# Patient Record
Sex: Female | Born: 1985 | Race: Black or African American | Hispanic: No | Marital: Single | Smoking: Light tobacco smoker
Health system: Southern US, Community
[De-identification: ages and names within clinical notes are randomized; demographics above are authoritative.]

## PROBLEM LIST (undated history)

## (undated) ENCOUNTER — Inpatient Hospital Stay (HOSPITAL_COMMUNITY): Payer: Self-pay

## (undated) DIAGNOSIS — D219 Benign neoplasm of connective and other soft tissue, unspecified: Secondary | ICD-10-CM

## (undated) DIAGNOSIS — J45909 Unspecified asthma, uncomplicated: Secondary | ICD-10-CM

## (undated) DIAGNOSIS — N809 Endometriosis, unspecified: Secondary | ICD-10-CM

## (undated) DIAGNOSIS — D279 Benign neoplasm of unspecified ovary: Secondary | ICD-10-CM

## (undated) DIAGNOSIS — L309 Dermatitis, unspecified: Secondary | ICD-10-CM

## (undated) HISTORY — DX: Endometriosis, unspecified: N80.9

## (undated) HISTORY — DX: Benign neoplasm of connective and other soft tissue, unspecified: D21.9

## (undated) HISTORY — DX: Unspecified asthma, uncomplicated: J45.909

## (undated) HISTORY — PX: OVARIAN CYST REMOVAL: SHX89

## (undated) HISTORY — DX: Benign neoplasm of unspecified ovary: D27.9

## (undated) HISTORY — PX: WISDOM TOOTH EXTRACTION: SHX21

---
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
## 2006-05-23 DIAGNOSIS — D279 Benign neoplasm of unspecified ovary: Secondary | ICD-10-CM

## 2006-05-23 HISTORY — DX: Benign neoplasm of unspecified ovary: D27.9

## 2006-05-23 HISTORY — PX: OVARIAN CYST SURGERY: SHX726

## 2011-07-11 ENCOUNTER — Emergency Department (HOSPITAL_COMMUNITY)
Admission: EM | Admit: 2011-07-11 | Discharge: 2011-07-11 | Disposition: A | Discharge status: Home / Self Care | Payer: Self-pay | Attending: Emergency Medicine | Admitting: Emergency Medicine

## 2011-07-11 ENCOUNTER — Encounter (HOSPITAL_COMMUNITY): Payer: Self-pay | Admitting: Emergency Medicine

## 2011-07-11 DIAGNOSIS — J45909 Unspecified asthma, uncomplicated: Secondary | ICD-10-CM | POA: Insufficient documentation

## 2011-07-11 DIAGNOSIS — F17200 Nicotine dependence, unspecified, uncomplicated: Secondary | ICD-10-CM | POA: Insufficient documentation

## 2011-07-11 DIAGNOSIS — R112 Nausea with vomiting, unspecified: Secondary | ICD-10-CM | POA: Insufficient documentation

## 2011-07-11 LAB — URINALYSIS, ROUTINE W REFLEX MICROSCOPIC
Ketones, ur: NEGATIVE mg/dL
Protein, ur: NEGATIVE mg/dL
Urobilinogen, UA: 0.2 mg/dL (ref 0.0–1.0)

## 2011-07-11 LAB — URINE MICROSCOPIC-ADD ON

## 2011-07-11 LAB — PREGNANCY, URINE: Preg Test, Ur: NEGATIVE

## 2011-07-11 MED ORDER — ONDANSETRON 8 MG PO TBDP: 8.0000 mg | ORAL_TABLET | Freq: Three times a day (TID) | ORAL | Status: AC | PRN

## 2011-07-11 MED ORDER — PROMETHAZINE HCL 25 MG PO TABS: 25.0000 mg | ORAL_TABLET | Freq: Four times a day (QID) | ORAL | Status: AC | PRN

## 2011-07-11 MED ORDER — SODIUM CHLORIDE 0.9 % IV BOLUS (SEPSIS)
1000.0000 mL | Freq: Once | INTRAVENOUS | Status: AC
  Administered 2011-07-11: 1000 mL via INTRAVENOUS

## 2011-07-11 MED ORDER — ONDANSETRON HCL 4 MG/2ML IJ SOLN
4.0000 mg | Freq: Once | INTRAMUSCULAR | Status: AC
  Administered 2011-07-11: 4 mg via INTRAVENOUS
  Filled 2011-07-11: qty 2

## 2011-07-11 NOTE — ED Provider Notes (Signed)
History     CSN: [id number]  Arrival date & time 07/11/11  8295   First MD Initiated Contact with Patient 07/11/11 [phone]      Chief Complaint  Patient presents with  . Emesis    (Consider location/radiation/quality/duration/timing/severity/associated sxs/prior treatment) Patient is a 26 y.o. female presenting with vomiting. The history is provided by the patient.  Emesis  This is a new problem. Associated symptoms include abdominal pain. Pertinent negatives include no diarrhea and no headaches.   patient had nausea and vomiting it began last night. No diarrhea. Upper abdominal pain. She states she drank one drink last night. No fevers. No recent sick contacts. No fevers. When asked if she could be pregnant she stated "I hope not". She states she's had one episode like this in the past, but resolved on its own. No difficulty breathing. No headache. No confusion.  Past Medical History  Diagnosis Date  . Asthma     No past surgical history on file.  No family history on file.  History  Substance Use Topics  . Smoking status: Current Everyday Smoker -- 0.5 packs/day    Types: Cigarettes  . Smokeless tobacco: Not on file  . Alcohol Use: Yes    OB History    Grav Para Term Preterm Abortions TAB SAB Ect Mult Living                  Review of Systems  Constitutional: Negative for activity change and appetite change.  HENT: Negative for neck stiffness.   Eyes: Negative for pain.  Respiratory: Negative for chest tightness and shortness of breath.   Cardiovascular: Negative for chest pain and leg swelling.  Gastrointestinal: Positive for nausea, vomiting and abdominal pain. Negative for diarrhea.  Genitourinary: Negative for flank pain.  Musculoskeletal: Negative for back pain.  Skin: Negative for rash.  Neurological: Negative for weakness, numbness and headaches.  Psychiatric/Behavioral: Negative for behavioral problems.    Allergies  Peanut-containing drug  products  Home Medications   Current Outpatient Rx  Name Route Sig Dispense Refill  . ONDANSETRON 8 MG PO TBDP Oral Take 1 tablet (8 mg total) by mouth every 8 (eight) hours as needed for nausea. 10 tablet 0  . PROMETHAZINE HCL 25 MG PO TABS Oral Take 1 tablet (25 mg total) by mouth every 6 (six) hours as needed for nausea. 20 tablet 0    BP 110/67  Pulse 53  Temp(Src) 97 F (36.1 C) (Oral)  Resp 16  SpO2 100%  LMP 07/11/2011  Physical Exam  Nursing note and vitals reviewed. Constitutional: She is oriented to person, place, and time. She appears well-developed and well-nourished.  HENT:  Head: Normocephalic.  Neck: Normal range of motion. Neck supple.  Cardiovascular: Normal rate, regular rhythm and normal heart sounds.   No murmur heard. Pulmonary/Chest: Effort normal and breath sounds normal. No respiratory distress. She has no wheezes. She has no rales.  Abdominal: Soft. Bowel sounds are normal. She exhibits no distension. There is no tenderness. There is no rebound and no guarding.       Patient is actively vomiting  Musculoskeletal: Normal range of motion.  Neurological: She is alert and oriented to person, place, and time. No cranial nerve deficit.  Skin: Skin is warm and dry.  Psychiatric: She has a normal mood and affect. Her speech is normal.    ED Course  Procedures (including critical care time)  Labs Reviewed  URINALYSIS, ROUTINE W REFLEX MICROSCOPIC - Abnormal; Notable for  the following:    APPearance TURBID (*)    Hgb urine dipstick LARGE (*)    Leukocytes, UA SMALL (*)    All other components within normal limits  URINE MICROSCOPIC-ADD ON - Abnormal; Notable for the following:    Squamous Epithelial / LPF MANY (*)    Bacteria, UA FEW (*)    All other components within normal limits  PREGNANCY, URINE   No results found.   1. Nausea and vomiting       MDM  Nausea vomiting. Patient feels better after IV fluids and Zofran. She's tolerated orals  here. She's not pregnant. 61 onset.electrolyte abnormalities at this time. She be discharged home        Juliet Rude. Rubin Payor, MD 07/11/11 1141

## 2011-07-11 NOTE — ED Notes (Signed)
Patient states feeling much better.  Patient denies pain/nausea at this time.

## 2011-07-11 NOTE — ED Notes (Signed)
Patient states started throwing up last night.

## 2011-07-11 NOTE — ED Notes (Signed)
Patient ambulated to restroom.  Patient tolerated well.  Patient states cannot provide a urine specimen at this time.

## 2011-07-11 NOTE — Discharge Instructions (Signed)
Nausea and Vomiting Nausea is a sick feeling that often comes before throwing up (vomiting). Vomiting is a reflex where stomach contents come out of your mouth. Vomiting can cause severe loss of body fluids (dehydration). Children and elderly adults can become dehydrated quickly, especially if they also have diarrhea. Nausea and vomiting are symptoms of a condition or disease. It is important to find the cause of your symptoms. CAUSES   Direct irritation of the stomach lining. This irritation can result from increased acid production (gastroesophageal reflux disease), infection, food poisoning, taking certain medicines (such as nonsteroidal anti-inflammatory drugs), alcohol use, or tobacco use.   Signals from the brain.These signals could be caused by a headache, heat exposure, an inner ear disturbance, increased pressure in the brain from injury, infection, a tumor, or a concussion, pain, emotional stimulus, or metabolic problems.   An obstruction in the gastrointestinal tract (bowel obstruction).   Illnesses such as diabetes, hepatitis, gallbladder problems, appendicitis, kidney problems, cancer, sepsis, atypical symptoms of a heart attack, or eating disorders.   Medical treatments such as chemotherapy and radiation.   Receiving medicine that makes you sleep (general anesthetic) during surgery.  DIAGNOSIS Your caregiver may ask for tests to be done if the problems do not improve after a few days. Tests may also be done if symptoms are severe or if the reason for the nausea and vomiting is not clear. Tests may include:  Urine tests.   Blood tests.   Stool tests.   Cultures (to look for evidence of infection).   X-rays or other imaging studies.  Test results can help your caregiver make decisions about treatment or the need for additional tests. TREATMENT You need to stay well hydrated. Drink frequently but in small amounts.You may wish to drink water, sports drinks, clear broth, or  eat frozen ice pops or gelatin dessert to help stay hydrated.When you eat, eating slowly may help prevent nausea.There are also some antinausea medicines that may help prevent nausea. HOME CARE INSTRUCTIONS   Take all medicine as directed by your caregiver.   If you do not have an appetite, do not force yourself to eat. However, you must continue to drink fluids.   If you have an appetite, eat a normal diet unless your caregiver tells you differently.   Eat a variety of complex carbohydrates (rice, wheat, potatoes, bread), lean meats, yogurt, fruits, and vegetables.   Avoid high-fat foods because they are more difficult to digest.   Drink enough water and fluids to keep your urine clear or pale yellow.   If you are dehydrated, ask your caregiver for specific rehydration instructions. Signs of dehydration may include:   Severe thirst.   Dry lips and mouth.   Dizziness.   Dark urine.   Decreasing urine frequency and amount.   Confusion.   Rapid breathing or pulse.  SEEK IMMEDIATE MEDICAL CARE IF:   You have blood or brown flecks (like coffee grounds) in your vomit.   You have black or bloody stools.   You have a severe headache or stiff neck.   You are confused.   You have severe abdominal pain.   You have chest pain or trouble breathing.   You do not urinate at least once every 8 hours.   You develop cold or clammy skin.   You continue to vomit for longer than 24 to 48 hours.   You have a fever.  MAKE SURE YOU:   Understand these instructions.   Will watch your   condition.   Will get help right away if you are not doing well or get worse.  Document Released: 05/09/2005 Document Revised: 01/19/2011 Document Reviewed: 10/06/2010 [employer] Patient Information 2012 [employer]. 

## 2011-09-20 ENCOUNTER — Emergency Department (HOSPITAL_COMMUNITY)
Admission: EM | Admit: 2011-09-20 | Discharge: 2011-09-20 | Disposition: A | Discharge status: Home / Self Care | Payer: Self-pay | Attending: Emergency Medicine | Admitting: Emergency Medicine

## 2011-09-20 ENCOUNTER — Encounter (HOSPITAL_COMMUNITY): Payer: Self-pay | Admitting: *Deleted

## 2011-09-20 DIAGNOSIS — F17200 Nicotine dependence, unspecified, uncomplicated: Secondary | ICD-10-CM | POA: Insufficient documentation

## 2011-09-20 DIAGNOSIS — J309 Allergic rhinitis, unspecified: Secondary | ICD-10-CM | POA: Insufficient documentation

## 2011-09-20 DIAGNOSIS — J45909 Unspecified asthma, uncomplicated: Secondary | ICD-10-CM | POA: Insufficient documentation

## 2011-09-20 DIAGNOSIS — J302 Other seasonal allergic rhinitis: Secondary | ICD-10-CM

## 2011-09-20 MED ORDER — ALBUTEROL SULFATE HFA 108 (90 BASE) MCG/ACT IN AERS: 1.0000 | INHALATION_SPRAY | Freq: Four times a day (QID) | RESPIRATORY_TRACT | Status: DC | PRN

## 2011-09-20 MED ORDER — CETIRIZINE HCL 10 MG PO TABS: 10.0000 mg | ORAL_TABLET | Freq: Every day | ORAL | Status: DC

## 2011-09-20 NOTE — ED Notes (Signed)
Patient developed a raspy voice 2 days ago and chest congestion clear sputum intermittent cough continued today. Airway intact bilateral equal chest rise and fall.  No distress noted currently sitting resting comfortably on stretcher. States has albuterol inhaler however ran out.

## 2011-09-20 NOTE — ED Provider Notes (Signed)
Medical screening examination/treatment/procedure(s) were performed by non-physician practitioner and as supervising physician I was immediately available for consultation/collaboration.   Glynn Octave, MD 09/20/11 [phone]

## 2011-09-20 NOTE — Discharge Instructions (Signed)
Start taking zyrtec daily and use albuterol inhaler as needed. Rest and stay well hydrated. Establishment with a Primary Care provider is Very important for general health care concerns, minor illness and minor injury and for further evaluation and management of your asthma but return to the ER for changing or worsening of symptoms.   Asthma Attack Prevention HOW CAN ASTHMA BE PREVENTED? Currently, there is no way to prevent asthma from starting. However, you can take steps to control the disease and prevent its symptoms after you have been diagnosed. Learn about your asthma and how to control it. Take an active role to control your asthma by working with your caregiver to create and follow an asthma action plan. An asthma action plan guides you in taking your medicines properly, avoiding factors that make your asthma worse, tracking your level of asthma control, responding to worsening asthma, and seeking emergency care when needed. To track your asthma, keep records of your symptoms, check your peak flow number using a peak flow meter (handheld device that shows how well air moves out of your lungs), and get regular asthma checkups.  Other ways to prevent asthma attacks include:  Use medicines as your caregiver directs.   Identify and avoid things that make your asthma worse (as much as you can).   Keep track of your asthma symptoms and level of control.   Get regular checkups for your asthma.   With your caregiver, write a detailed plan for taking medicines and managing an asthma attack. Then be sure to follow your action plan. Asthma is an ongoing condition that needs regular monitoring and treatment.   Identify and avoid asthma triggers. A number of outdoor allergens and irritants (pollen, mold, cold air, air pollution) can trigger asthma attacks. Find out what causes or makes your asthma worse, and take steps to avoid those triggers (see below).   Monitor your breathing. Learn to recognize  warning signs of an attack, such as slight coughing, wheezing or shortness of breath. However, your lung function may already decrease before you notice any signs or symptoms, so regularly measure and record your peak airflow with a home peak flow meter.   Identify and treat attacks early. If you act quickly, you're less likely to have a severe attack. You will also need less medicine to control your symptoms. When your peak flow measurements decrease and alert you to an upcoming attack, take your medicine as instructed, and immediately stop any activity that may have triggered the attack. If your symptoms do not improve, get medical help.   Pay attention to increasing quick-relief inhaler use. If you find yourself relying on your quick-relief inhaler (such as albuterol), your asthma is not under control. See your caregiver about adjusting your treatment.  IDENTIFY AND CONTROL FACTORS THAT MAKE YOUR ASTHMA WORSE A number of common things can set off or make your asthma symptoms worse (asthma triggers). Keep track of your asthma symptoms for several weeks, detailing all the environmental and emotional factors that are linked with your asthma. When you have an asthma attack, go back to your asthma diary to see which factor, or combination of factors, might have contributed to it. Once you know what these factors are, you can take steps to control many of them.  Allergies: If you have allergies and asthma, it is important to take asthma prevention steps at home. Asthma attacks (worsening of asthma symptoms) can be triggered by allergies, which can cause temporary increased inflammation of your airways.  Minimizing contact with the substance to which you are allergic will help prevent an asthma attack. Animal Dander:   Some people are allergic to the flakes of skin or dried saliva from animals with fur or feathers. Keep these pets out of your home.   If you can't keep a pet outdoors, keep the pet out of your  bedroom and other sleeping areas at all times, and keep the door closed.   Remove carpets and furniture covered with cloth from your home. If that is not possible, keep the pet away from fabric-covered furniture and carpets.  Dust Mites:  Many people with asthma are allergic to dust mites. Dust mites are tiny bugs that are found in every home, in mattresses, pillows, carpets, fabric-covered furniture, bedcovers, clothes, stuffed toys, fabric, and other fabric-covered items.   Cover your mattress in a special dust-proof cover.   Cover your pillow in a special dust-proof cover, or wash the pillow each week in hot water. Water must be hotter than 130 F to kill dust mites. Cold or warm water used with detergent and bleach can also be effective.   Wash the sheets and blankets on your bed each week in hot water.   Try not to sleep or lie on cloth-covered cushions.   Call ahead when traveling and ask for a smoke-free hotel room. Bring your own bedding and pillows, in case the hotel only supplies feather pillows and down comforters, which may contain dust mites and cause asthma symptoms.   Remove carpets from your bedroom and those laid on concrete, if you can.   Keep stuffed toys out of the bed, or wash the toys weekly in hot water or cooler water with detergent and bleach.  Cockroaches:  Many people with asthma are allergic to the droppings and remains of cockroaches.   Keep food and garbage in closed containers. Never leave food out.   Use poison baits, traps, powders, gels, or paste (for example, boric acid).   If a spray is used to kill cockroaches, stay out of the room until the odor goes away.  Indoor Mold:  Fix leaky faucets, pipes, or other sources of water that have mold around them.   Clean moldy surfaces with a cleaner that has bleach in it.  Pollen and Outdoor Mold:  When pollen or mold spore counts are high, try to keep your windows closed.   Stay indoors with windows  closed from late morning to afternoon, if you can. Pollen and some mold spore counts are highest at that time.   Ask your caregiver whether you need to take or increase anti-inflammatory medicine before your allergy season starts.  Irritants:   Tobacco smoke is an irritant. If you smoke, ask your caregiver how you can quit. Ask family members to quit smoking, too. Do not allow smoking in your home or car.   If possible, do not use a wood-burning stove, kerosene heater, or fireplace. Minimize exposure to all sources of smoke, including incense, candles, fires, and fireworks.   Try to stay away from strong odors and sprays, such as perfume, talcum powder, hair spray, and paints.   Decrease humidity in your home and use an indoor air cleaning device. Reduce indoor humidity to below 60 percent. Dehumidifiers or central air conditioners can do this.   Try to have someone else vacuum for you once or twice a week, if you can. Stay out of rooms while they are being vacuumed and for a short while afterward.  If you vacuum, use a dust mask from a hardware store, a double-layered or microfilter vacuum cleaner bag, or a vacuum cleaner with a HEPA filter.   Sulfites in foods and beverages can be irritants. Do not drink beer or wine, or eat dried fruit, processed potatoes, or shrimp if they cause asthma symptoms.   Cold air can trigger an asthma attack. Cover your nose and mouth with a scarf on cold or windy days.   Several health conditions can make asthma more difficult to manage, including runny nose, sinus infections, reflux disease, psychological stress, and sleep apnea. Your caregiver will treat these conditions, as well.   Avoid close contact with people who have a cold or the flu, since your asthma symptoms may get worse if you catch the infection from them. Wash your hands thoroughly after touching items that may have been handled by people with a respiratory infection.   Get a flu shot every  year to protect against the flu virus, which often makes asthma worse for days or weeks. Also get a pneumonia shot once every five to 10 years.  Drugs:  Aspirin and other painkillers can cause asthma attacks. 10% to 20% of people with asthma have sensitivity to aspirin or a group of painkillers called non-steroidal anti-inflammatory drugs (NSAIDS), such as ibuprofen and naproxen. These drugs are used to treat pain and reduce fevers. Asthma attacks caused by any of these medicines can be severe and even fatal. These drugs must be avoided in people who have known aspirin sensitive asthma. Products with acetaminophen are considered safe for people who have asthma. It is important that people with aspirin sensitivity read labels of all over-the-counter drugs used to treat pain, colds, coughs, and fever.   Beta blockers and ACE inhibitors are other drugs which you should discuss with your caregiver, in relation to your asthma.  ALLERGY SKIN TESTING  Ask your asthma caregiver about allergy skin testing or blood testing (RAST test) to identify the allergens to which you are sensitive. If you are found to have allergies, allergy shots (immunotherapy) for asthma may help prevent future allergies and asthma. With allergy shots, small doses of allergens (substances to which you are allergic) are injected under your skin on a regular schedule. Over a period of time, your body may become used to the allergen and less responsive with asthma symptoms. You can also take measures to minimize your exposure to those allergens. EXERCISE  If you have exercise-induced asthma, or are planning vigorous exercise, or exercise in cold, humid, or dry environments, prevent exercise-induced asthma by following your caregiver's advice regarding asthma treatment before exercising. Document Released: 04/27/2009 Document Revised: 04/28/2011 Document Reviewed: 04/27/2009 [facility] Patient Information 2012 [place], [place].  Allergies,  Generic Allergies may happen from anything your body is sensitive to. This may be food, medicines, pollens, chemicals, and nearly anything around you in everyday life that produces allergens. An allergen is anything that causes an allergy producing substance. Heredity is often a factor in causing these problems. This means you may have some of the same allergies as your parents. Food allergies happen in all age groups. Food allergies are some of the most severe and life threatening. Some common food allergies are cow's milk, seafood, eggs, nuts, wheat, and soybeans. SYMPTOMS   Swelling around the mouth.   An itchy red rash or hives.   Vomiting or diarrhea.   Difficulty breathing.  SEVERE ALLERGIC REACTIONS ARE LIFE-THREATENING. This reaction is called anaphylaxis. It can cause the  mouth and throat to swell and cause difficulty with breathing and swallowing. In severe reactions only a trace amount of food (for example, peanut oil in a salad) may cause death within seconds. Seasonal allergies occur in all age groups. These are seasonal because they usually occur during the same season every year. They may be a reaction to molds, grass pollens, or tree pollens. Other causes of problems are house dust mite allergens, pet dander, and mold spores. The symptoms often consist of nasal congestion, a runny itchy nose associated with sneezing, and tearing itchy eyes. There is often an associated itching of the mouth and ears. The problems happen when you come in contact with pollens and other allergens. Allergens are the particles in the air that the body reacts to with an allergic reaction. This causes you to release allergic antibodies. Through a chain of events, these eventually cause you to release histamine into the blood stream. Although it is meant to be protective to the body, it is this release that causes your discomfort. This is why you were given anti-histamines to feel better. If you are unable to  pinpoint the offending allergen, it may be determined by skin or blood testing. Allergies cannot be cured but can be controlled with medicine. Hay fever is a collection of all or some of the seasonal allergy problems. It may often be treated with simple over-the-counter medicine such as diphenhydramine. Take medicine as directed. Do not drink alcohol or drive while taking this medicine. Check with your caregiver or package insert for child dosages. If these medicines are not effective, there are many new medicines your caregiver can prescribe. Stronger medicine such as nasal spray, eye drops, and corticosteroids may be used if the first things you try do not work well. Other treatments such as immunotherapy or desensitizing injections can be used if all else fails. Follow up with your caregiver if problems continue. These seasonal allergies are usually not life threatening. They are generally more of a nuisance that can often be handled using medicine. HOME CARE INSTRUCTIONS   If unsure what causes a reaction, keep a diary of foods eaten and symptoms that follow. Avoid foods that cause reactions.   If hives or rash are present:   Take medicine as directed.   You may use an over-the-counter antihistamine (diphenhydramine) for hives and itching as needed.   Apply cold compresses (cloths) to the skin or take baths in cool water. Avoid hot baths or showers. Heat will make a rash and itching worse.   If you are severely allergic:   Following a treatment for a severe reaction, hospitalization is often required for closer follow-up.   Wear a medic-alert bracelet or necklace stating the allergy.   You and your family must learn how to give adrenaline or use an anaphylaxis kit.   If you have had a severe reaction, always carry your anaphylaxis kit or EpiPen with you. Use this medicine as directed by your caregiver if a severe reaction is occurring. Failure to do so could have a fatal outcome.  SEEK  MEDICAL CARE IF:  You suspect a food allergy. Symptoms generally happen within 30 minutes of eating a food.   Your symptoms have not gone away within 2 days or are getting worse.   You develop new symptoms.   You want to retest yourself or your child with a food or drink you think causes an allergic reaction. Never do this if an anaphylactic reaction to that food or  drink has happened before. Only do this under the care of a caregiver.  SEEK IMMEDIATE MEDICAL CARE IF:   You have difficulty breathing, are wheezing, or have a tight feeling in your chest or throat.   You have a swollen mouth, or you have hives, swelling, or itching all over your body.   You have had a severe reaction that has responded to your anaphylaxis kit or an EpiPen. These reactions may return when the medicine has worn off. These reactions should be considered life threatening.  MAKE SURE YOU:   Understand these instructions.   Will watch your condition.   Will get help right away if you are not doing well or get worse.  Document Released: 08/02/2002 Document Revised: 04/28/2011 Document Reviewed: 01/07/2008 [facility] Patient Information 2012 [place], [place].

## 2011-09-20 NOTE — ED Provider Notes (Signed)
History     CSN: [id number]  Arrival date & time 09/20/11  0906   First MD Initiated Contact with Patient 09/20/11 [phone]      Chief Complaint  Patient presents with  . Chest Pain  . Wheezing    (Consider location/radiation/quality/duration/timing/severity/associated sxs/prior treatment) HPI  Patient presents to emergency department complaining of asthma. Patient states that she woke yesterday morning and felt a fleeting tightness in her chest that was followed by onset of wheezing and cough. Patient states she took a puff of an old inhaler with relief of symptoms as well as inhaled hot moist air from a hot wet rag which improved her symptoms. However patient states that she woke again this morning and had similar recurrent symptoms of acute fleeting tightness in her chest followed by wheezing and cough. Patient states that the old inhaler is out of medication. Patient states she has a history of seasonal allergies that tend to flare her asthma and has taken Zyrtec in the past but is not currently taking it. Patient states that she has been overly stressed at work, working long hours, and she feels "my body is just worn out." She denies any fevers, chills, hemoptysis, lower extremity pain or swelling, exogenous estrogen use, history of PE or DVT, bleeding or clotting disorders. Patient states pain is consistent with past history of asthma attacks. Symptoms are acute onset, intermittent. Patient states that currently her breathing is "pretty good." She states that the symptoms tend flare more so in the mornings.  Past Medical History  Diagnosis Date  . Asthma     History reviewed. No pertinent past surgical history.  No family history on file.  History  Substance Use Topics  . Smoking status: Current Everyday Smoker -- 0.5 packs/day    Types: Cigarettes  . Smokeless tobacco: Not on file  . Alcohol Use: No    OB History    Grav Para Term Preterm Abortions TAB SAB Ect Mult Living                 Review of Systems  All other systems reviewed and are negative.    Allergies  Peanut-containing drug products  Home Medications   Current Outpatient Rx  Name Route Sig Dispense Refill  . CETIRIZINE HCL 10 MG PO TABS Oral Take 10 mg by mouth daily.    . ALBUTEROL SULFATE HFA 108 (90 BASE) MCG/ACT IN AERS Inhalation Inhale 1-2 puffs into the lungs every 6 (six) hours as needed for wheezing. 1 Inhaler 1  . CETIRIZINE HCL 10 MG PO TABS Oral Take 1 tablet (10 mg total) by mouth daily. 30 tablet 0    BP 112/78  Pulse 77  Temp(Src) 98.2 F (36.8 C) (Oral)  Resp 20  SpO2 100%  Physical Exam  Vitals reviewed. Constitutional: She is oriented to person, place, and time. She appears well-developed and well-nourished. No distress.  HENT:  Head: Normocephalic and atraumatic.  Right Ear: External ear normal.  Left Ear: External ear normal.  Nose: Nose normal.  Mouth/Throat: No oropharyngeal exudate.       Mild erythema of posterior pharynx and tonsils no tonsillar exudate or enlargement. Patent airway. Swallowing secretions well  Eyes: Conjunctivae are normal.  Neck: Normal range of motion. Neck supple.  Cardiovascular: Normal rate, regular rhythm and normal heart sounds.  Exam reveals no gallop and no friction rub.   No murmur heard. Pulmonary/Chest: Effort normal and breath sounds normal. No respiratory distress. She has no wheezes. She  has no rales. She exhibits no tenderness.  Abdominal: Soft. She exhibits no distension and no mass. There is no tenderness. There is no rebound and no guarding.  Musculoskeletal: She exhibits no edema and no tenderness.  Lymphadenopathy:    She has no cervical adenopathy.  Neurological: She is alert and oriented to person, place, and time. She has normal reflexes.  Skin: Skin is warm and dry. No rash noted. She is not diaphoretic.  Psychiatric: She has a normal mood and affect.    ED Course  Procedures (including critical care  time)  Labs Reviewed - No data to display No results found.   1. Asthma   2. Seasonal allergies       MDM  No worrisome signs or symptoms of pulmonary embolism or acute coronary syndrome with patient at  low-risk with symptoms consistent with asthma. I spoke at length with patient about the need for primary care provider establishment for future management of asthma. She voices her understanding is agreeable with plan. Spoke at length about changing or worsening symptoms that would warrent return to emergency department. Patient voices her understanding and is agreeable to plan. Patient was told about the importance of tobacco cessation.        Jenness Corner, [place] 09/20/11 1005

## 2011-09-20 NOTE — ED Notes (Signed)
Pt is here after having a sharp pain in chest with deep breath yesterday and it lasted 1.5 minutes.  Pt reports asthma flare up.  Pt has complained of cough.  No distress.

## 2011-11-17 ENCOUNTER — Emergency Department (HOSPITAL_COMMUNITY): Payer: Self-pay

## 2011-11-17 ENCOUNTER — Encounter (HOSPITAL_COMMUNITY): Payer: Self-pay | Admitting: Emergency Medicine

## 2011-11-17 ENCOUNTER — Emergency Department (HOSPITAL_COMMUNITY)
Admission: EM | Admit: 2011-11-17 | Discharge: 2011-11-17 | Disposition: A | Discharge status: Home / Self Care | Payer: Self-pay | Attending: Emergency Medicine | Admitting: Emergency Medicine

## 2011-11-17 DIAGNOSIS — F17200 Nicotine dependence, unspecified, uncomplicated: Secondary | ICD-10-CM | POA: Insufficient documentation

## 2011-11-17 DIAGNOSIS — J45901 Unspecified asthma with (acute) exacerbation: Secondary | ICD-10-CM | POA: Insufficient documentation

## 2011-11-17 DIAGNOSIS — Z91010 Allergy to peanuts: Secondary | ICD-10-CM | POA: Insufficient documentation

## 2011-11-17 MED ORDER — PREDNISONE 20 MG PO TABS
60.0000 mg | ORAL_TABLET | Freq: Once | ORAL | Status: AC
  Administered 2011-11-17: 60 mg via ORAL
  Filled 2011-11-17: qty 3

## 2011-11-17 MED ORDER — ALBUTEROL SULFATE (5 MG/ML) 0.5% IN NEBU
5.0000 mg | INHALATION_SOLUTION | Freq: Once | RESPIRATORY_TRACT | Status: AC
  Administered 2011-11-17: 5 mg via RESPIRATORY_TRACT
  Filled 2011-11-17: qty 1

## 2011-11-17 MED ORDER — ALBUTEROL SULFATE HFA 108 (90 BASE) MCG/ACT IN AERS: 1.0000 | INHALATION_SPRAY | Freq: Four times a day (QID) | RESPIRATORY_TRACT | Status: DC | PRN

## 2011-11-17 MED ORDER — ALBUTEROL SULFATE HFA 108 (90 BASE) MCG/ACT IN AERS
2.0000 | INHALATION_SPRAY | RESPIRATORY_TRACT | Status: DC | PRN
  Administered 2011-11-17: 2 via RESPIRATORY_TRACT
  Filled 2011-11-17: qty 6.7

## 2011-11-17 MED ORDER — PREDNISONE 20 MG PO TABS: 60.0000 mg | ORAL_TABLET | Freq: Every day | ORAL | Status: AC

## 2011-11-17 NOTE — Discharge Instructions (Signed)
Asthma, Acute Bronchospasm Your exam shows you have asthma, or acute bronchospasm that acts like asthma. Bronchospasm means your air passages become narrowed. These conditions are due to inflammation and airway spasm that cause narrowing of the bronchial tubes in the lungs. This causes you to have wheezing and shortness of breath. CAUSES  Respiratory infections and allergies most often bring on these attacks. Smoking, air pollution, cold air, emotional upsets, and vigorous exercise can also bring them on.  TREATMENT   Treatment is aimed at making the narrowed airways larger. Mild asthma/bronchospasm is usually controlled with inhaled medicines. Albuterol is a common medicine that you breathe in to open spastic or narrowed airways. Some trade names for albuterol are Ventolin or Proventil. Steroid medicine is also used to reduce the inflammation when an attack is moderate or severe. Antibiotics (medications used to kill germs) are only used if a bacterial infection is present.   If you are pregnant and need to use Albuterol (Ventolin or Proventil), you can expect the baby to move more than usual shortly after the medicine is used.  HOME CARE INSTRUCTIONS   Rest.   Drink plenty of liquids. This helps the mucus to remain thin and easily coughed up. Do not use caffeine or alcohol.   Do not smoke. Avoid being exposed to second-hand smoke.   You play a critical role in keeping yourself in good health. Avoid exposure to things that cause you to wheeze. Avoid exposure to things that cause you to have breathing problems. Keep your medications up-to-date and available. Carefully follow your doctor's treatment plan.   When pollen or pollution is bad, keep windows closed and use an air conditioner go to places with air conditioning. If you are allergic to furry pets or birds, find new homes for them or keep them outside.   Take your medicine exactly as prescribed.   Asthma requires careful medical  attention. See your caregiver for follow-up as advised. If you are more than [redacted] weeks pregnant and you were prescribed any new medications, let your Obstetrician know about the visit and how you are doing. Arrange a recheck.  SEEK IMMEDIATE MEDICAL CARE IF:   You are getting worse.   You have trouble breathing. If severe, call 911.   You develop chest pain or discomfort.   You are throwing up or not drinking fluids.   You are not getting better within 24 hours.   You are coughing up yellow, green, brown, or bloody sputum.   You develop a fever over 102 F (38.9 C).   You have trouble swallowing.  MAKE SURE YOU:   Understand these instructions.   Will watch your condition.   Will get help right away if you are not doing well or get worse.    RESOURCE GUIDE  Chronic Pain Problems: Contact [facility]  [phone] Patients need to be referred by their primary care doctor.  Insufficient Money for Medicine: Contact United Way:  call "211" or [facility] 5481036621.  No Primary Care Doctor: - Call [facility]  [phone] - can help you locate a primary care doctor that  accepts your insurance, provides certain services, etc. - Physician Referral Service- [phone]  Agencies that provide inexpensive medical care: - [facility]  [phone] - [facility]  [phone] - [facility]  [phone] [facility]  [phone] - [facility]  [phone] [facility]  [phone]  Medicaid-accepting [facility]  Providers: - [facility]- [address]  [phone], Mon-Fri 9am-7pm, Sat 9am-1pm - [facility]- [address] [place], [place]  [phone] - [facility]- [address], [address] [place]  [phone] [facility]- [address]  [phone] - Renaye Rakers- [address] [place], [address],  [phone]  Only accepts [place] patients after they have their name  applied to their card  Self Pay (no insurance) in [place]: - Sickle Cell Patients: Dr Willey Blade, [facility]  [address] [place], [phone] - [facility]- [address] [place]  [phone]       [facility] [place]- [address]       -     [facility]- see information above (Speak to [employer] if you do not have insurance)       -  [facility]- [address] [place], [phone]       -  [facility]- [address],  [phone]       -  [facility]- [address] [phone]       -  Dr Julio Sicks-  [address], [place], [phone]       -  [facility]- [address] [phone]       -  [facility]- [address], [phone], also [address], [phone]       -    [facility]- [address] [place], [phone], 1st & 3rd Saturday   every month, 10am-1pm  1) Find a Doctor and Pay Out of Pocket Although you won't have to find out who is covered by your insurance plan, it is a good idea to ask around and get recommendations. You will then need to call the office and see if the doctor you have chosen will accept you as a new patient and what types of options they offer for patients who are self-pay. Some doctors offer discounts or will set up payment plans for their patients who do not have insurance, but you will need to ask so you aren't surprised when you get to your appointment.  2) Contact Your Local Health Department Not all health departments have doctors that can see patients for sick visits, but many do, so it is worth a call to see if yours does. If you don't know where your local health department is, you can check in your phone book. The CDC also has a tool to help you locate your state's health department, and many state websites also have  listings of all of their local health departments.  3) Find a Walk-in Clinic If your illness is not likely to be very severe or complicated, you may want to try a walk in clinic. These are popping up all over the country in pharmacies, drugstores, and shopping centers. They're usually staffed by nurse practitioners or physician assistants that have been trained to treat common illnesses and complaints. They're usually fairly quick and inexpensive. However, if you have serious medical issues or chronic medical problems, these are probably not your best option  STD Testing - [facility] Department of [facility] [place], STD Clinic, [address] [place], phone [phone] or [phone].  Monday - Friday, call for an appointment. -  [facility] Department of [employer], STD Clinic, [address], [place], phone [phone] or [phone].  Monday - Friday, call for an appointment.  Abuse/Neglect: [facility] Child Abuse Hotline 608-344-3338 [facility] Child Abuse Hotline [phone] (After Hours)  Emergency Shelter:  [facility] ([phone])[phone]  Maternity Homes: - [facility] [place] [facility] [phone] - [facility] [phone]  MRSA Hotline #:   660-102-8519  [facility] Resources  [facility]  [employer] [facility] Dept. [address]                 [address]         [address] [place] [address]  Blondell Reveal Phone:  [phone]                                  Phone:  [phone]                   Phone:  [phone]  [facility], [phone] - [facility] - [phone]       -     [facility] in [place], [place], [address],                                  [phone],  [facility] Child Abuse Hotline 713-167-9794 or 320-566-4274 (After Hours)   Behavioral Health Services  Substance Abuse Resources: - [facility] Drug Services  575-546-3938 - [facility] [phone] - [facility] [place] [phone] Floydene Flock 712-180-1008 - Residential & Outpatient Substance Abuse Program  [phone]  Psychological Services: [facility]  [phone] Services  ([phone] - [employer], [phone] [place]. [address], [place], ACCESS LINE: 647-216-0865 or 4841507009, [employer]  Dental Assistance  If unable to pay or uninsured, contact:  [facility] or [facility]. to become qualified for the adult dental clinic.  Patients with Medicaid: [facility] [phone] [address], [phone] [address], [phone]  If unable to pay, or uninsured, contact [facility] (347)[phone]) or [facility] Department [phone]) [phone] in [place], [phone] in [facility]) to become qualified for the adult dental clinic  Other Low-Cost Community Dental Services: - [facility]- [address] [place], [place], [place], [address], [phone], [address], 2nd and 4th Thursday of the month at 6:30am.  10 clients each day by appointment, can sometimes see walk-in patients if someone does not show for an appointment. [facility]- [address] [place], [place], [address], [phone] - [facility]- [address], [place], [place], [address], [phone] - [facility]- [phone] [facility]- [phone] [facility] Department- [phone]

## 2011-11-17 NOTE — ED Provider Notes (Signed)
History     CSN: [id number]  Arrival date & time 11/17/11  0310   First MD Initiated Contact with Patient 11/17/11 [phone]      Chief Complaint  Patient presents with  . Shortness of Breath  . Cough    (Consider location/radiation/quality/duration/timing/severity/associated sxs/prior treatment) HPI History provided by patient. [place] area has history of asthma.  increased cough and wheezing today. Has ran out of her inhaler and is requesting prescription for the same. No sick contacts at home. Is a smoker. No recent hospital admissions for asthma.  No fevers. Symptoms moderate severity. Sore throat trouble swallowing. Has had some post tussive emesis. No bloody or bilious emesis. Past Medical History  Diagnosis Date  . Asthma     History reviewed. No pertinent past surgical history.  History reviewed. No pertinent family history.  History  Substance Use Topics  . Smoking status: Current Everyday Smoker -- 0.5 packs/day    Types: Cigarettes  . Smokeless tobacco: Not on file  . Alcohol Use: No    OB History    Grav Para Term Preterm Abortions TAB SAB Ect Mult Living                  Review of Systems  Constitutional: Negative for fever and chills.  HENT: Negative for neck pain and neck stiffness.   Eyes: Negative for pain.  Respiratory: Positive for cough and wheezing.   Gastrointestinal: Negative for abdominal pain.  Genitourinary: Negative for dysuria.  Musculoskeletal: Negative for back pain.  Skin: Negative for rash.  Neurological: Negative for headaches.  All other systems reviewed and are negative.    Allergies  Peanut-containing drug products  Home Medications   Current Outpatient Rx  Name Route Sig Dispense Refill  . ALBUTEROL SULFATE HFA 108 (90 BASE) MCG/ACT IN AERS Inhalation Inhale 1-2 puffs into the lungs every 6 (six) hours as needed for wheezing. 1 Inhaler 1    BP 119/98  Pulse 101  Temp 98.1 F (36.7 C) (Oral)  Resp  24  SpO2 97%  LMP 10/23/2011  Physical Exam  Constitutional: She is oriented to person, place, and time. She appears well-developed and well-nourished.  HENT:  Head: Normocephalic and atraumatic.  Eyes: Conjunctivae and EOM are normal. Pupils are equal, round, and reactive to light.  Neck: Trachea normal. Neck supple. No thyromegaly present.  Cardiovascular: Normal rate, regular rhythm, S1 normal, S2 normal and normal pulses.     No systolic murmur is present   No diastolic murmur is present  Pulses:      Radial pulses are 2+ on the right side, and 2+ on the left side.  Pulmonary/Chest: She has no rhonchi. She exhibits no tenderness.       Mild tachypnea with bilateral expiratory wheezes. No respiratory distress  Abdominal: Soft. Normal appearance and bowel sounds are normal. There is no tenderness. There is no CVA tenderness and negative Murphy's sign.  Musculoskeletal:       BLE:s Calves nontender, no cords or erythema, negative Homans sign  Neurological: She is alert and oriented to person, place, and time. She has normal strength. No cranial nerve deficit or sensory deficit. GCS eye subscore is 4. GCS verbal subscore is 5. GCS motor subscore is 6.  Skin: Skin is warm and dry. No rash noted. She is not diaphoretic.  Psychiatric: Her speech is normal.       Cooperative and appropriate    ED Course  Procedures (including critical  care time)   Labs Reviewed  POCT PREGNANCY, URINE   Dg Chest 2 View  11/17/2011  *RADIOLOGY REPORT*  Clinical Data: Cough, shortness of breath, and fever for 1 week.  CHEST - 2 VIEW  Comparison: None.  Findings: Mild hyperinflation with scattered fibrosis suggesting asthma or emphysematous change.  No focal airspace consolidation in the lungs.  No blunting of costophrenic angles.  No pneumothorax. Normal heart size and pulmonary vascularity.  Mild thoracic curvature convex towards the right.  IMPRESSION: Mild hyperinflation may suggest asthma or  emphysema.  No evidence of active pulmonary disease.  Original Report Authenticated By: Marlon Pel, M.D.    Prednisone. Albuterol nebulizer.  On recheck wheezes improved with significant subjective improvement. Patient feeling better and requesting to be discharged home. Chest x-ray obtained and reviewed - no infiltrates or pneumothorax.   Albuterol inhaler provided and stable for discharge home.  MDM   Clinical asthma exacerbation with history of same. Improved with medications as above. Prescription for prednisone and albuterol provided. Primary care referral provided and patient agrees to close followup in the clinic.        Sunnie Nielsen, MD 11/17/11 [phone]

## 2011-11-17 NOTE — ED Notes (Signed)
Patient with asthma and increased shortness of breath.  Patient also states she has been vomiting due to coughing so much.

## 2011-11-17 NOTE — ED Notes (Signed)
UNABLE TO GIVE URINE SPECIMEN AT THIS TIME .  

## 2012-01-11 ENCOUNTER — Encounter (HOSPITAL_COMMUNITY): Payer: Self-pay | Admitting: *Deleted

## 2012-01-11 ENCOUNTER — Emergency Department (HOSPITAL_COMMUNITY)
Admission: EM | Admit: 2012-01-11 | Discharge: 2012-01-11 | Disposition: A | Discharge status: Home / Self Care | Payer: Self-pay | Attending: Emergency Medicine | Admitting: Emergency Medicine

## 2012-01-11 DIAGNOSIS — F17200 Nicotine dependence, unspecified, uncomplicated: Secondary | ICD-10-CM | POA: Insufficient documentation

## 2012-01-11 DIAGNOSIS — R110 Nausea: Secondary | ICD-10-CM | POA: Insufficient documentation

## 2012-01-11 DIAGNOSIS — R109 Unspecified abdominal pain: Secondary | ICD-10-CM

## 2012-01-11 DIAGNOSIS — J45909 Unspecified asthma, uncomplicated: Secondary | ICD-10-CM | POA: Insufficient documentation

## 2012-01-11 DIAGNOSIS — Z91010 Allergy to peanuts: Secondary | ICD-10-CM | POA: Insufficient documentation

## 2012-01-11 LAB — BASIC METABOLIC PANEL
CO2: 27 mEq/L (ref 19–32)
Chloride: 107 mEq/L (ref 96–112)
GFR calc Af Amer: >90 mL/min (ref >90)
Potassium: 3.8 mEq/L (ref 3.5–5.1)
Sodium: 140 mEq/L (ref 135–145)

## 2012-01-11 LAB — URINALYSIS, ROUTINE W REFLEX MICROSCOPIC
Glucose, UA: NEGATIVE mg/dL
Protein, ur: NEGATIVE mg/dL
Specific Gravity, Urine: 1.026 (ref 1.005–1.030)
pH: 6 (ref 5.0–8.0)

## 2012-01-11 LAB — URINE MICROSCOPIC-ADD ON

## 2012-01-11 LAB — PREGNANCY, URINE: Preg Test, Ur: NEGATIVE

## 2012-01-11 LAB — CBC WITH DIFFERENTIAL/PLATELET
Eosinophils Absolute: 0.3 10*3/uL (ref 0.0–0.7)
Hemoglobin: 11.9 g/dL — ABNORMAL LOW (ref 12.0–15.0)
Lymphocytes Relative: 31 % (ref 12–46)
Lymphs Abs: 1.4 10*3/uL (ref 0.7–4.0)
MCH: 32.2 pg (ref 26.0–34.0)
Monocytes Relative: 9 % (ref 3–12)
Neutrophils Relative %: 54 % (ref 43–77)
RBC: 3.7 MIL/uL — ABNORMAL LOW (ref 3.87–5.11)
WBC: 4.6 10*3/uL (ref 4.0–10.5)

## 2012-01-11 NOTE — ED Notes (Signed)
Pt is here with intermittent left lower quad pain for over the last month.  Pt states tender to palpation.  .  NO urinary s/s.  Pt reports white vaginal discharge.  LMP- 7/27

## 2012-01-11 NOTE — ED Provider Notes (Signed)
History     CSN: [id number]  Arrival date & time 01/11/12  1121   First MD Initiated Contact with Patient 01/11/12 1907      Chief Complaint  Patient presents with  . Abdominal Pain    (Consider location/radiation/quality/duration/timing/severity/associated sxs/prior treatment) Patient is a 26 y.o. female presenting with abdominal pain. The history is provided by the patient and medical records.  Abdominal Pain The primary symptoms of the illness include abdominal pain and nausea. The primary symptoms of the illness do not include fever, shortness of breath, vomiting, diarrhea, dysuria or vaginal bleeding.  Symptoms associated with the illness do not include chills, diaphoresis or back pain.   26 year old, female, with history of ovarian cyst, presents to emergency department complaining of abdominal pain.  For greater than a month.  The pain is intermittent, and only lasts a few minutes at a time.  She also has nausea when the pain is present.  She has not had diarrhea, or vomiting.  She denies any urinary tract symptoms.  Her menstrual cycles have been irregular.  She has not had a history of fibroids in the past.  However, she does have a family history of fibroids.  Past Medical History  Diagnosis Date  . Asthma   . Ovarian cyst     Past Surgical History  Procedure Date  . Ovarian cyst surgery     No family history on file.  History  Substance Use Topics  . Smoking status: Current Everyday Smoker -- 0.5 packs/day    Types: Cigarettes  . Smokeless tobacco: Not on file  . Alcohol Use: No    OB History    Grav Para Term Preterm Abortions TAB SAB Ect Mult Living                  Review of Systems  Constitutional: Negative for fever, chills and diaphoresis.  Respiratory: Negative for cough and shortness of breath.   Cardiovascular: Negative for chest pain.  Gastrointestinal: Positive for nausea and abdominal pain. Negative for vomiting and diarrhea.  Genitourinary:  Negative for dysuria, vaginal bleeding and vaginal pain.  Musculoskeletal: Negative for back pain.  Skin: Negative for rash.  Neurological: Negative for headaches.  Psychiatric/Behavioral: Negative for confusion.  All other systems reviewed and are negative.    Allergies  Peanut-containing drug products  Home Medications   Current Outpatient Rx  Name Route Sig Dispense Refill  . ALBUTEROL SULFATE HFA 108 (90 BASE) MCG/ACT IN AERS Inhalation Inhale 1-2 puffs into the lungs every 6 (six) hours as needed for wheezing. 1 Inhaler 0    BP 104/78  Pulse 73  Temp 98.5 F (36.9 C) (Oral)  Resp 18  SpO2 95%  LMP 12/17/2011  Physical Exam  Nursing note and vitals reviewed. Constitutional: She is oriented to person, place, and time. She appears well-developed and well-nourished. No distress.  HENT:  Head: Normocephalic and atraumatic.  Eyes: Conjunctivae are normal.  Neck: Normal range of motion. Neck supple.  Cardiovascular: Normal rate, regular rhythm and intact distal pulses.   No murmur heard. Pulmonary/Chest: Effort normal and breath sounds normal.  Abdominal: Soft. Bowel sounds are normal. She exhibits no distension and no mass. There is no tenderness. There is no rebound and no guarding.  Musculoskeletal: Normal range of motion. She exhibits no edema.  Neurological: She is alert and oriented to person, place, and time.  Skin: Skin is warm and dry.  Psychiatric: She has a normal mood and affect. Thought content normal.  ED Course  Procedures (including critical care time) 26 year old, female, with no significant past medical history other than ovarian cyst, presents emergency department with intermittent abdominal pain.  For greater than a month.  The pain only lasts a few minutes at a time.  It is not associated with urinary tract symptoms, vomiting, or diarrhea.  She has had irregular menses, as well.  I suspect, that she has fibroids.  We'll refer her to gynecology for  evaluation.  Labs Reviewed  CBC WITH DIFFERENTIAL - Abnormal; Notable for the following:    RBC 3.70 (*)     Hemoglobin 11.9 (*)     HCT 33.8 (*)     Eosinophils Relative 6 (*)     All other components within normal limits  URINALYSIS, ROUTINE W REFLEX MICROSCOPIC - Abnormal; Notable for the following:    APPearance HAZY (*)     Hgb urine dipstick LARGE (*)     Leukocytes, UA MODERATE (*)     All other components within normal limits  BASIC METABOLIC PANEL - Abnormal; Notable for the following:    GFR calc non Af Amer 88 (*)     All other components within normal limits  URINE MICROSCOPIC-ADD ON - Abnormal; Notable for the following:    Squamous Epithelial / LPF FEW (*)     Bacteria, UA FEW (*)     All other components within normal limits  PREGNANCY, URINE   No results found.   No diagnosis found.    MDM  Abdominal pain Suspect fibroids. No acute abdomen. No uti. Not pregnant.   No acute illness.        Cheri Guppy, MD 01/11/12 [phone]

## 2012-05-23 ENCOUNTER — Emergency Department: Payer: Self-pay | Admitting: Emergency Medicine

## 2012-06-17 ENCOUNTER — Encounter (HOSPITAL_COMMUNITY): Payer: Self-pay | Admitting: Emergency Medicine

## 2012-06-17 ENCOUNTER — Emergency Department (HOSPITAL_COMMUNITY): Payer: Self-pay

## 2012-06-17 ENCOUNTER — Emergency Department (HOSPITAL_COMMUNITY)
Admission: EM | Admit: 2012-06-17 | Discharge: 2012-06-17 | Disposition: A | Discharge status: Home / Self Care | Payer: Self-pay | Attending: Emergency Medicine | Admitting: Emergency Medicine

## 2012-06-17 DIAGNOSIS — R059 Cough, unspecified: Secondary | ICD-10-CM | POA: Insufficient documentation

## 2012-06-17 DIAGNOSIS — R05 Cough: Secondary | ICD-10-CM | POA: Insufficient documentation

## 2012-06-17 DIAGNOSIS — F17200 Nicotine dependence, unspecified, uncomplicated: Secondary | ICD-10-CM | POA: Insufficient documentation

## 2012-06-17 DIAGNOSIS — J3489 Other specified disorders of nose and nasal sinuses: Secondary | ICD-10-CM | POA: Insufficient documentation

## 2012-06-17 DIAGNOSIS — J159 Unspecified bacterial pneumonia: Secondary | ICD-10-CM | POA: Insufficient documentation

## 2012-06-17 DIAGNOSIS — R110 Nausea: Secondary | ICD-10-CM | POA: Insufficient documentation

## 2012-06-17 DIAGNOSIS — J069 Acute upper respiratory infection, unspecified: Secondary | ICD-10-CM | POA: Insufficient documentation

## 2012-06-17 DIAGNOSIS — R5381 Other malaise: Secondary | ICD-10-CM | POA: Insufficient documentation

## 2012-06-17 DIAGNOSIS — Z79899 Other long term (current) drug therapy: Secondary | ICD-10-CM | POA: Insufficient documentation

## 2012-06-17 DIAGNOSIS — Z8742 Personal history of other diseases of the female genital tract: Secondary | ICD-10-CM | POA: Insufficient documentation

## 2012-06-17 DIAGNOSIS — M129 Arthropathy, unspecified: Secondary | ICD-10-CM | POA: Insufficient documentation

## 2012-06-17 DIAGNOSIS — R51 Headache: Secondary | ICD-10-CM | POA: Insufficient documentation

## 2012-06-17 DIAGNOSIS — J189 Pneumonia, unspecified organism: Secondary | ICD-10-CM

## 2012-06-17 DIAGNOSIS — J45909 Unspecified asthma, uncomplicated: Secondary | ICD-10-CM | POA: Insufficient documentation

## 2012-06-17 MED ORDER — ALBUTEROL SULFATE HFA 108 (90 BASE) MCG/ACT IN AERS
2.0000 | INHALATION_SPRAY | RESPIRATORY_TRACT | Status: DC | PRN
  Administered 2012-06-17: 2 via RESPIRATORY_TRACT
  Filled 2012-06-17: qty 6.7

## 2012-06-17 MED ORDER — ONDANSETRON 4 MG PO TBDP
8.0000 mg | ORAL_TABLET | Freq: Once | ORAL | Status: AC
  Administered 2012-06-17: 8 mg via ORAL
  Filled 2012-06-17: qty 2

## 2012-06-17 MED ORDER — AMOXICILLIN 500 MG PO CAPS: 1000.0000 mg | ORAL_CAPSULE | Freq: Two times a day (BID) | ORAL | Status: DC

## 2012-06-17 MED ORDER — AZITHROMYCIN 250 MG PO TABS: ORAL_TABLET | ORAL | Status: DC

## 2012-06-17 MED ORDER — IBUPROFEN 800 MG PO TABS
800.0000 mg | ORAL_TABLET | Freq: Once | ORAL | Status: AC
  Administered 2012-06-17: 800 mg via ORAL
  Filled 2012-06-17: qty 1

## 2012-06-17 MED ORDER — ONDANSETRON HCL 4 MG PO TABS: 4.0000 mg | ORAL_TABLET | Freq: Four times a day (QID) | ORAL | Status: DC

## 2012-06-17 NOTE — ED Provider Notes (Signed)
History     CSN: [id number]  Arrival date & time 06/17/12  7829   First MD Initiated Contact with Patient 06/17/12 [phone]      Chief Complaint  Patient presents with  . URI    (Consider location/radiation/quality/duration/timing/severity/associated sxs/prior treatment) Patient is a 27 y.o. female presenting with URI. The history is provided by the patient and a relative. No language interpreter was used.  URI The primary symptoms include fever, fatigue, headaches, cough, nausea and arthralgias. Primary symptoms do not include ear pain, sore throat, swollen glands, wheezing, abdominal pain or vomiting. This is a new problem. The problem has been gradually worsening.  Symptoms associated with the illness include chills, congestion and rhinorrhea. The illness is not associated with sinus pressure.   27 year old female otherwise healthy coming in with upper respiratory symptoms. States that she coughs that started 2 days ago with fever nausea but no vomiting. She's also having rhinorrhea and headaches body aches and pains. She denies shortness of breath or chest pain. Patient has a past medical history of asthma and she is a smoker.   Past Medical History  Diagnosis Date  . Asthma   . Ovarian cyst     Past Surgical History  Procedure Date  . Ovarian cyst surgery     No family history on file.  History  Substance Use Topics  . Smoking status: Current Every Day Smoker -- 0.5 packs/day    Types: Cigarettes  . Smokeless tobacco: Not on file  . Alcohol Use: No    OB History    Grav Para Term Preterm Abortions TAB SAB Ect Mult Living                  Review of Systems  Constitutional: Positive for fever, chills and fatigue.  HENT: Positive for congestion and rhinorrhea. Negative for ear pain, sore throat and sinus pressure.   Eyes: Negative.   Respiratory: Positive for cough. Negative for shortness of breath and wheezing.   Cardiovascular: Negative.  Negative for chest  pain.  Gastrointestinal: Positive for nausea. Negative for vomiting and abdominal pain.  Musculoskeletal: Positive for arthralgias. Negative for back pain and gait problem.  Neurological: Positive for headaches.  Psychiatric/Behavioral: Negative.   All other systems reviewed and are negative.    Allergies  Peanut-containing drug products  Home Medications   Current Outpatient Rx  Name  Route  Sig  Dispense  Refill  . ALBUTEROL SULFATE HFA 108 (90 BASE) MCG/ACT IN AERS   Inhalation   Inhale 2 puffs into the lungs as needed. For shortness of breath/wheezing         . NYQUIL PO   Oral   Take by mouth at bedtime as needed. For cough/congestion         . ALEVE COLD & SINUS PO   Oral   Take 2 tablets by mouth every 4 (four) hours as needed. For congestion/headache         . AMOXICILLIN 500 MG PO CAPS   Oral   Take 2 capsules (1,000 mg total) by mouth 2 (two) times daily.   40 capsule   0   . AZITHROMYCIN 250 MG PO TABS      Take 2 pills today then one pill (starting tomorrow) every day until gone   6 tablet   0   . ONDANSETRON HCL 4 MG PO TABS   Oral   Take 1 tablet (4 mg total) by mouth every 6 (six) hours.  12 tablet   0     BP 120/78  Pulse 99  Temp 102.7 F (39.3 C) (Oral)  Resp 16  SpO2 96%  LMP 05/30/2012  Physical Exam  Nursing note and vitals reviewed. Constitutional: She is oriented to person, place, and time. She appears well-developed and well-nourished.  HENT:  Head: Normocephalic and atraumatic.  Eyes: Conjunctivae normal and EOM are normal. Pupils are equal, round, and reactive to light.  Neck: Normal range of motion. Neck supple.  Cardiovascular: Normal rate.   Pulmonary/Chest: Effort normal and breath sounds normal. No respiratory distress. She has no wheezes. She has no rales.       Coughing  Abdominal: Soft. Bowel sounds are normal. She exhibits no distension. There is no tenderness.  Musculoskeletal: Normal range of motion. She  exhibits no edema and no tenderness.  Lymphadenopathy:    She has no cervical adenopathy.  Neurological: She is alert and oriented to person, place, and time. She has normal reflexes.  Skin: Skin is warm and dry.  Psychiatric: She has a normal mood and affect.    ED Course  Procedures (including critical care time)  Labs Reviewed - No data to display Dg Chest 2 View  06/17/2012  *RADIOLOGY REPORT*  Clinical Data: Headache, cough  CHEST - 2 VIEW  Comparison: 11/17/2011  Findings: Cardiomediastinal silhouette is stable.  Mild dextroscoliosis again noted.  There is patchy infiltrate/pneumonia in the left base.  Follow-up to resolution after appropriate treatment is recommended.  No pulmonary edema.  IMPRESSION:  Patchy infiltrate/pneumonia left base.  Follow-up to resolution after appropriate treatment is recommended.   Original Report Authenticated By: Natasha Mead, M.D.      1. Community acquired pneumonia       MDM   Left lower lobe pneumonia. Patient was put on amoxicillin and azithromycin. She will followup with the PCP of her choice from the list provided. She understands to return to the ER for worsening symptoms. Recommend she quit smoking. She was given an inhaler as well. Nontoxic appearance      Remi Haggard, NP 06/17/12 1137

## 2012-06-17 NOTE — ED Notes (Signed)
Patient transported to X-ray 

## 2012-06-17 NOTE — ED Notes (Signed)
Pt. Stated, i started having a bad headache and a fever 2 days ago and I think I have a sinus infection, cause my face feels really sore.

## 2012-06-17 NOTE — ED Provider Notes (Signed)
Medical screening examination/treatment/procedure(s) were performed by non-physician practitioner and as supervising physician I was immediately available for consultation/collaboration.  Jullian Clayson T Ramesses Crampton, MD 06/17/12 1601 

## 2012-11-08 ENCOUNTER — Emergency Department (HOSPITAL_COMMUNITY)
Admission: EM | Admit: 2012-11-08 | Discharge: 2012-11-08 | Disposition: A | Discharge status: Home / Self Care | Payer: Self-pay | Attending: Emergency Medicine | Admitting: Emergency Medicine

## 2012-11-08 ENCOUNTER — Encounter (HOSPITAL_COMMUNITY): Payer: Self-pay | Admitting: Emergency Medicine

## 2012-11-08 DIAGNOSIS — K611 Rectal abscess: Secondary | ICD-10-CM

## 2012-11-08 DIAGNOSIS — K612 Anorectal abscess: Secondary | ICD-10-CM | POA: Insufficient documentation

## 2012-11-08 DIAGNOSIS — F17200 Nicotine dependence, unspecified, uncomplicated: Secondary | ICD-10-CM | POA: Insufficient documentation

## 2012-11-08 DIAGNOSIS — Z79899 Other long term (current) drug therapy: Secondary | ICD-10-CM | POA: Insufficient documentation

## 2012-11-08 DIAGNOSIS — J45909 Unspecified asthma, uncomplicated: Secondary | ICD-10-CM | POA: Insufficient documentation

## 2012-11-08 DIAGNOSIS — Z8742 Personal history of other diseases of the female genital tract: Secondary | ICD-10-CM | POA: Insufficient documentation

## 2012-11-08 MED ORDER — TRAMADOL HCL 50 MG PO TABS: 50.0000 mg | ORAL_TABLET | Freq: Four times a day (QID) | ORAL | Status: DC | PRN

## 2012-11-08 NOTE — ED Provider Notes (Signed)
History     CSN: [id number]  Arrival date & time 11/08/12  1100   First MD Initiated Contact with Patient 11/08/12 1134      Chief Complaint  Patient presents with  . Abscess    (Consider location/radiation/quality/duration/timing/severity/associated sxs/prior treatment) HPI Comments: Patient with history of one previous boil presents with complaint of a bump on her buttocks that she first noticed yesterday. This area has enlarged. She denies fever, nausea, vomiting. She denies drainage. No treatments prior to arrival other than Tylenol for pain. Onset of symptoms gradual. Course is gradually worsening. Nothing makes symptoms better. Palpation makes the pain worse.  Patient is a 27 y.o. female presenting with abscess. The history is provided by the patient.  Abscess Associated symptoms: no fever, no nausea and no vomiting     Past Medical History  Diagnosis Date  . Asthma   . Ovarian cyst     Past Surgical History  Procedure Laterality Date  . Ovarian cyst surgery      History reviewed. No pertinent family history.  History  Substance Use Topics  . Smoking status: Current Every Day Smoker -- 0.50 packs/day    Types: Cigarettes  . Smokeless tobacco: Not on file  . Alcohol Use: No    OB History   Grav Para Term Preterm Abortions TAB SAB Ect Mult Living                  Review of Systems  Constitutional: Negative for fever.  Gastrointestinal: Negative for nausea and vomiting.  Skin: Negative for color change.       Positive for abscess.  Hematological: Negative for adenopathy.    Allergies  Peanut-containing drug products  Home Medications   Current Outpatient Rx  Name  Route  Sig  Dispense  Refill  . acetaminophen (TYLENOL) 500 MG tablet   Oral   Take 500 mg by mouth every 4 (four) hours. Menstrual pain         . albuterol (PROVENTIL HFA;VENTOLIN HFA) 108 (90 BASE) MCG/ACT inhaler   Inhalation   Inhale 2 puffs into the lungs as needed. For  shortness of breath/wheezing         . EPINEPHrine (EPIPEN) 0.3 mg/0.3 mL DEVI   Intramuscular   Inject 0.3 mg into the muscle once. Used for Peanut allergy         . guaiFENesin (ROBITUSSIN) 100 MG/5ML liquid   Oral   Take by mouth 2 (two) times daily as needed for cough. Patient took 1/2 of the cupful that comes with the medication package (Mucinex Liquid)           BP 109/79  Pulse 82  Temp(Src) 98.5 F (36.9 C) (Oral)  Resp 16  SpO2 99%  LMP 10/14/2012  Physical Exam  Nursing note and vitals reviewed. Constitutional: She appears well-developed and well-nourished.  HENT:  Head: Normocephalic and atraumatic.  Eyes: Conjunctivae are normal. Right eye exhibits no discharge. Left eye exhibits no discharge.  Neck: Normal range of motion. Neck supple.  Cardiovascular: Normal rate, regular rhythm and normal heart sounds.   Pulmonary/Chest: Effort normal and breath sounds normal.  Abdominal: Soft. There is no tenderness.  Genitourinary:     Neurological: She is alert.  Skin: Skin is warm and dry.  Psychiatric: She has a normal mood and affect.    ED Course  Procedures (including critical care time)  Labs Reviewed - No data to display No results found.   1. Perirectal abscess  12:11 PM Patient seen and examined. Patient agrees to allow I&D.  Vital signs reviewed and are as follows: Filed Vitals:   11/08/12 1125  BP: 109/79  Pulse: 82  Temp: 98.5 F (36.9 C)  Resp: 16   INCISION AND DRAINAGE Performed by: Carolee Rota Consent: Verbal consent obtained. Risks and benefits: risks, benefits and alternatives were discussed Type: abscess  Body area: L buttocks  Anesthesia: local infiltration  Incision was made with a scalpel.  Local anesthetic: lidocaine 2% with epinephrine  Anesthetic total: 2 ml  Complexity: complex Blunt dissection to break up loculations  Drainage: purulent  Drainage amount: moderate  Packing material:  none  Patient tolerance: Patient tolerated the procedure well with no immediate complications.   12:23 PM The patient was urged to return to the Emergency Department urgently with worsening pain, swelling, expanding erythema especially if it streaks away from the affected area, fever, or if they have any other concerns.   The patient was urged to return to the Emergency Department or go to their PCP in 48 hours for wound recheck if the area is not significantly improved.  The patient verbalized understanding and stated agreement with this plan.   Encouraged patient to followup with surgeon if not improving over the next week or she has difficulty with defecation. Patient verbalizes understanding and agrees with plan.  Patient counseled on use of narcotic pain medications. Counseled not to combine these medications with others containing tylenol. Urged not to drink alcohol, drive, or perform any other activities that requires focus while taking these medications. The patient verbalizes understanding and agrees with the plan.  Patient reports improvement in pain prior to discharge.    MDM  Perirectal abscess, drained without difficulty. I do not feel that there is a fistula to the GI tract at this time. Patient does not have pain with defecation. However given its proximity to the anus, referral given for patient to followup with surgery if not improving or worsening.         Renne Crigler, PA-C 11/08/12 1225

## 2012-11-08 NOTE — ED Notes (Signed)
I and D performed by PA.

## 2012-11-08 NOTE — ED Provider Notes (Signed)
Medical screening examination/treatment/procedure(s) were performed by non-physician practitioner and as supervising physician I was immediately available for consultation/collaboration.  Eduardo Honor, MD 11/08/12 1541 

## 2012-11-08 NOTE — ED Notes (Signed)
Grandmother [phone] call for ride

## 2012-11-08 NOTE — ED Notes (Signed)
Pt reports noticing a bump to buttocks yesterday. Pt reports today area is very hot, swollen, painful.

## 2013-09-24 ENCOUNTER — Emergency Department: Payer: Self-pay

## 2013-09-24 ENCOUNTER — Emergency Department
Admission: EM | Admit: 2013-09-24 | Discharge: 2013-09-24 | Disposition: A | Discharge status: Home / Self Care | Payer: Medicaid Other | Attending: Emergency Medicine | Admitting: Emergency Medicine

## 2013-09-24 DIAGNOSIS — R300 Dysuria: Secondary | ICD-10-CM | POA: Insufficient documentation

## 2013-09-24 DIAGNOSIS — J4531 Mild persistent asthma with (acute) exacerbation: Secondary | ICD-10-CM

## 2013-09-24 DIAGNOSIS — Z760 Encounter for issue of repeat prescription: Secondary | ICD-10-CM

## 2013-09-24 DIAGNOSIS — F17200 Nicotine dependence, unspecified, uncomplicated: Secondary | ICD-10-CM | POA: Insufficient documentation

## 2013-09-24 DIAGNOSIS — R079 Chest pain, unspecified: Secondary | ICD-10-CM

## 2013-09-24 DIAGNOSIS — J45901 Unspecified asthma with (acute) exacerbation: Secondary | ICD-10-CM | POA: Insufficient documentation

## 2013-09-24 MED ORDER — PREDNISONE 20 MG PO TABS
40.0000 mg | ORAL_TABLET | Freq: Every day | ORAL | Status: AC
Start: 2013-09-24 — End: 2013-09-29

## 2013-09-24 MED ORDER — IPRATROPIUM BROMIDE 0.02 % IN SOLN
0.5000 mg | Freq: Once | RESPIRATORY_TRACT | Status: AC
Start: 2013-09-24 — End: 2013-09-24
  Administered 2013-09-24: 0.5 mg via RESPIRATORY_TRACT
  Filled 2013-09-24: qty 2.5

## 2013-09-24 MED ORDER — ALBUTEROL SULFATE (2.5 MG/3ML) 0.083% IN NEBU
2.5000 mg | INHALATION_SOLUTION | RESPIRATORY_TRACT | Status: AC | PRN
Start: 2013-09-24 — End: 2013-10-24

## 2013-09-24 MED ORDER — ALBUTEROL SULFATE HFA 108 (90 BASE) MCG/ACT IN AERS
2.0000 | INHALATION_SPRAY | RESPIRATORY_TRACT | Status: AC | PRN
Start: 2013-09-24 — End: 2013-10-24

## 2013-09-24 MED ORDER — ALBUTEROL SULFATE (2.5 MG/3ML) 0.083% IN NEBU
2.5000 mg | INHALATION_SOLUTION | Freq: Once | RESPIRATORY_TRACT | Status: AC
Start: 2013-09-24 — End: 2013-09-24
  Administered 2013-09-24: 2.5 mg via RESPIRATORY_TRACT
  Filled 2013-09-24: qty 3

## 2013-09-24 MED ORDER — PREDNISONE 20 MG PO TABS
60.0000 mg | ORAL_TABLET | Freq: Once | ORAL | Status: AC
Start: 2013-09-24 — End: 2013-09-24
  Administered 2013-09-24: 60 mg via ORAL
  Filled 2013-09-24: qty 3

## 2013-09-24 MED ORDER — NITROFURANTOIN MONOHYD MACRO 100 MG PO CAPS
100.0000 mg | ORAL_CAPSULE | Freq: Two times a day (BID) | ORAL | Status: AC
Start: 2013-09-24 — End: 2013-10-01

## 2013-09-24 NOTE — ED Notes (Signed)
Pt reports asthma symptoms and ran out of albuterol. Pt states she got up to go to work and had chest pain and tightness. Pt is alert and orinted x3.

## 2013-09-24 NOTE — Discharge Instructions (Signed)
Asthma    You have been seen for an asthma attack.    Asthma is also called reactive airway disease.    This disease occurs when the breathing tubes are irritated and begin to spasm. The irritation causes the tubes to swell and make extra mucus. The spasms make it difficult to breathe, especially when exhaling (breathing out). Some medications treat the spasm. An example is bronchodilators, like albuterol (Ventolin/Proventil). Other medications treat the inflammation. An example is steroids, like prednisone (Prelone/Orapred).     Symptoms may include wheezing, dry coughing, difficulty breathing, and chest pain and tightness. Coughing until you vomit may also be a sign of asthma.    Asthma attacks can be caused by medications (like aspirin), smoke, air pollution, exercise, dust mites or pet dander.    Asthma attacks are serious. They can be life-threatening!    Do not smoke. Research has proven that smoking causes heart disease, cancer, and birth defects. Avoiding smoking will help your asthma. If you smoke, ask your doctor for ideas about how to stop.   If you do not smoke, avoid others who do. Smoke will irritate your lungs and make your asthma worse.    Avoid things that irritate your lungs. These might include smoke, pollen, dust, mold, mildew, pets and perfumes. If you are allergic to pollen, then stay indoors when pollen counts are high.    Work closely with your doctor to control your asthma.    Use your albuterol (Ventolin/Proventil) inhaler every 4 hours as needed for wheezing, coughing or shortness of breath. Using this medication as directed will help symptoms. It is best to use a spacer to help the medicine reach your lungs. Your doctor can prescribe a spacer.    Discuss with your doctor how to measure your breathing with a handheld peak flow meter. This will help your doctor to adjust your treatment.    Follow these instructions for using your metered dose inhaler:   Shake the  inhaler.   Remove the caps on the inhaler and the spacer.   Place the inhaler in the spacer.   Breathe out.   Place the spacer's mouthpiece in your mouth.   Press down on the inhaler to move a puff into the spacer.   Slowly and deeply inhale (breathe in) through your mouth.   Hold your breath for 5 seconds before exhaling (breathing out).   Wait 1 minute, then repeat steps above for each puff.   Replace the caps on your inhaler and spacer.   Clean the spacer using the instructions that came with it.   If you are using a steroid inhaler, rinse your mouth with water after use.    Do not wait until your inhaler is empty before getting a refill. To check how full your inhaler is, remove the mouthpiece and place the inhaler in a bowl of water. If it floats near the surface of the water with the nipple end pointing down, it is half full. If it floats flat on the surface, it is empty. Call your doctor immediately for a refill.    IT IS VERY IMPORTANT to use the inhaler as instructed. Using it too much can cause serious problems.    YOU SHOULD SEEK MEDICAL ATTENTION IMMEDIATELY, EITHER HERE OR AT THE NEAREST EMERGENCY DEPARTMENT, IF ANY OF THE FOLLOWING OCCURS:   You do not improve in 48 to 72 hours or your symptoms get worse.   You vomit, you cannot keep medication down, or   you feel dizzy, weak, or confused.   Your shortness of breath gets worse.   Your inhaler does not help your breathing.   You feel chest pain.   You cough up green or yellow material.   You have a fever (temperature higher than 100.4F / 38C).   You wheeze or have difficulty breathing.

## 2013-09-24 NOTE — ED Provider Notes (Signed)
EMERGENCY DEPARTMENT HISTORY AND PHYSICAL EXAM     Physician/Midlevel provider first contact with patient: 09/24/13 0901         Date: 09/24/2013  Patient Name: Beth Hays    History of Presenting Illness     Chief Complaint   Patient presents with   . Asthma       History Provided By: Patient    Chief Complaint: Asthma exacerbation  Onset: 2:45 AM   Timing: Intermittent   Location: Upper respiratory  Quality: "tightness"  Severity: Moderate  Modifying Factors: None reported  Associated Symptoms: chest pain (tightness), congestion, yellow sputum (spitting)    Additional History: Beth Hays is a 28 y.o. female with history of Asthma presenting to the ED due to asthma exacerbation, waking patient up 2:45 AM. Over the past few days pt has also been noting a cough, congestion, chest tightness (secondary to cough) and along with spitting out yellow mucus. Pt ran out of her albuterol nebulizer. She had a severe asthma exacerbation several years ago and needed to be intubated at that time.  Pt has been admitted for a day before for her worsening Asthma ~ 6 years ago. Pt is an everyday smoker but she is trying to quit. No illicit drugs. No family history of early heart problems or sudden death in young people. No history of DVT, PE, or immobilization. NKDA. Pt has seasonal allergies.      PCP: Pcp, Noneorunknown, MD      Current Facility-Administered Medications   Medication Dose Route Frequency Provider Last Rate Last Dose   . [COMPLETED] albuterol (PROVENTIL) nebulizer solution 2.5 mg  2.5 mg Nebulization Once Maryella Shivers, MD   2.5 mg at 09/24/13 0909   . [COMPLETED] ipratropium (ATROVENT) 0.02 % nebulizer solution 0.5 mg  0.5 mg Nebulization Once Maryella Shivers, MD   0.5 mg at 09/24/13 0909   . [COMPLETED] predniSONE (DELTASONE) tablet 60 mg  60 mg Oral Once Maryella Shivers, MD   60 mg at 09/24/13 0909     Current Outpatient Prescriptions   Medication Sig Dispense Refill   . Fexofenadine HCl (ALLEGRA PO) Take by  mouth.       Marland Kitchen albuterol (PROVENTIL HFA;VENTOLIN HFA) 108 (90 BASE) MCG/ACT inhaler Inhale 2 puffs into the lungs every 4 (four) hours as needed for Wheezing or Shortness of Breath (coughing). Dispense with spacer  1 Inhaler  0   . albuterol (PROVENTIL) (2.5 MG/3ML) 0.083% nebulizer solution Take 3 mLs (2.5 mg total) by nebulization every 4 (four) hours as needed for Wheezing or Shortness of Breath (coughing).  25 each  0   . nitrofurantoin, macrocrystal-monohydrate, (MACROBID) 100 MG capsule Take 1 capsule (100 mg total) by mouth 2 (two) times daily.  14 capsule  0   . predniSONE (DELTASONE) 20 MG tablet Take 2 tablets (40 mg total) by mouth daily.  10 tablet  0       Past History     Past Medical History:  Past Medical History   Diagnosis Date   . Asthma without status asthmaticus        Past Surgical History:  Past Surgical History   Procedure Date   . Ovarian cyst removal        Family History:  No family history on file.    Social History:  History   Substance Use Topics   . Smoking status: Not on file   . Smokeless tobacco: Not on file   . Alcohol Use:  Allergies:  No Known Allergies    Review of Systems     Review of Systems   Constitutional: Positive for malaise/fatigue. Negative for fever.   HENT: Positive for congestion.    Eyes: Negative for discharge and redness.   Respiratory: Positive for cough, sputum production (yellow) and shortness of breath.         + chest tightness secondary to coughing.    Cardiovascular: Negative for leg swelling.   Gastrointestinal: Negative for nausea, vomiting, abdominal pain and diarrhea.   Genitourinary: Negative for dysuria and frequency.   Musculoskeletal: Negative for falls.   Skin: Negative for rash.   Neurological: Negative for loss of consciousness and headaches.   Endo/Heme/Allergies: Positive for environmental allergies.   Psychiatric/Behavioral: Negative for suicidal ideas.         Physical Exam   BP 140/96  Pulse 70  Temp 98.5 F (36.9 C)  Resp 18  Ht  1.676 m  Wt 64.864 kg  BMI 23.09 kg/m2  SpO2 100%  LMP 09/24/2013    Constitutional: Vital signs reviewed. Well appearing. No distress.  Head: Normocephalic, atraumatic  Eyes: Conjunctiva and sclera are normal.  No injection or discharge.  Ears, Nose, Throat:  Normal external examination of the nose and ears.  Mucous membranes moist.  Neck: Normal range of motion. Supple, no meningeal signs. Trachea midline.  Respiratory/Chest: Diffuse expiratory wheeze with good air movement. No respiratory distress.   Cardiovascular: Regular rate and rhythm. No murmurs.  Abdomen:  Bowel sounds intact. No rebound or guarding. Soft.  Non-tender.  Back: No cva tenderness to percussion.  Upper Extremity:  No edema. No cyanosis. Bilateral radial pulses intact and equal.   Lower Extremity:  No edema. No cyanosis. Bilateral calves symmetrical and non-tender.   Skin: Warm and dry. No rash.  Neuro: A&Ox3. Moves all extremities spontaneously. Normal gait.   Psychiatric: Normal affect.  Normal insight.      Diagnostic Study Results     Labs -     Results     ** No Results found for the last 24 hours. **          Radiologic Studies -   Radiology Results (24 Hour)     Procedure Component Value Units Date/Time    Chest AP Portable [[id number]] Collected:09/24/13 0931    Order Status:Completed  Updated:09/24/13 4098    Narrative:    CLINICAL INDICATION: asthma, chest discomfort     COMPARISON: None available    INTERPRETATION:   A single frontal view of the chest was obtained. .     The cardiomediastinal contour  is within normal limits for age. The  lungs are clear and the sulci are sharp.         Impression:      No acute cardiopulmonary process.    Wyatt Portela, MD   09/24/2013 9:31 AM        .      Medical Decision Making   I am the first provider for this patient.    I reviewed the vital signs, available nursing notes, past medical history, past surgical history, family history and social history.    Vital Signs-Reviewed the patient's  vital signs.     Patient Vitals for the past 12 hrs:   BP Temp Pulse Resp   09/24/13 0853 140/96 mmHg 98.5 F (36.9 C) 70  18        Pulse Oximetry Analysis - Normal 100% on RA.  EKG:  Interpreted by the EP.   Time Interpreted: 8:55   Rate: 78   Rhythm: Sinus Rhythm.    Interpretation:No ST elevation   Comparison: No prior study is available for comparison.    Old Medical Records: Old medical records.  Nursing notes.     ED Course:     9:39 AM - Patient feeling better, no wheezing on repeat exam. Pt also informing us that she was diagnosed with a UTI in another ED, but lost her antibiotic prescription.     9:44 AM - Discussed x-ray results with pt and counseled on diagnosis, f/u plans, and signs and symptoms when to return to ED.        Provider Notes: Pt presenting to ED with asthma exacerbation. Out of home albuterol. Non-toxic appearing with no SIRS criteria or hypoxia. Felt much better after nebs. Wheezing resolved ater nebs. Provided refills of albuterol. Given dose of prednisone and rx for prednisone. Reviewed importance of having albuterol on her person at all times, especially in the setting of needing intubation once. Doubt other causes of chest pain such as PE, ACS, etc given overall clinical context. Wells score zero, PERC score zero. Referrals to primary care provided. Reviewed red flags for return to ED and pt voiced understanding.          Diagnosis     Clinical Impression:   1. Asthma exacerbation, mild persistent    2. Medication refill    3. Dysuria        _______________________________    Attestations:  This note is prepared by Nicholes Rough acting as Scribe for Lynnea Ferrier, MD    Lynnea Ferrier, MD - The scribe's documentation has been prepared under my direction and personally reviewed by me in its entirety.  I confirm that the note above accurately reflects all work, treatment, procedures, and medical decision making performed by me.    _______________________________          Maryella Shivers, MD  09/24/13 [phone]

## 2013-09-29 LAB — ECG 12-LEAD
Atrial Rate: 80 {beats}/min
Q-T Interval: 368 ms
QRS Duration: 68 ms
QTC Calculation (Bezet): 419 ms
R Axis: 65 degrees
T Axis: 7 degrees
Ventricular Rate: 78 {beats}/min

## 2014-02-05 ENCOUNTER — Emergency Department
Admission: EM | Admit: 2014-02-05 | Discharge: 2014-02-05 | Disposition: A | Discharge status: Home / Self Care | Payer: Medicaid Other | Attending: Emergency Medicine | Admitting: Emergency Medicine

## 2014-02-05 ENCOUNTER — Emergency Department: Payer: Self-pay

## 2014-02-05 ENCOUNTER — Emergency Department: Payer: Medicaid Other

## 2014-02-05 DIAGNOSIS — R102 Pelvic and perineal pain: Secondary | ICD-10-CM

## 2014-02-05 DIAGNOSIS — J45909 Unspecified asthma, uncomplicated: Secondary | ICD-10-CM | POA: Insufficient documentation

## 2014-02-05 DIAGNOSIS — D259 Leiomyoma of uterus, unspecified: Secondary | ICD-10-CM | POA: Insufficient documentation

## 2014-02-05 DIAGNOSIS — N949 Unspecified condition associated with female genital organs and menstrual cycle: Secondary | ICD-10-CM | POA: Insufficient documentation

## 2014-02-05 LAB — URINALYSIS, REFLEX TO MICROSCOPIC EXAM IF INDICATED
Bilirubin, UA: NEGATIVE
Blood, UA: NEGATIVE
Glucose, UA: NEGATIVE
Ketones UA: NEGATIVE
Nitrite, UA: NEGATIVE
Protein, UR: NEGATIVE
Specific Gravity UA: 1.03 (ref 1.001–1.035)
Urine pH: 6 (ref 5.0–8.0)
Urobilinogen, UA: NORMAL mg/dL

## 2014-02-05 LAB — CBC AND DIFFERENTIAL
Basophils Absolute Automated: 0.04 10*3/uL (ref 0.00–0.20)
Basophils Automated: 1 %
Eosinophils Absolute Automated: 0.33 10*3/uL (ref 0.00–0.70)
Eosinophils Automated: 6 %
Hematocrit: 38.8 % (ref 37.0–47.0)
Hgb: 13.5 g/dL (ref 12.0–16.0)
Lymphocytes Absolute Automated: 2.02 10*3/uL (ref 0.50–4.40)
Lymphocytes Automated: 35 %
MCH: 31.8 pg (ref 28.0–32.0)
MCHC: 34.8 g/dL (ref 32.0–36.0)
MCV: 91.3 fL (ref 80.0–100.0)
MPV: 10.5 fL (ref 9.4–12.3)
Monocytes Absolute Automated: 0.45 10*3/uL (ref 0.00–1.20)
Monocytes: 8 %
Neutrophils Absolute: 2.96 10*3/uL (ref 1.80–8.10)
Neutrophils: 51 %
Platelets: 210 10*3/uL (ref 140–400)
RBC: 4.25 10*6/uL (ref 4.20–5.40)
RDW: 13 % (ref 12–15)
WBC: 5.8 10*3/uL (ref 3.50–10.80)

## 2014-02-05 LAB — COMPREHENSIVE METABOLIC PANEL
ALT: 6 U/L (ref 0–55)
AST (SGOT): 20 U/L (ref 5–34)
Albumin/Globulin Ratio: 1.2 (ref 0.9–2.2)
Albumin: 4.1 g/dL (ref 3.5–5.0)
Alkaline Phosphatase: 65 U/L (ref 37–106)
Anion Gap: 7 (ref 5.0–15.0)
BUN: 13 mg/dL (ref 7.0–19.0)
Bilirubin, Total: 0.5 mg/dL (ref 0.2–1.2)
CO2: 20 mEq/L — ABNORMAL LOW (ref 22–29)
Calcium: 8.8 mg/dL (ref 8.5–10.5)
Chloride: 111 mEq/L (ref 100–111)
Creatinine: 0.8 mg/dL (ref 0.6–1.0)
Globulin: 3.3 g/dL (ref 2.0–3.6)
Glucose: 79 mg/dL (ref 70–100)
Potassium: 4.9 mEq/L (ref 3.5–5.1)
Protein, Total: 7.4 g/dL (ref 6.0–8.3)
Sodium: 138 mEq/L (ref 136–145)

## 2014-02-05 LAB — URINE HCG QUALITATIVE: Urine HCG Qualitative: NEGATIVE

## 2014-02-05 LAB — GFR: EGFR: >60

## 2014-02-05 MED ORDER — CEFTRIAXONE SODIUM 250 MG IJ SOLR
INTRAMUSCULAR | Status: AC
Start: 2014-02-05 — End: 2014-02-05
  Administered 2014-02-05: 250 mg via INTRAMUSCULAR
  Filled 2014-02-05: qty 250

## 2014-02-05 MED ORDER — ONDANSETRON 4 MG PO TBDP
4.0000 mg | ORAL_TABLET | Freq: Four times a day (QID) | ORAL | Status: DC | PRN
Start: 2014-02-05 — End: 2014-06-20

## 2014-02-05 MED ORDER — KETOROLAC TROMETHAMINE 30 MG/ML IJ SOLN
30.0000 mg | Freq: Once | INTRAMUSCULAR | Status: DC
Start: 2014-02-05 — End: 2014-02-05

## 2014-02-05 MED ORDER — DOXYCYCLINE HYCLATE 100 MG PO TABS
100.0000 mg | ORAL_TABLET | Freq: Two times a day (BID) | ORAL | Status: AC
Start: 2014-02-05 — End: 2014-02-19

## 2014-02-05 MED ORDER — ACETAMINOPHEN 325 MG PO TABS
650.0000 mg | ORAL_TABLET | Freq: Once | ORAL | Status: AC
Start: 2014-02-05 — End: 2014-02-05
  Administered 2014-02-05: 650 mg via ORAL
  Filled 2014-02-05: qty 2

## 2014-02-05 MED ORDER — KETOROLAC TROMETHAMINE 30 MG/ML IJ SOLN
15.0000 mg | Freq: Once | INTRAMUSCULAR | Status: AC
Start: 2014-02-05 — End: 2014-02-05
  Administered 2014-02-05: 15 mg via INTRAVENOUS
  Filled 2014-02-05: qty 1

## 2014-02-05 MED ORDER — SODIUM CHLORIDE 0.9 % IV BOLUS
1000.0000 mL | Freq: Once | INTRAVENOUS | Status: AC
Start: 2014-02-05 — End: 2014-02-05
  Administered 2014-02-05: 1000 mL via INTRAVENOUS

## 2014-02-05 MED ORDER — CEFTRIAXONE SODIUM 1 G IJ SOLR
250.0000 mg | Freq: Once | INTRAMUSCULAR | Status: AC
Start: 2014-02-05 — End: 2014-02-05
  Filled 2014-02-05: qty 1000

## 2014-02-05 NOTE — ED Notes (Signed)
Lower abdominal pain with some white, odorless discharge.

## 2014-02-05 NOTE — Discharge Instructions (Signed)
Take advil or Tylenol as needed for pain.    See the gynecologist for follow up within 1 week. Pelvic rest for 2 weeks.    Call [phone] in 2-3 days for pelvic swab test results.    Return to the ED for any fevers, worsening or intractable pain, right sided lower belly pain, or any concerns.

## 2014-02-05 NOTE — ED Provider Notes (Signed)
EMERGENCY DEPARTMENT HISTORY AND PHYSICAL EXAM     Physician/Midlevel provider first contact with patient: 02/05/14 1134         Date: 02/05/2014  Patient Name: Beth Hays    History of Presenting Illness     Chief Complaint   Patient presents with   . Abdominal Pain       History Provided By: Pt    Chief Complaint: suprapubic abdominal pain  Onset: 2 months, worse this week   Timing: intermittent  Location: bilateral suprapubic abdomen   Quality: cramping   Severity:   Modifying Factors: Aleve with some relief  Associated Symptoms: V x 1, white vaginal discharge    Additional History: Beth Hays is a 28 y.o. female with history of endometriosis, fibroids, and ovarian cyst presenting to the ED for intermittent bilateral suprapubic abdominal pain since 2 months ago, worse this week and accompanied by white vaginal discharge and non bloody vomiting x 1 this morning. Pt describes the pain as a cramping that usually comes on prior to her menstrual period. LNMP 01/10/14. She has been taking Aleve with some relief, last taken a few days ago. Pt is sexually active with one partner and condoms are not always used. She reports history of chlamydia at age 34. Pt denies fever, dysuria, hematuria, vaginal bleeding, or any other ill symptoms.    PCP: Pcp, Noneorunknown, MD      Current Facility-Administered Medications   Medication Dose Route Frequency Provider Last Rate Last Dose   . cefTRIAXone (ROCEPHIN) injection 250 mg  250 mg Intramuscular Once Marquette Old, MD         Current Outpatient Prescriptions   Medication Sig Dispense Refill   . ALBUTEROL IN Inhale into the lungs.     . naproxen sodium (ANAPROX) 220 MG tablet Take 220 mg by mouth 2 (two) times daily with meals.     Marland Kitchen doxycycline (VIBRA-TABS) 100 MG tablet Take 1 tablet (100 mg total) by mouth 2 (two) times daily. 28 tablet 0   . Fexofenadine HCl (ALLEGRA PO) Take by mouth.     . ondansetron (ZOFRAN ODT) 4 MG disintegrating tablet Take 1 tablet (4 mg total) by  mouth every 6 (six) hours as needed for Nausea. 8 tablet 0       Past History     Past Medical History:  Past Medical History   Diagnosis Date   . Asthma without status asthmaticus    . Endometriosis    . Fibroids        Past Surgical History:  Past Surgical History   Procedure Laterality Date   . Ovarian cyst removal         Family History:  History reviewed. No pertinent family history.    Social History:  History   Substance Use Topics   . Smoking status: Current Every Day Smoker -- 0.25 packs/day for 10 years   . Smokeless tobacco: Not on file   . Alcohol Use: Yes      Comment: "socially"       Allergies:  Allergies   Allergen Reactions   . Peanuts [Peanut Oil]        Review of Systems     Review of Systems   Constitutional: Negative for fever.   Respiratory: Negative for cough and shortness of breath.    Cardiovascular: Negative for chest pain.   Gastrointestinal: Positive for vomiting (x 1) and abdominal pain (bilateral suprapubic).   Genitourinary: Negative for dysuria and hematuria.        +  White vaginal discharge  Negative for vaginal bleeding   Musculoskeletal: Negative for falls.   Skin: Negative for rash.   Endo/Heme/Allergies:        Allergic to peanuts   All other systems reviewed and are negative.        Physical Exam   BP 122/75 mmHg  Pulse 61  Temp(Src) 98.7 F (37.1 C) (Oral)  Resp 16  Ht 1.676 m  Wt 66.225 kg  BMI 23.58 kg/m2  SpO2 100%  LMP 01/10/2014    Physical Exam   Constitutional: Patient is oriented to person, place, and time and well-developed, well-nourished, and in no distress.   Head: Normocephalic and atraumatic.   Eyes: EOM are normal. Pupils are equal, round, and reactive to light. pink sunconj. No scleral icterus  ENT: OP clear, MMM  Neck: Normal range of motion. Neck supple. No JVD  Cardiovascular: Normal rate and regular rhythm. No murmurs or rubs.  Pulmonary/Chest: Effort normal and breath sounds normal. No respiratory distress.   Abdominal: Soft. Minimal RLQ>LLQ abd  TTP, mild R>L anter pelvic TTP. No TTP McBurney's. No rebound or guarding.  Musculoskeletal: Normal range of motion BLE. No flank TTP  Neurological: Patient is alert and oriented to person, place, and time. GCS score is 15.   Skin: Skin is warm and dry. No torso rash        Diagnostic Study Results     Labs -     Results     Procedure Component Value Units Date/Time    WET PREP TRICHOMONAS [[id number]] Collected:  02/05/14 1234    Specimen Information:  Vaginal Swab Updated:  02/05/14 1312    Narrative:      ORDER#: [id number]                                    ORDERED BY: Aleina Burgio  SOURCE: Vaginal Swab                                 COLLECTED:  02/05/14 12:34  ANTIBIOTICS AT COLL.:                                RECEIVED :  02/05/14 13:07  Wet Prep Trichomonas                       FINAL       02/05/14 13:12  02/05/14   Clue Cells Seen No Trichomonas or Yeast Seen             Reference Range: No Trichomonas or Yeast Seen      Comprehensive metabolic panel [[id number]]  (Abnormal) Collected:  02/05/14 1234    Specimen Information:  Blood Updated:  02/05/14 1306     Glucose 79 mg/dL      BUN 54.2 mg/dL      Creatinine 0.8 mg/dL      Sodium 706 mEq/L      Potassium 4.9 mEq/L      Chloride 111 mEq/L      CO2 20 (L) mEq/L      CALCIUM 8.8 mg/dL      Protein, Total 7.4 g/dL      Albumin 4.1 g/dL      AST (SGOT) 20 U/L  ALT 6 U/L      Alkaline Phosphatase 65 U/L      Bilirubin, Total 0.5 mg/dL      Globulin 3.3 g/dL      Albumin/Globulin Ratio 1.2      Anion Gap 7.0     GFR [[id number]] Collected:  02/05/14 1234     EGFR >60.0 Updated:  02/05/14 1306    CBC and differential [[id number]] Collected:  02/05/14 1234    Specimen Information:  Blood / Blood Updated:  02/05/14 1246     WBC 5.80 x10 3/uL      RBC 4.25 x10 6/uL      Hgb 13.5 g/dL      Hematocrit 66.4 %      MCV 91.3 fL      MCH 31.8 pg      MCHC 34.8 g/dL      RDW 13 %      Platelets 210 x10 3/uL      MPV 10.5 fL      Neutrophils 51 %      Lymphocytes Automated  35 %      Monocytes 8 %      Eosinophils Automated 6 %      Basophils Automated 1 %      Immature Granulocyte Unmeasured %      Neutrophils Absolute 2.96 x10 3/uL      Abs Lymph Automated 2.02 x10 3/uL      Abs Mono Automated 0.45 x10 3/uL      Abs Eos Automated 0.33 x10 3/uL      Absolute Baso Automated 0.04 x10 3/uL      Absolute Immature Granulocyte Unmeasured x10 3/uL     CHLAMYDIA/GC BY PCR [[id number]] Collected:  02/05/14 1234    Specimen Information:  Cervical Swab Updated:  02/05/14 1234    Narrative:      Call Lab first    Urine culture [[id number]] Collected:  02/05/14 1129    Specimen Information:  Urine / Urine, Clean Catch Updated:  02/05/14 1144    UA, Reflex to Microscopic [[id number]]  (Abnormal) Collected:  02/05/14 1129    Specimen Information:  Urine Updated:  02/05/14 1139     Urine Type Clean Catch      Color, UA Yellow      Clarity, UA Slightly Cloudy      Specific Gravity UA 1.030      Urine pH 6.0      Leukocyte Esterase, UA Small (A)      Nitrite, UA Negative      Protein, UR Negative      Glucose, UA Negative      Ketones UA Negative      Urobilinogen, UA Normal mg/dL      Bilirubin, UA Negative      Blood, UA Negative      RBC, UA 0-5 /hpf      WBC, UA 0-5 /hpf      Squamous Epithelial Cells, Urine 6-10 /hpf     Urine HCG, Qualitative [[id number]] Collected:  02/05/14 1129    Specimen Information:  Urine Updated:  02/05/14 1137     Urine HCG Qualitative Negative           Radiologic Studies -   Radiology Results (24 Hour)     Procedure Component Value Units Date/Time    US Transvaginal Only Non-OB [[id number]] Collected:  02/05/14 1234    Order Status:  Completed Updated:  02/05/14 1243    Narrative:  CLINICAL HISTORY: Pelvic pain    TECHNIQUE: The pelvis was studied with transvaginal sonography.     COMPARISON: None available    FINDINGS: The uterus measures 5.8 x 4.9 x 4.0 cm. The endometrial stripe  measures 7 mm. The canal is empty. Anterior fundal fibroid present  measuring up to 2.2  x 2.1 cm. The right ovary measures 2.1 cm and the  left 3.7 cm. There is no abnormal adnexal mass. Cul-de-sac free fluid is  present.    Color and spectral Doppler evaluation reveals arterial and venous flow  to each ovary.      Impression:       Anterior fibroid. Normal ovaries. Cul-de-sac free fluid. No  Doppler evidence of ovarian torsion.       Heron Nay, MD   02/05/2014 12:39 PM      [place] Abd/Pelvis Doppler (Add-on for torsion) [[id number]] Collected:  02/05/14 1234    Order Status:  Completed Updated:  02/05/14 1243    Narrative:      CLINICAL HISTORY: Pelvic pain    TECHNIQUE: The pelvis was studied with transvaginal sonography.     COMPARISON: None available    FINDINGS: The uterus measures 5.8 x 4.9 x 4.0 cm. The endometrial stripe  measures 7 mm. The canal is empty. Anterior fundal fibroid present  measuring up to 2.2 x 2.1 cm. The right ovary measures 2.1 cm and the  left 3.7 cm. There is no abnormal adnexal mass. Cul-de-sac free fluid is  present.    Color and spectral Doppler evaluation reveals arterial and venous flow  to each ovary.      Impression:       Anterior fibroid. Normal ovaries. Cul-de-sac free fluid. No  Doppler evidence of ovarian torsion.       Heron Nay, MD   02/05/2014 12:39 PM        .      Medical Decision Making   I am the first provider for this patient.    I reviewed the vital signs, available nursing notes, past medical history, past surgical history, family history and social history.    Vital Signs-Reviewed the patient's vital signs.     Patient Vitals for the past 12 hrs:   BP Temp Pulse Resp   02/05/14 1320 122/75 mmHg - 61 16   02/05/14 1115 128/90 mmHg 98.7 F (37.1 C) 89 16       Pulse Oximetry Analysis - Normal 100% on RA    Old Medical Records: Nursing notes.     ED Course:     12:57 PM - Pelvic exam performed. No external lesions. Small amount of white vaginal discharge. No CMT. Minimal R adnexal tenderness to palpation. No L adnexal tenderness. Discussed  starting ABx for possible PID. Pt agrees. Pain improved.    1:23 PM - Pt is feeling better and would like to go home.  Discussed test results with pt and counseled on diagnosis, f/u plans, and signs and symptoms when to return to ED.  Pt is stable and ready for discharge after dose of Rocephin. Repeat abd exam reveals soft abd without TTP, has minimal R anter pelvic TTP    Core Measures:   Urine hCG, Qualitative, ordered in the ED.    Provider Notes: nonpregnant woman here for few mths intermittent pelvic pain attributed in past to endometriosis, with exacerbation thsi week. No dysuria or hematuria, so doubt UTI. UA with small LE with sq so likely contaminant; will send  UCX. Will check pelvic exam for PID, pelvic sono to eval for ovarian torsion vs cysts    Diagnosis     Clinical Impression:   1. Pelvic pain in female    2. Uterine leiomyoma, unspecified location        _______________________________    Attestations:  This note is prepared by Jana Hakim, acting as Scribe for Marquette Old, M.D.    Marquette Old, M.D.: The scribe's documentation has been prepared under my direction and personally reviewed by me in its entirety.  I confirm that the note above accurately reflects all work, treatment, procedures, and medical decision making performed by me.       _______________________________        Marquette Old, MD  02/07/14 1630

## 2014-02-06 NOTE — Progress Notes (Signed)
Quick Note:    Culture negative, no further action needed.  ______

## 2014-02-07 NOTE — Progress Notes (Signed)
Quick Note:    Negative culture. No follow-up needed.  ______

## 2014-04-12 LAB — CYTOLOGY - PAP: PAP SMEAR: NEGATIVE

## 2014-04-23 ENCOUNTER — Emergency Department: Payer: Medicaid Other

## 2014-04-23 ENCOUNTER — Emergency Department: Payer: Self-pay

## 2014-04-23 ENCOUNTER — Emergency Department
Admission: EM | Admit: 2014-04-23 | Discharge: 2014-04-23 | Disposition: A | Discharge status: Home / Self Care | Payer: Medicaid Other | Attending: Emergency Medicine | Admitting: Emergency Medicine

## 2014-04-23 DIAGNOSIS — F17210 Nicotine dependence, cigarettes, uncomplicated: Secondary | ICD-10-CM | POA: Insufficient documentation

## 2014-04-23 DIAGNOSIS — J9801 Acute bronchospasm: Secondary | ICD-10-CM | POA: Insufficient documentation

## 2014-04-23 DIAGNOSIS — F419 Anxiety disorder, unspecified: Secondary | ICD-10-CM | POA: Insufficient documentation

## 2014-04-23 DIAGNOSIS — J45909 Unspecified asthma, uncomplicated: Secondary | ICD-10-CM | POA: Insufficient documentation

## 2014-04-23 MED ORDER — ALBUTEROL SULFATE (2.5 MG/3ML) 0.083% IN NEBU
5.00 mg | INHALATION_SOLUTION | Freq: Once | RESPIRATORY_TRACT | Status: AC
Start: 2014-04-23 — End: 2014-04-23
  Administered 2014-04-23: 5 mg via RESPIRATORY_TRACT
  Filled 2014-04-23: qty 6

## 2014-04-23 MED ORDER — ALBUTEROL SULFATE HFA 108 (90 BASE) MCG/ACT IN AERS
2.0000 | INHALATION_SPRAY | RESPIRATORY_TRACT | Status: AC | PRN
Start: 2014-04-23 — End: 2014-05-23

## 2014-04-23 MED ORDER — IPRATROPIUM BROMIDE 0.02 % IN SOLN
0.50 mg | Freq: Once | RESPIRATORY_TRACT | Status: AC
Start: 2014-04-23 — End: 2014-04-23
  Administered 2014-04-23: 0.5 mg via RESPIRATORY_TRACT
  Filled 2014-04-23: qty 2.5

## 2014-04-23 NOTE — ED Provider Notes (Signed)
EMERGENCY DEPARTMENT HISTORY AND PHYSICAL EXAM     Physician/Midlevel provider first contact with patient: 04/23/14 1115         Date: 04/23/2014  Patient Name: Beth Hays    History of Presenting Illness     Chief Complaint   Patient presents with   . Asthma       History Provided By: Patient     Chief Complaint: Difficulty breathing   Onset: Just PTA  Timing: Worsening   Location: Chest   Quality: tightness  Severity: Moderate-severe  Modifying Factors: No relief with 4 puffs of her inhaler   Associated Symptoms: +cough, chest tightness, sweating, anxious, lightheadedness    Additional History: Beth Hays is a 28 y.o. female with a history of asthma . She presents to the ED with difficulty breathing that started this AM, just PTA. Patient states that she started to feel sweaty while in class that was followed shortly by coughing. The coughing then caused chest tightness, which increased her anxiety, causing more difficulty breathing. She took a total of 4 puffs from her inhaler; reports chest tightness is now gone, but she feels lightheaded, anxious and shaky. Reports symptoms are similar to previous asthma attacks.     PCP: Pcp, Noneorunknown, MD  Specialist: Unknown       No current facility-administered medications for this encounter.     Current Outpatient Prescriptions   Medication Sig Dispense Refill   . albuterol (PROVENTIL HFA;VENTOLIN HFA) 108 (90 BASE) MCG/ACT inhaler Inhale 2 puffs into the lungs every 4 (four) hours as needed for Wheezing or Shortness of Breath (coughing). Dispense with spacer 1 Inhaler 0   . ALBUTEROL IN Inhale into the lungs.     Marland Kitchen Fexofenadine HCl (ALLEGRA PO) Take by mouth.     . naproxen sodium (ANAPROX) 220 MG tablet Take 220 mg by mouth 2 (two) times daily with meals.     . ondansetron (ZOFRAN ODT) 4 MG disintegrating tablet Take 1 tablet (4 mg total) by mouth every 6 (six) hours as needed for Nausea. 8 tablet 0       Past History     Past Medical History:  Past Medical  History   Diagnosis Date   . Asthma without status asthmaticus    . Endometriosis    . Fibroids        Past Surgical History:  Past Surgical History   Procedure Laterality Date   . Ovarian cyst removal         Family History:  History reviewed. No pertinent family history.    Social History:  History   Substance Use Topics   . Smoking status: Current Every Day Smoker -- 0.25 packs/day for 10 years   . Smokeless tobacco: Not on file   . Alcohol Use: Yes      Comment: "socially"       Allergies:  Allergies   Allergen Reactions   . Peanuts [Peanut Oil]        Review of Systems     Review of Systems   Constitutional: Positive for diaphoresis.   Respiratory: Positive for cough, sputum production, shortness of breath and wheezing.    Cardiovascular: Positive for chest pain (tightness).   Neurological: Positive for dizziness (and lightheadedness).   Psychiatric/Behavioral: The patient is nervous/anxious.      Physical Exam   BP 128/66 mmHg  Pulse 76  Temp(Src) 98.6 F (37 C)  Resp 18  Ht 1.702 m  Wt 66.225 kg  BMI 22.86  kg/m2  SpO2 100%  LMP 04/13/2014    Physical Exam   Constitutional: She is oriented to person, place, and time. She appears distressed ( anxious, tachypneic).   HENT:   Head: Normocephalic and atraumatic.   Mouth/Throat: No oropharyngeal exudate.   Eyes: EOM are normal. Pupils are equal, round, and reactive to light.   Neck: Normal range of motion. Neck supple.   Cardiovascular: Normal rate, regular rhythm, normal heart sounds and intact distal pulses.    Pulmonary/Chest: Effort normal and breath sounds normal. No respiratory distress. She has no wheezes. She has no rales.   Musculoskeletal: Normal range of motion. She exhibits no edema or tenderness.   No calf pain   Neurological: She is alert and oriented to person, place, and time. No cranial nerve deficit. Gait normal. Coordination normal. GCS score is 15.   Skin: Skin is warm and dry. She is not diaphoretic.   Psychiatric: Mood, memory and  judgment normal.   anxious   Nursing note and vitals reviewed.        Diagnostic Study Results     Labs -     Results     ** No results found for the last 24 hours. **          Radiologic Studies -   Radiology Results (24 Hour)     Procedure Component Value Units Date/Time    Chest AP Portable [[id number]] Collected:  04/23/14 1150    Order Status:  Completed Updated:  04/23/14 1154    Narrative:      INDICATION: Dyspnea.    TECHNIQUE: One view.    FINDINGS: No acute pulmonary infiltrates are demonstrated.  The  cardiomediastinal silhouette is within normal limits.  The costophrenic  angles are clear.      Impression:       No acute pulmonary infiltrates are demonstrated.     Merri Ray, MD   04/23/2014 11:50 AM        .      Medical Decision Making   I am the first provider for this patient.    I reviewed the vital signs, available nursing notes, past medical history, past surgical history, family history and social history.    Vital Signs-Reviewed the patient's vital signs.     Patient Vitals for the past 12 hrs:   BP Temp Pulse Resp   04/23/14 1224 128/66 mmHg - 76 18   04/23/14 1110 137/88 mmHg 98.6 F (37 C) 86 (!) 28       Pulse Oximetry Analysis - Normal, 100% on RA    Old Medical Records: Old medical records.      EKG:  Interpreted by the Emergency Physician.   Time Interpreted: 11:16 AM   Rate: 82   Rhythm: Normal Sinus Rhythm    Interpretation: LVH. No ST abnormalities. Normal intervals and axis.    Comparison: No change compared to prior EKG dated May 2015    ED Course:     11:18 AM -   Tightness resolved, just lightheaded and shaky. Discussed with the patient the plan for in the ED including CXR and breathing treatment. She is agreeable.     12:15 PM -   Patient resting in bed, sleeping comfortably. Lungs clear to auscultation.     12:27 PM -   Discussed ddx and treatment plan. Updated on CXR results. Answered all questions for the patient. She is agreeable to all.     Provider Notes:  DDx- asthma,  bronchospasm, anxiety, PE  Pt presents with acute SOB, likely bronchospasm vs anxiety, pt observed with improvement of symptoms after duoneb, PERC negative unlikely PE, pt stable for discharge.    Diagnosis     Clinical Impression:   1. Bronchospasm    2. Anxiety        _______________________________    Attestations:  This note is prepared by Letta Moynahan, acting as Scribe for French Ana, MD    French Ana, MD:  The scribe's documentation has been prepared under my direction and personally reviewed by me in its entirety.  I confirm that the note above accurately reflects all work, treatment, procedures, and medical decision making performed by me.    _______________________________          French Ana, MD  04/24/14 [phone]

## 2014-04-23 NOTE — ED Notes (Signed)
Asthma attack starting today. Pt has had a cold for the past week and pt unable to control wheezing, chest tightness and thick mucus. Pt tearful and anxious.

## 2014-04-23 NOTE — Discharge Instructions (Signed)
Bronchospasm    You have been seen for an episode of bronchospasm.    Bronchospasm happens with asthma. However, it seems this episode was caused by a different problem.    Bronchospasm is when the breathing tubes get irritated and spasm. The spasm traps air inside. This makes it hard to exhale (breathe out). There is some inflammation (swelling). This increases the amount of mucus normally produced by the breathing tubes. Some medicine treats the spasm. This includes bronchodilators like albuterol (Ventolin/Proventil). Others treat the inflammation. This includes steroids like prednisone (Prelone/Orapred).   Some symptoms are: Wheezing, trouble breathing and chest pain and tightness. Some patients do not wheeze, but may have a dry cough.    Things that can cause bronchospasm include: Tobacco smoke, air pollution, dust mites, pet dander and fumes from certain irritating chemicals. Also upper and lower respiratory tract infections (colds, bronchitis, pneumonia). Some people get bronchospasm when they exercise, especially in cold weather.    Avoid known irritants. Some of the most common ones are tobacco smoke, pollen, dust, mold, mildew, pets and perfumes. Do not smoke. If you have a pollen allergy, stay indoors when pollen counts are highest.    YOU SHOULD SEEK MEDICAL ATTENTION IMMEDIATELY, EITHER HERE OR AT THE NEAREST EMERGENCY DEPARTMENT, IF ANY OF THE FOLLOWING OCCURS:   No improvement in 48 to 72 hours or symptoms get worse.   You are vomiting or can t keep medicines down. Dizziness, weakness or confusion.   Shortness of breath gets worse.   Needing to use the inhaler more often than prescribed.   Chest pain.   Coughing up blood.   Fever (temperature higher than 100.72F / 38C)   You have trouble breathing.              Anxiety, Panic    You have been diagnosed with an anxiety attack.    You seem to have had an anxiety attack. There are many conditions that can cause symptoms like these.  If this is the first time this has happened, follow-up with your regular doctor. You may need more testing to be sure there isn't another cause for your symptoms.    Anxiety causes very strong feelings of worry and fear. It may also cause chest pain or shortness of breath. You may feel like you have palpitations (a racing heart). You might feel numbness (like parts of your body are "asleep"), especially around the mouth and in the hands or feet.    Follow up with your counselor and family doctor. If you do not have an appointment in the next 2-3 days, call and make one. It is VERY IMPORTANT for your counselor and family doctor to know if you get worse.    YOU SHOULD SEEK MEDICAL ATTENTION IMMEDIATELY, EITHER HERE OR AT THE NEAREST EMERGENCY DEPARTMENT, IF ANY OF THE FOLLOWING OCCURS:   You have symptoms that you normally don't have with your anxiety attacks.   You think of harming yourself (suicidal thoughts) or harming someone else.   You have symptoms you normally don't have and they last longer than normal or your medicine doesn t help. These include chest pain, passing out, feeling that your heart is racing or shortness of breath.   You have a fever (temperature higher than 100.72F / 38C).    Low Cost Healthcare Resources in [facility]/Federally Qualified Health Centers    [facility]   www.https://www.krueger.org/    Adult Medicine  and [facility]  [address]  [place], [place] [address]   [phone]    Pediatrics, [address] [place], [place] [address], [phone]    [facility] Support and Mental Health Services, [phone]     ---------------------------------------------------------------------------------------  [facility], [address] [place], [place] [address], [phone]  [email or web address].pl    [facility] and [facility] [place] [facility], [address], [place], [place] [address],  [phone]         ---------------------------------------------------------------------------------------  ([facility], [facility] [place] [facility] [address], [place], [place] [address], [phone], [address] [place], [place] [address], [phone]    ---------------------------------------------------------------------------------------    Health Departments    ---------------------------------------------------------------------------------------    [facility], [address], [place], [place] [address], [phone]  www.[email or web address]    [facility], [address] [place], [place] [address], [phone]  [email or web address]    ---------------------------------------------------------------------------------------  [facility], [address], [place] [address], [phone]    ---------------------------------------------------------------------------------------  [facility], [address], [place], [place] [address], [phone]    [facility], [address], [place], [place] [address], [phone]  [email or web address]    [facility], [address], [place], [place] [address], [phone], [address], [place], [place] [address], [phone]    [facility], [address], [place], [place] [address], [phone]    ---------------------------------------------------------------------------------------  [facility], [address]st Floor, [place], [place] [address], [phone]    ---------------------------------------------------------------------------------------  [facility] Department, [address], [place], [place]  [address], [phone]    [facility], [phone]    ---------------------------------------------------------------------------------------  [facility], [address], [place], [place] [address], [phone]  [facility], [address], [place], [place] [address], [phone]    [facility], [address] [place], [place] [address], [phone], [address] [place], [place], [place] [address], [phone]    ---------------------------------------------------------------------------------------    Free Clinics    ---------------------------------------------------------------------------------------  ---------------------------------------------------------------------------------------  [facility],   InvestmentInstructor.be    [facility],  http://garcia-[email or web address]    [facility],   MobileCamcorder.[email or web address].br    [facility], www.TanPlex.uy    [employer], www.vafreeclinics.org/[place]-free-clinics.asp#culc    ---------------------------------------------------------------------------------------    Other Resources    ---------------------------------------------------------------------------------------  ---------------------------------------------------------------------------------------  [facility], [address], [place], [place] [address], [phone]  www.[email or web address], [address], [address] [place], [place] [address], [phone]  MakePoetry.hu    [facility], [address] [place], [place], [place] [address],  [phone]    ---------------------------------------------------------------------------------------    Dental    ---------------------------------------------------------------------------------------  ---------------------------------------------------------------------------------------  [facility], [address], [address] [place], [place] [address], [phone]    [facility], [address], 1st Floor, [place], [place] [address], [phone]  By appointment only    [facility]  [address] [place], [place] [address], [phone]    [facility]  [address] [place], [place], [phone]  [address], [place], [place], [phone]  [address], [place]. [place], [place], [phone]    Criss Alvine  [facility], [address], [place], [place], [phone]       ---------------------------------------------------------------------------------------  Health Insurance Locators    ---------------------------------------------------------------------------------------  ---------------------------------------------------------------------------------------  [employer] Kauf[employer] for Health Coverage Education, http://www.wolf.info/    [employer],   http://www.ehealthinsurance.com    [employer],   http://www.healthinsurancesort.com    Health Insurance Online,   http://www.online-health-insurance.com    Health Plan One,   http://www.healthplanone.com    http://www.long-jenkins.com/,    http://www.healthcare.gov    [employer], http://www.novaclinics.org/home  [facility])    ---------------------------------------------------------------------------------------    Medication  Resources    ---------------------------------------------------------------------------------------  ---------------------------------------------------------------------------------------  [employer], http://www.fairfaxrxdiscountcard.com, [phone], EXT 5 Helpdesk    Partnership for Prescriptions Assistance,  http://www.pparx.org    NeedyMeds,       http://www.needymeds.[employer],    [phone]    [employer],      https://www.farmer-stevens.info/    [employer] of Location manager (NAIC),      http://[email or web address]    [employer],     CanineCocktail.co.nz

## 2014-04-24 LAB — ECG 12-LEAD
Atrial Rate: 82 {beats}/min
P Axis: 7 degrees
P-R Interval: 152 ms
Q-T Interval: 360 ms
QRS Duration: 74 ms
QTC Calculation (Bezet): 420 ms
R Axis: -7 degrees
T Axis: 34 degrees
Ventricular Rate: 82 {beats}/min

## 2014-05-23 NOTE — L&D Delivery Note (Signed)
Delivery Note  First Stage: Labor onset: 0920 on 8-22 Augmentation : pitocin Analgesia /Anesthesia intrapartum: Epidural SROM at 0920  Second Stage: Complete dilation at 2026 Onset of pushing at 2040  Delivery of a viable infant female at 2056 by Ardean Larsen, SNM in OA  position 1 loose nuchal cord Cord double clamped after cessation of pulsation, cut by grandmother Cord blood sample collected  Collection of cord blood donation: Yes  Infant pulse at delivery greater than 100 with well-flexed extremities and cry after stimulation.   Third Stage: Placenta delivered [facility] intact with 3 VC @ 2111 Placenta disposition: hospital  Uterine tone firm / bleeding mild   Periurethral laceration identified and vaginal pocket laceration repaired with 3-0 vicryl Est. Blood Loss (mL): 350   Mom to postpartum.  Baby to Couplet care / Skin to Skin.  Newborn: Birth Weight: 7 lbs 8 oz  Apgar Scores: 8/9 Feeding planned: breast   At 10 minutes post delivery baby became dusky and had secondary apnea. Baby immediately taken to warmer.  4 ml of fluid suctioned from trachea and PPV provided.  Code APGAR called; neonatal team in the room. Infant stabilized and placed on mom's chest.   Ariana Young [facility] 01/12/2015, 10:06 PM    I was gloved and present at bedside for entire delivery  Agree with note There was no difficulty with shoulders  Lacerations repaired in usual fashion Baby attended by RNs and Code Apgar team, resolved.   Ariana Young, CNM

## 2014-06-20 ENCOUNTER — Emergency Department
Admission: EM | Admit: 2014-06-20 | Discharge: 2014-06-20 | Disposition: A | Discharge status: Home / Self Care | Payer: Medicaid Other | Attending: Emergency Medicine | Admitting: Emergency Medicine

## 2014-06-20 ENCOUNTER — Emergency Department: Payer: Medicaid Other

## 2014-06-20 DIAGNOSIS — J069 Acute upper respiratory infection, unspecified: Secondary | ICD-10-CM | POA: Insufficient documentation

## 2014-06-20 DIAGNOSIS — N83202 Unspecified ovarian cyst, left side: Secondary | ICD-10-CM

## 2014-06-20 DIAGNOSIS — O99331 Smoking (tobacco) complicating pregnancy, first trimester: Secondary | ICD-10-CM | POA: Insufficient documentation

## 2014-06-20 DIAGNOSIS — O26891 Other specified pregnancy related conditions, first trimester: Secondary | ICD-10-CM | POA: Insufficient documentation

## 2014-06-20 DIAGNOSIS — Z3490 Encounter for supervision of normal pregnancy, unspecified, unspecified trimester: Secondary | ICD-10-CM

## 2014-06-20 DIAGNOSIS — J45909 Unspecified asthma, uncomplicated: Secondary | ICD-10-CM | POA: Insufficient documentation

## 2014-06-20 DIAGNOSIS — R0789 Other chest pain: Secondary | ICD-10-CM | POA: Insufficient documentation

## 2014-06-20 DIAGNOSIS — O3481 Maternal care for other abnormalities of pelvic organs, first trimester: Secondary | ICD-10-CM | POA: Insufficient documentation

## 2014-06-20 LAB — URINALYSIS, REFLEX TO MICROSCOPIC EXAM IF INDICATED
Bilirubin, UA: NEGATIVE
Blood, UA: NEGATIVE
Glucose, UA: NEGATIVE
Ketones UA: NEGATIVE
Leukocyte Esterase, UA: NEGATIVE
Nitrite, UA: NEGATIVE
Protein, UR: NEGATIVE
Specific Gravity UA: 1.01 (ref 1.001–1.035)
Urine pH: 7 (ref 5.0–8.0)
Urobilinogen, UA: NORMAL mg/dL

## 2014-06-20 MED ORDER — ACETAMINOPHEN 325 MG PO TABS
650.0000 mg | ORAL_TABLET | Freq: Once | ORAL | Status: AC
Start: 2014-06-20 — End: 2014-06-20
  Administered 2014-06-20: 650 mg via ORAL
  Filled 2014-06-20: qty 2

## 2014-06-20 MED ORDER — ALBUTEROL SULFATE (2.5 MG/3ML) 0.083% IN NEBU
2.5000 mg | INHALATION_SOLUTION | Freq: Once | RESPIRATORY_TRACT | Status: AC
Start: 2014-06-20 — End: 2014-06-20
  Administered 2014-06-20: 2.5 mg via RESPIRATORY_TRACT
  Filled 2014-06-20: qty 3

## 2014-06-20 NOTE — ED Notes (Signed)
Pt [redacted] weeks pregnant, Co of chest tightness, no difficulty breathing (hx asthma), headache, stomach cramps, Vomiting since last night, no diarrhea, no fevers. Denies any vaginal bleeding.

## 2014-06-20 NOTE — ED Provider Notes (Signed)
EMERGENCY DEPARTMENT HISTORY AND PHYSICAL EXAM     Physician/Midlevel provider first contact with patient: 06/20/14 1135         Date: 06/20/2014  Patient Name: Beth Hays    History of Presenting Illness     Chief Complaint   Patient presents with   . Cough       History Provided By: Patient    Chief Complaint: Cough  Timing: Intermittent  Quality: Productive  Severity: Moderate  Associated Symptoms: Congestion, CP, abd pain, nausea/vomiting, vaginal discharge    Additional History: Blanche Scovell is a 29 y.o. female pt with history of asthma c/o productive cough with mucus that she describes as thick and yellow with associated congestion and chest pain that she describes as tightness. Pt states she was nervous about using her inhaler because she is ~[redacted] weeks pregnant. G1P0A0. Pt also c/o left sided suprapubic pain that she describes as cramping and similar to previous ovarian cysts, nausea, vomiting, vaginal discharge that she describes as slightly thicker than usual but malodorous. Pt was seen at [facility] 3 weeks ago where she had [place] which confirmed IUP. She reports left sided ovarian cyst on that sono as well. Patient denies vaginal bleeding, dysuria, hematuria, fever, chills, sore throat    PCP: Pcp, Noneorunknown, MD    No current facility-administered medications for this encounter.     Current Outpatient Prescriptions   Medication Sig Dispense Refill   . ALBUTEROL IN Inhale into the lungs.         Past History     Past Medical History:  Past Medical History   Diagnosis Date   . Asthma without status asthmaticus    . Endometriosis    . Fibroids        Past Surgical History:  Past Surgical History   Procedure Laterality Date   . Ovarian cyst removal         Family History:  History reviewed. No pertinent family history.    Social History:  History   Substance Use Topics   . Smoking status: Current Some Day Smoker -- 0.25 packs/day for 10 years     Types: Cigarettes   . Smokeless tobacco: Not on file    . Alcohol Use: Yes      Comment: "socially"       Allergies:  Allergies   Allergen Reactions   . Peanuts [Peanut Oil]        Review of Systems     Review of Systems   Constitutional: Negative for fever and chills.   HENT: Positive for congestion. Negative for sore throat.    Respiratory: Positive for cough and wheezing.    Cardiovascular: Positive for chest pain.   Gastrointestinal: Positive for nausea, vomiting and abdominal pain.   Genitourinary: Negative for dysuria and hematuria.        Vaginal discharge and pregnancy. No vaginal bleeding   Musculoskeletal: Negative for back pain.   Skin: Negative for rash.   Endo/Heme/Allergies:        NKDA   Psychiatric/Behavioral: Negative for suicidal ideas and substance abuse.       Physical Exam   BP 120/77 mmHg  Pulse 70  Temp(Src) 98.3 F (36.8 C)  Resp 18  Ht 5\' 6"  (1.676 m)  Wt 68.04 kg  BMI 24.22 kg/m2  SpO2 100%  PF 350 L/min  LMP 04/12/2014 (Approximate)    Physical Exam   Constitutional: She is oriented to person, place, and time. She appears well-developed  and well-nourished.   HENT:   Head: Normocephalic and atraumatic.   Eyes: Conjunctivae are normal. No scleral icterus.   Cardiovascular: Normal rate, regular rhythm and normal heart sounds.  Exam reveals no gallop and no friction rub.    No murmur heard.  Pulmonary/Chest: Effort normal and breath sounds normal. No respiratory distress. She has no wheezes. She has no rales. She exhibits no tenderness.   Abdominal: Soft. She exhibits no distension and no mass. There is tenderness. There is no rebound and no guarding.   Mild LLQ tenderness    Musculoskeletal: Normal range of motion. She exhibits no edema.   Neurological: She is alert and oriented to person, place, and time.   Skin: Skin is warm and dry. No rash noted.   Psychiatric: She has a normal mood and affect. Her behavior is normal. Judgment and thought content normal.   Nursing note and vitals reviewed.      Diagnostic Study Results     Labs -      Results     Procedure Component Value Units Date/Time    UA, Reflex to Microscopic [[id number]] Collected:  06/20/14 1154    Specimen Information:  Urine Updated:  06/20/14 1323     Urine Type Clean Catch      Color, UA Yellow      Clarity, UA Clear      Specific Gravity UA 1.010      Urine pH 7.0      Leukocyte Esterase, UA Negative      Nitrite, UA Negative      Protein, UR Negative      Glucose, UA Negative      Ketones UA Negative      Urobilinogen, UA Normal mg/dL      Bilirubin, UA Negative      Blood, UA Negative     Influenza A/B Virus Antigen [[id number]] Collected:  06/20/14 1154    Specimen Information:  Nasopharyngeal / Nasal Aspirate Updated:  06/20/14 1213    Narrative:      ORDER#: [id number]                                    ORDERED BY: Wyllow Seigler  SOURCE: Nasal Aspirate                               COLLECTED:  06/20/14 11:54  ANTIBIOTICS AT COLL.:                                RECEIVED :  06/20/14 11:58  Influenza Rapid Antigen A&B                FINAL       06/20/14 12:12  06/20/14   Negative for Influenza A and B             Reference Range: Negative            Radiologic Studies -   Radiology Results (24 Hour)     ** No results found for the last 24 hours. **      .      Medical Decision Making   I am the first provider for this patient.    I reviewed the vital signs, available nursing notes, past medical history, past surgical history,  family history and social history.    Vital Signs: Reviewed the patient's vital signs.     Patient Vitals for the past 12 hrs:   BP Temp Pulse Resp   06/20/14 1130 120/77 mmHg 98.3 F (36.8 C) 70 18       Pulse Oximetry Analysis: Normal 100% on RA    EKG:  Interpreted by the Emergency Physician.   Time Interpreted: 11:53 AM    Rate: 68 bpm   Rhythm: Normal Sinus Rhythm    Interpretation: Normal axis. Normal intervals. No ischemic changes   Comparison: Significantly improved compared to Dec 2015    Old Medical Records: Nursing notes.     ED Course:   11:44 AM  -  Discussed plan for labs and EKG     12:36 PM -  Checked on patient who is resting comfortably and states she is feeling significantly better. Chest tightness completely resolved at this time.     1:06 PM -  Called lab for update on the status of UA. Per tech, UA machine was down but sample is now running.     1:32 PM - Results and home self care discussed with patient. Discharge instructions and return precautions also explained. All questions addressed and patient is amenable to discharge.     Provider Notes: known left sided ovarian cyst, confirmed IUP, no VB, cough and chest tightness improved with neb treatment. Pt given list of medications that are safe in pregnancy. Given list of OB/GYNs. Will return for any worsening.      Diagnosis     Clinical Impression:   1. Acute URI    2. Chest tightness    3. Pregnancy    4. Left ovarian cyst        Treatment Plan:   ED Disposition     Discharge Jasey Scarlata discharge to home/self care.    Condition at disposition: Stable          _______________________________    Attestations: This note is prepared by Silva Bandy acting as scribe for Kathryne Hitch, MD     Kathryne Hitch, MD - The scribe's documentation has been prepared under my direction and personally reviewed by me in its entirety.  I confirm that the note above accurately reflects all work, treatment, procedures, and medical decision making performed by me.      _______________________________             Kathryne Hitch, MD  06/20/14 1910

## 2014-06-20 NOTE — Discharge Instructions (Signed)
Upper Respiratory Infection    You have been diagnosed with a viral upper respiratory infection, often called a "URI."    The symptoms of a viral upper respiratory infection may include fever (temperature higher than 100.12F / 38C), runny nose, congestion and sinus fullness, facial pain, earache, sore throat, cough, and occasionally wheezing. The symptoms usually improve within 3 to 4 days. It might take up to 10 days before you feel completely better.    URI's are treated with fluids, rest, and medication for fever and pain. Sometimes decongestants, cough medications or-medications for wheezing can also help.    Antibiotics have NO effect whatsoever on viruses and ARE NOT needed for a URI. Taking antibiotics when they are not necessary can cause side effects (like diarrhea or allergic reactions). It can also cause resistance, meaning antibiotics won't work when you need them in the future.    You do not need to follow up with a doctor unless you feel worse or have other concerns.    YOU SHOULD SEEK MEDICAL ATTENTION IMMEDIATELY, EITHER HERE OR AT THE NEAREST EMERGENCY DEPARTMENT, IF ANY OF THE FOLLOWING OCCURS:   You have new or worse symptoms or concerns.   You are short of breath.   You have a severe headache, stiff neck, confusion, or problems thinking.   You have nasal discharge, fever (temperature higher than 100.12F / 38C), or productive cough (one that brings up mucous from the lungs) lasting more that 10 days. Occasionally a viral cold may lead into a bacterial infection, such as a sinus infection, ear infection or pneumonia. Taking antibiotics during the cold will NOT prevent these infections, but if a secondary infection occurs, you might require additional treatment.      Ovarian Cyst    You have been diagnosed with an ovarian cyst.    Ovarian cysts are common in women with abdominal (belly) pain, pelvic pain or cramping.    Ovarian cysts are like small bubbles filled with fluid. These  cysts are also called "ovarian follicles." They form in the ovary during a normal menstrual cycle (period). During your cycle, an egg ripens inside the cyst. It is getting ready for ovulation. Sometimes a follicle gets large or many follicles form. This can cause pelvic pain or cramps.    If the cyst gets too big, it may break open. When this happens, it releases blood and fluid into the abdomen (belly). This causes sudden severe pain. The pain often gets better within a few hours with no treatment. Some people need pain medicine.    A few women have a condition called polycystic ovarian disease. In these women, several cysts form on both ovaries during the menstrual cycle. This causes irregular (unpredictable) menstrual periods. This condition is caused by hormones.    To diagnose polycystic ovarian disease, a doctor usually examines a woman's pelvis and asks questions about her symptoms. If the doctor cannot feel the cysts with his or her hands, an ultrasound may be needed.    Emergency ultrasound testing for cysts is rarely needed. Instead, the test is usually during regular weekday office hours in the radiology department. The medical staff will schedule the test in the next few days. You may also see your regular doctor instead to schedule the ultrasound.    The doctor has decided it is OK for you to go home.    If symptoms signal complications like infection or bleeding, you may need to come here or to the nearest Emergency Department.  Follow up with your gynecologist or regular doctor in the next few days. Tell your doctor about this visit.   If you do not have a doctor or clinic to follow up with, tell the medical staff before leaving today. We can help make the arrangements for you.    YOU SHOULD SEEK MEDICAL ATTENTION IMMEDIATELY, EITHER HERE OR AT THE NEAREST EMERGENCY DEPARTMENT, IF ANY OF THE FOLLOWING OCCURS:   You have worse pain in the abdomen (belly), pelvis or back.   More and more  vaginal discharge or bleeding, or passing large blood clots.   Fever (temperature higher than 100.65F / 38C), chills, nausea, vomiting (throwing up).   Feeling dizzy, lightheaded or passing out.      Pregnancy    It has been determined you are pregnant.    You originally came here for another problem. However, pregnancy will affect how we evaluate and treat your medical condition.    You have been referred to an obstetrician [facility] doctor). Call to make your appointment today. Close follow-up with an OB doctor is very important for you and your baby.    Get follow-up care as soon as possible, certainly within the next few days. Inform your doctor or obstetrician of your visit here and your pregnancy.    If you smoke, stop. This is necessary to protect yourself and the health of your fetus (developing baby). Smoking makes miscarriage (losing your pregnancy) more likely.    Abusing alcohol or drugs will hurt the fetus (developing baby). It may cause a miscarriage, abnormal development or premature birth. It can also cause severe health or mental problems after birth. Avoid these substances from now until the end of your pregnancy.    If taking pain medicines, antipsychotics, antidepressants or seizure medicine, check with your doctor as soon as possible. Ask if you should continue with these medicines.    Return here or go to the nearest Emergency Department right away if you have: Pain or cramping in the abdomen (belly) or pelvis, vaginal bleeding, nausea/vomiting, dizziness/lightheadedness or passing out.    Keep yourself well-hydrated at all times. Drink a lot of fluids and eat a balanced, healthy, nutritious diet.    Take prenatal vitamins every day. These may be prescribed for you on this visit. You may need to get them from the follow-up care provider. You can also get less expensive kinds over the counter (without a prescription). Ask you pharmacist for details.

## 2014-06-22 LAB — ECG 12-LEAD
Atrial Rate: 68 {beats}/min
P Axis: 39 degrees
P-R Interval: 172 ms
Q-T Interval: 384 ms
QRS Duration: 84 ms
QTC Calculation (Bezet): 408 ms
R Axis: 56 degrees
T Axis: 20 degrees
Ventricular Rate: 68 {beats}/min

## 2014-06-23 ENCOUNTER — Telehealth (HOSPITAL_BASED_OUTPATIENT_CLINIC_OR_DEPARTMENT_OTHER): Payer: Medicaid Other

## 2014-06-23 ENCOUNTER — Emergency Department: Payer: Medicaid Other

## 2014-06-23 ENCOUNTER — Emergency Department
Admission: EM | Admit: 2014-06-23 | Discharge: 2014-06-23 | Disposition: A | Discharge status: Home / Self Care | Payer: Medicaid Other | Attending: Emergency Medicine | Admitting: Emergency Medicine

## 2014-06-23 DIAGNOSIS — F419 Anxiety disorder, unspecified: Secondary | ICD-10-CM

## 2014-06-23 DIAGNOSIS — O26891 Other specified pregnancy related conditions, first trimester: Secondary | ICD-10-CM | POA: Insufficient documentation

## 2014-06-23 DIAGNOSIS — F17210 Nicotine dependence, cigarettes, uncomplicated: Secondary | ICD-10-CM | POA: Insufficient documentation

## 2014-06-23 DIAGNOSIS — R45851 Suicidal ideations: Secondary | ICD-10-CM

## 2014-06-23 DIAGNOSIS — J45909 Unspecified asthma, uncomplicated: Secondary | ICD-10-CM | POA: Insufficient documentation

## 2014-06-23 DIAGNOSIS — Z3491 Encounter for supervision of normal pregnancy, unspecified, first trimester: Secondary | ICD-10-CM

## 2014-06-23 DIAGNOSIS — F410 Panic disorder [episodic paroxysmal anxiety] without agoraphobia: Secondary | ICD-10-CM | POA: Insufficient documentation

## 2014-06-23 DIAGNOSIS — Z3A00 Weeks of gestation of pregnancy not specified: Secondary | ICD-10-CM | POA: Insufficient documentation

## 2014-06-23 MED ORDER — ACETAMINOPHEN 500 MG PO TABS
1000.0000 mg | ORAL_TABLET | Freq: Once | ORAL | Status: AC
Start: 2014-06-23 — End: 2014-06-23
  Administered 2014-06-23: 1000 mg via ORAL
  Filled 2014-06-23: qty 2

## 2014-06-23 NOTE — ED Notes (Signed)
Charge nurse and assigned nurse made aware of pt triage condition, since pt not having active SI thoughts, no need to have security at bedside. Room window and door kept open for observation

## 2014-06-23 NOTE — ED Notes (Addendum)
See triage note. Correction with type- Pt states feeling unworthy of being pregnant and thought it would be best if she got hit by a car

## 2014-06-23 NOTE — Progress Notes (Signed)
Psychiatric Evaluation Part I    Beth Hays is a 29 y.o. female admitted to the [facility] Emergency Department who was seen via Telepsych on 06/23/2014 by Romie Levee, LCSW.    Call Details  Patient Location: [facility]  Patient Room Number: 10  Time contacted by ED Physician: 1600  Time consult began: 1603  Time (in minutes) from Call to Consult: 3  Time consult concluded: 1626  Referring ED Department  Emergency Department: [facility]        Discharge Planning  Living Arrangements: Alone  Support Systems: Family members  Type of Residence: Private residence  Patient expects to be discharged to:: home    Presenting Mental Status  Orientation Level: Oriented X4  Memory: No Impairment  Thought Content: normal  Thought Process: normal  Behavior: normal  Consciousness: Alert  Impulse Control: normal  Perception: normal  Eye Contact: normal  Attitude: cooperative  Mood: anxious  Hopelessness Affects Goals: No  Hopelessness About Future: No  Affect: normal  Speech: normal  Concentration: normal  Insight: good  Judgment: good  Appearance: normal  Appetite: decreased  Weight change?: normal  Energy: normal  Sleep: normal  Reliability of Reporter/Patient: good    Tool for Assessment of Suicide Risk  Individual Risk Profile: age 31-30  Symptom Risk Profile: anxiety, panic attacks  Protective Factors: employed, commitment to occupation, trait optimism  Level of Suicide Risk: Low  Monitoring/Suicide Alert Level: Routine monitoring    Within the Last 6 Months:: no history of violence toward self  Greater than 6 Months Ago:: no history of violence toward self                              Preliminary Diagnosis #1: unspecified anxiety disorder                Violence Toward Others  Within the Last 6 Months:: no history of violence toward others  Greater than 6 Months Ago:: no history of violence toward others         Summary: 29 y/o F who had anxiety attack following a phone call. Pt states she  recently found out she was pregnant and had trouble adjusting, as it was not a planned pregnancy. Pt reports her best friend is also pregnant. Pt states she contemplated having an abortion and made the comment to her friend that if she did, she would have trouble being around her friend constantly, since it would remind her of her own pregnancy. Pt has since decided to keep the pregnancy and states she is happy about it. Pt reports her friend told her mother (who is like an aunt to pt), and her friend's mom called her today and screamed at her and called her names for not being supportive. Pt states she was shaken up after the phone call and went outside, where she began to hyperventilate. Pt states she had a fleeting thought that she was going to jump in front of a car, which scared her and prompted her to call 911. Pt adamantly denies SI, states even when she had the though she had no intention of acting on it, and now feels much calmer. Pt reports she has limited support locally, but states her out-of-state family is very supportive. Pt is calm and cooperative in ED, requesting additional counseling services. Pt reports her insurance will switch to [place] next week (currently, she has Medicaid). Pt advised she can follow  up at [facility] this week and then contact [facility] for assistance in finding a therapist once her Mikael Spray is active.     Disposition: d/c home, [facility] follow up prn     Insurance Pre-authorization information: d/c      Romie Levee, LCSW    [facility]  [address]  [place], [place] [address]  [phone]

## 2014-06-23 NOTE — ED Provider Notes (Signed)
EMERGENCY DEPARTMENT HISTORY AND PHYSICAL        Physician/Midlevel provider first contact with patient: 06/23/14 1534       Date: 06/23/2014  Patient Name: Beth Hays  Chief Complaint:   Chief Complaint   Patient presents with   . Panic Attack       History of Presenting Illness           History Provided By: the patient.  Translation services  were not used.  Sign language services were not used.    HPI    Chief Complaint: panic attack  Onset: this afternoon  Timing: suddenly  Location: neurological  Severity: moderate  Modifying Factors: n/a  Associated Symptoms:  Suicidal thoughts        Chief Complaint   Panic Attack      Patient presents with fleeting suicidal thoughts/threats. Onset of symptoms was abrupt starting a few hours ago.  Symptoms are of mild severity and are completely resolved.  Patient states symptoms have been exacerbated by relationship problem with her aunt. Symptoms are associated with intent to harm self threats to jump in front of motor vehicle and threat to use motor vehicle for self harm. Patient expressed intent to harm self without definite plan. Care prior to arrival consisted of nothing, with moderate relief.         PCP: Pcp, Noneorunknown, MD    LMP: Patient's last menstrual period was 04/12/2014 (approximate).    GYN HX:   Obstetric History    G1   P0   T0   P0   A0   TAB0   SAB0   E0   M0   L0        Blood Type:        Past History     Past Medical History:  Past Medical History   Diagnosis Date   . Asthma without status asthmaticus    . Endometriosis    . Fibroids            Past Surgical History:  Past Surgical History   Procedure Laterality Date   . Ovarian cyst removal           Family History:  No family history on file.        Social History:  History   Substance Use Topics   . Smoking status: Light Tobacco Smoker -- 0.25 packs/day for 10 years     Types: Cigarettes   . Smokeless tobacco: Not on file   . Alcohol Use: No      Comment: not since she found out she is pregnant          Allergies:  Allergies   Allergen Reactions   . Peanuts [Peanut Oil]        No current facility-administered medications for this encounter.     Current Outpatient Prescriptions   Medication Sig Dispense Refill   . ALBUTEROL IN Inhale into the lungs.         Review of Systems     Review of Systems   Constitutional: Negative for fever, diaphoresis, activity change, appetite change and fatigue.   HENT: Negative for ear pain, rhinorrhea and sore throat.    Eyes: Negative for pain, discharge, redness and visual disturbance.   Respiratory: Positive for shortness of breath. Negative for apnea, cough, chest tightness, wheezing and stridor.    Cardiovascular: Negative for chest pain, palpitations and leg swelling.   Gastrointestinal: Positive for abdominal pain. Negative for nausea, vomiting,  diarrhea, blood in stool and abdominal distention.   Musculoskeletal: Negative for myalgias, arthralgias, neck pain and neck stiffness.        (+) Fall, right elbow pain, right rib pain.    Skin: Negative for color change, pallor and rash.   Neurological: Negative for dizziness, seizures, syncope, speech difficulty, weakness, numbness and headaches.   Hematological: Does not bruise/bleed easily.   Psychiatric/Behavioral: Positive for suicidal ideas.        Physical Exam     Medication List Reviewed: Yes      BP 116/79 mmHg  Pulse 90  Temp(Src) 98.3 F (36.8 C) (Oral)  Resp 19  SpO2 98%  LMP 04/12/2014 (Approximate)    Pulse Oximetry: 98%  (RA unless specified)      Physical Exam   Constitutional: She is oriented to person, place, and time. She appears well-developed and well-nourished. No distress.   HENT:   Head: Normocephalic and atraumatic.   Right Ear: External ear normal.   Left Ear: External ear normal.   Mouth/Throat: Oropharynx is clear and moist. No oropharyngeal exudate.   Eyes: Conjunctivae and EOM are normal. Pupils are equal, round, and reactive to light.   Neck: Normal range of motion. Neck supple. No JVD  present.   Cardiovascular: Normal rate, regular rhythm and normal heart sounds.    Pulmonary/Chest: Effort normal and breath sounds normal. No stridor. No respiratory distress. She has no wheezes. She has no rales. She exhibits no tenderness.   Abdominal: Soft. She exhibits no distension. There is no tenderness. There is no rebound and no guarding.   Musculoskeletal: She exhibits tenderness (minimally tender in RLQ, minimally tender in right elbow with flexion and extension).   Neurological: She is alert and oriented to person, place, and time. No cranial nerve deficit.   Skin: Skin is warm and dry. No rash noted. She is not diaphoretic. No erythema. No pallor.   Psychiatric: She has a normal mood and affect. Her behavior is normal. Judgment normal. She expresses suicidal ideation.   Nursing note and vitals reviewed.        Diagnostic Study Results     Labs -     Results     ** No results found for the last 24 hours. **              Radiologic Studies -   Radiology Results (24 Hour)     ** No results found for the last 24 hours. **          EKG/MONITOR    Current EKG: not performed    Old EKG:   not performed    Cardiac Monitor: not monitored      .      Medical Decision Making   I am the first provider for this patient.    I reviewed the vital signs, available nursing notes, past medical history, past surgical history, family history and social history.      Records Review: None       ED Course:     3:45 PM - Discussed with pt plan in ED including telepsych session. Pt agrees to hold off on X-ray and anxiety medicine due to pregnancy.     4:00 PM - Discussed with Huntley Dec (psych liason) who will conduct telepsych session.     4:05 PM - Pt currently speaking with psych liason.     4:40 PM - 6:22 PM  Pt is feeling better and would like to go home.  Discussed psych results with pt and counseled on diagnosis, f/u plans, medication use, and signs and symptoms when to return to ED.  Pt is stable and ready for discharge.         Provider Notes:       Procedures: none      Core Measures:  none      Critical Care Time: none          Diagnosis     Clinical Impression:  Primary diagnosis is in boldface  1. Panic attack    2. First trimester pregnancy    3. Fleeting Suicidal ideation, resolved.  NO CURRENT SUICIDAL IDEATION        DISPOSITION    discharged home     DISPOSITION CONDITION    stable    Vital Signs-.     Patient Vitals for the past 12 hrs:   BP Temp Pulse Resp   06/23/14 1557 116/79 mmHg 98.3 F (36.8 C) 90 19             _______________________________    Attestations:    This note is prepared by Rusty Aus, acting as Scribe for Gaylord Shih, MD    Gaylord Shih,  MD: The scribe's documentation has been prepared under my direction and personally reviewed by me in its entirety.  I confirm that the note above accurately reflects all work, treatment, procedures, and medical decision making performed by me.    _______________________________      CHART RECONCILIATION: CHART OWNERSHIP: Dr. Gaylord Shih is the primary emergency physician of record.              Harless Litten, MD  06/23/14 [phone]

## 2014-06-27 ENCOUNTER — Ambulatory Visit (INDEPENDENT_AMBULATORY_CARE_PROVIDER_SITE_OTHER): Payer: Medicaid Other | Admitting: Family

## 2014-06-27 VITALS — BP 121/59 | HR 82 | Temp 98.4°F | Resp 18 | Ht 66.0 in | Wt 157.0 lb

## 2014-06-27 DIAGNOSIS — F329 Major depressive disorder, single episode, unspecified: Secondary | ICD-10-CM

## 2014-06-27 DIAGNOSIS — F32A Depression, unspecified: Secondary | ICD-10-CM

## 2014-06-27 MED ORDER — SERTRALINE HCL 25 MG PO TABS
ORAL_TABLET | ORAL | Status: AC
Start: 2014-06-27 — End: ?

## 2014-06-27 NOTE — Patient Instructions (Signed)
[facility] PATIENT INSTRUCTIONS     Return to [facility] anytime if necessary.  Walk-in hours are Monday thru Saturday, Noon to 8pm, excluding major holidays.  We close at 4pm on the first Wednesday of every month.   Follow-up with outpatient psychiatrist. In 1-3 weeks return to [facility] to monitor progress.    Abstain from alcohol and/or illegal drugs as they interfere with psychiatric medications    Take all medications as prescribed. Do not take extra medications or stop any medications without first talking to and getting instructions to do so from your outpatient providers.    Immediately go to the nearest emergency department or call 911 if you have any thoughts of wanting to harm yourself or others, or for any other crisis.   Consult with your pharmacist if you questions about your medications, their side effects or possible interactions with other medications you take.          [place]  [facility] RES[facility]    IPAC Call Center (For admissions and screening for all [facility] Mental Health Services and Addiction Treatment Services CATS, Including PHP Partial Hospitalization Program, Inpatient Detox, IOP Intensive Outpatient Programs, Outpatient psychiatry, Outpatient counseling): [phone]      [facility]  [facility]  [address]  [place], [place] [address] For Urgent Adult (18 and Over) Psychiatric Assessments [phone]     [facility]  [address]  [place], [place] [address]   For Child and Youth (Under 18) Mental Health and Substance Abuse Outpatient Services [phone]   [facility]  [address]  [place], [place] [address]   For Non-Urgent Psychiatric Appointments: [phone]   [facility]  [address] [place], [place] [address]   For Non-Urgent Psychiatric Appointments: [phone]   [facility] [address]  [address]  [place], [place] [address]   For Non-Urgent Psychiatric Appointments: [phone]   [facility] [place] [facility]  [address]   [place], [place] [address]   For Non-Urgent Psychiatric Appointments: [phone]         COMMUNITY RESOURCES (MENTAL HEALTH CENTERS):      [facility]  Entry and Referral Services [phone]     [facility], [place], [place] [phone]   [facility], [place], [place] [phone]     [place], [place], [place] [phone]     [facility], [place], [place] [phone]     [facility], [place], [place]  [phone]       [facility], [place], [place] [phone]     [facility], [place], [place]  [phone]       [facility]  [phone]) [phone]              [facility] [phone]       Sertraline Hydrochloride Oral tablet  What is this medicine?  SERTRALINE (SER tra leen) is used to treat depression. It may also be used to treat obsessive compulsive disorder, panic disorder, post-trauma stress, premenstrual dysphoric disorder (PMDD) or social anxiety.  This medicine may be used for other purposes; ask your  health care provider or pharmacist if you have questions.  What should I tell my health care provider before I take this medicine?  They need to know if you have any of these conditions:   bipolar disorder or a family history of bipolar disorder   diabetes   glaucoma   heart disease   high blood pressure   history of irregular heartbeat   history of low levels of calcium, magnesium, or potassium in the blood   if you often drink alcohol   liver disease   receiving electroconvulsive therapy   seizures   suicidal  thoughts, plans, or attempt; a previous suicide attempt by you or a family member   thyroid disease   an unusual or allergic reaction to sertraline, other medicines, foods, dyes, or preservatives   pregnant or trying to get pregnant   breast-feeding  How should I use this medicine?  Take this medicine by mouth with a glass of water. Follow the directions on the prescription label. You can take it with or without food. Take your medicine at regular intervals. Do not take your medicine more often than directed. Do not stop taking this medicine suddenly except upon the advice of your doctor. Stopping this medicine too quickly may cause serious side effects or your condition may worsen.  A special MedGuide will be given to you by the pharmacist with each prescription and refill. Be sure to read this information carefully each time.  Talk to your pediatrician regarding the use of this medicine in children. While this drug may be prescribed for children as young as 7 years for selected conditions, precautions do apply.  Overdosage: If you think you have taken too much of this medicine contact a poison control center or emergency room at once.  NOTE: This medicine is only for you. Do not share this medicine with others.  What if I miss a dose?  If you miss a dose, take it as soon as you can. If it is almost time for your next dose, take only that dose. Do not take double or extra doses.  What may interact with this medicine?  Do not take this medicine with any of the following medications:   certain medicines for fungal infections like fluconazole, itraconazole, ketoconazole, posaconazole, voriconazole   cisapride   disulfiram   dofetilide   linezolid   MAOIs like Carbex, Eldepryl, Marplan, Nardil, and Parnate   metronidazole   methylene blue (injected into a vein)   pimozide   thioridazine   ziprasidone  This medicine may also interact with the following medications:   alcohol   aspirin and aspirin-like  medicines   certain medicines for depression, anxiety, or psychotic disturbances   certain medicines for irregular heart beat like flecainide, propafenone   certain medicines for migraine headaches like almotriptan, eletriptan, frovatriptan, naratriptan, rizatriptan, sumatriptan, zolmitriptan   certain medicines for sleep   certain medicines for seizures like carbamazepine, valproic acid, phenytoin   certain medicines that treat or prevent blood clots like warfarin, enoxaparin, dalteparin   cimetidine   digoxin   diuretics   fentanyl   furazolidone   isoniazid   lithium   NSAIDs, medicines for pain and inflammation, like ibuprofen or naproxen   other medicines that prolong the QT interval (cause an abnormal heart rhythm)   procarbazine   rasagiline   supplements like St. John's wort, kava kava, valerian   tolbutamide   tramadol   tryptophan  This list may not describe  all possible interactions. Give your health care provider a list of all the medicines, herbs, non-prescription drugs, or dietary supplements you use. Also tell them if you smoke, drink alcohol, or use illegal drugs. Some items may interact with your medicine.  What should I watch for while using this medicine?  Tell your doctor if your symptoms do not get better or if they get worse. Visit your doctor or health care professional for regular checks on your progress. Because it may take several weeks to see the full effects of this medicine, it is important to continue your treatment as prescribed by your doctor.  Patients and their families should watch out for new or worsening thoughts of suicide or depression. Also watch out for sudden changes in feelings such as feeling anxious, agitated, panicky, irritable, hostile, aggressive, impulsive, severely restless, overly excited and hyperactive, or not being able to sleep. If this happens, especially at the beginning of treatment or after a change in dose, call your health care  professional.  Bonita Quin may get drowsy or dizzy. Do not drive, use machinery, or do anything that needs mental alertness until you know how this medicine affects you. Do not stand or sit up quickly, especially if you are an older patient. This reduces the risk of dizzy or fainting spells. Alcohol may interfere with the effect of this medicine. Avoid alcoholic drinks.  Your mouth may get dry. Chewing sugarless gum or sucking hard candy, and drinking plenty of water may help. Contact your doctor if the problem does not go away or is severe.  What side effects may I notice from receiving this medicine?  Side effects that you should report to your doctor or health care professional as soon as possible:   allergic reactions like skin rash, itching or hives, swelling of the face, lips, or tongue   black or bloody stools, blood in the urine or vomit   fast, irregular heartbeat   feeling faint or lightheaded, falls   hallucination, loss of contact with reality   seizures   suicidal thoughts or other mood changes   unusual bleeding or bruising   unusually weak or tired   vomiting  Side effects that usually do not require medical attention (report to your doctor or health care professional if they continue or are bothersome):   change in appetite   change in sex drive or performance   diarrhea   increased sweating   indigestion, nausea   tremors  This list may not describe all possible side effects. Call your doctor for medical advice about side effects. You may report side effects to FDA at 1-800-FDA-1088.  Where should I keep my medicine?  Keep out of the reach of children.  Store at room temperature between 15 and 30 degrees C (59 and 86 degrees F). Throw away any unused medicine after the expiration date.  NOTE:This sheet is a summary. It may not cover all possible information. If you have questions about this medicine, talk to your doctor, pharmacist, or health care provider. Copyright 2015 [employer]
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
## 2014-06-27 NOTE — Progress Notes (Signed)
[facility]) Walk-In Evaluation    Date/Time:  06/27/2014  1:43 PM  Patient Name: Beth Hays, Beth Hays  MRN:  [id number]  Age: 29 y.o.  DOB: 07-Aug-1985    PART-1  TRIAGE     Vital Signs:     Filed Vitals:    06/27/14 1343   BP: 121/59   Pulse: 82   Temp: 98.4 F (36.9 C)   Resp: 18   SpO2: 100%       Presenting problem/Expectations for today's visit ( brief)   CC:   "I was having suicidal thoughts this week, and it scared me. The hospital said I should come talk to somebody"    The patient is a 29 year old, domiciled, single, African American, female who presented at the [facility]) due to c/o recent anxiety episode leading to fleeting suicidal ideation. The pt states that she had a recent ED visit following an verbal confrontation with a family member triggering the pt to feel impulsively suicidal.  The pt further reports that this was her episode of suicidal ideations, and had no plan or intent. The pt endorsed psychosocial stressors related to unplanned pregnancy, and problems with primary support.  Pt seeking therapy over medication, but open to evaluation for medication.    Screening questions:   Current or recent Suicidal ideation/intent or plan: Yes, describe fleeting thought on 06/23/14 to walk in front of a car. Denies current SI  Current or recent Homicidal ideation/intent or plan:No    Any drugs: Yes, describe marijuana, last use 2 days ago  Alcohol: No, last use 1 month ago   Any Withdrawal symptoms:?    Hallucinations: No      Pt will be seen next by [facility] physician    Esvin Hnat Adair Patter

## 2014-07-01 LAB — OB RESULTS CONSOLE HGB/HCT, BLOOD
HCT: 31 %
Hemoglobin: 10.8 g/dL

## 2014-07-01 LAB — OB RESULTS CONSOLE PLATELET COUNT: PLATELETS: 201 10*3/uL

## 2014-07-01 LAB — OB RESULTS CONSOLE VARICELLA ZOSTER ANTIBODY, IGG: VARICELLA IGG: IMMUNE

## 2014-07-01 LAB — OB RESULTS CONSOLE ABO/RH: RH TYPE: POSITIVE

## 2014-07-01 LAB — OB RESULTS CONSOLE ANTIBODY SCREEN: Antibody Screen: NEGATIVE

## 2014-08-01 LAB — OB RESULTS CONSOLE GC/CHLAMYDIA
CHLAMYDIA, DNA PROBE: NEGATIVE
GC PROBE AMP, GENITAL: NEGATIVE

## 2014-09-01 ENCOUNTER — Encounter (INDEPENDENT_AMBULATORY_CARE_PROVIDER_SITE_OTHER): Payer: Medicaid Other | Admitting: Psychiatry

## 2014-10-21 LAB — OB RESULTS CONSOLE RPR: RPR: NONREACTIVE

## 2014-10-21 LAB — FREE THYROXINE INDEX: Glucose, GTT - 1 Hour: 124 mg/dL (ref ?–200)

## 2014-10-29 ENCOUNTER — Encounter: Payer: Self-pay | Admitting: *Deleted

## 2014-11-08 ENCOUNTER — Encounter (HOSPITAL_COMMUNITY): Payer: Self-pay | Admitting: *Deleted

## 2014-11-08 ENCOUNTER — Inpatient Hospital Stay (HOSPITAL_COMMUNITY)
Admission: AD | Admit: 2014-11-08 | Discharge: 2014-11-08 | Disposition: A | Discharge status: Home / Self Care | Payer: Medicaid - Out of State | Source: Ambulatory Visit | Attending: Obstetrics & Gynecology | Admitting: Obstetrics & Gynecology

## 2014-11-08 DIAGNOSIS — O9989 Other specified diseases and conditions complicating pregnancy, childbirth and the puerperium: Secondary | ICD-10-CM | POA: Insufficient documentation

## 2014-11-08 DIAGNOSIS — Z3A31 31 weeks gestation of pregnancy: Secondary | ICD-10-CM | POA: Insufficient documentation

## 2014-11-08 DIAGNOSIS — N898 Other specified noninflammatory disorders of vagina: Secondary | ICD-10-CM | POA: Insufficient documentation

## 2014-11-08 DIAGNOSIS — Z87891 Personal history of nicotine dependence: Secondary | ICD-10-CM | POA: Insufficient documentation

## 2014-11-08 DIAGNOSIS — A599 Trichomoniasis, unspecified: Secondary | ICD-10-CM

## 2014-11-08 DIAGNOSIS — R1030 Lower abdominal pain, unspecified: Secondary | ICD-10-CM | POA: Diagnosis present

## 2014-11-08 LAB — URINALYSIS, ROUTINE W REFLEX MICROSCOPIC
Bilirubin Urine: NEGATIVE
GLUCOSE, UA: NEGATIVE mg/dL
HGB URINE DIPSTICK: NEGATIVE
Ketones, ur: NEGATIVE mg/dL
Nitrite: NEGATIVE
Protein, ur: NEGATIVE mg/dL
SPECIFIC GRAVITY, URINE: 1.02 (ref 1.005–1.030)
UROBILINOGEN UA: 0.2 mg/dL (ref 0.0–1.0)
pH: 6 (ref 5.0–8.0)

## 2014-11-08 LAB — URINE MICROSCOPIC-ADD ON

## 2014-11-08 LAB — WET PREP, GENITAL
Clue Cells Wet Prep HPF POC: NONE SEEN
YEAST WET PREP: NONE SEEN

## 2014-11-08 MED ORDER — METRONIDAZOLE 500 MG PO TABS
2000.0000 mg | ORAL_TABLET | Freq: Once | ORAL | Status: AC
  Administered 2014-11-08: 2000 mg via ORAL
  Filled 2014-11-08: qty 4

## 2014-11-08 NOTE — Discharge Instructions (Signed)

## 2014-11-08 NOTE — MAU Note (Signed)
Pt presents to MAU with complaints of pain in her lower abdomen and lower back that started yesterday. Denis any vaginal bleeding

## 2014-11-08 NOTE — MAU Provider Note (Signed)
History   CSN: [id number]  Arrival date and time: 11/08/14 1409   First Provider Initiated Contact with Patient 11/08/14 1450     Chief Complaint  Patient presents with  . Abdominal Pain    HPI Patient is 29 y.o. G1P0 [redacted]w[redacted]d here with complaints of lower abdomen pain and low back pain. States pain started last night. Says that it comes and goes and is more of a pressure. She has not had this feeling before. She endorses some vaginal discharge that is white in color and thick. States that it does cause itching of her vulva. She denies any recent trauma to abdomen or intercourse (last time she had intercourse was around time of conception).  +FM, denies LOF, VB, contractions.   OB History    Gravida Para Term Preterm AB TAB SAB Ectopic Multiple Living   1             -Patient recently moved to area from [place].  -Waiting to establish with new clinic  Past Medical History  Diagnosis Date  . Asthma   . Ovarian cyst     Past Surgical History  Procedure Laterality Date  . Ovarian cyst surgery      History reviewed. No pertinent family history.  History  Substance Use Topics  . Smoking status: Former Smoker -- 0.50 packs/day    Types: Cigarettes  . Smokeless tobacco: Not on file  . Alcohol Use: No    Allergies:  Allergies  Allergen Reactions  . Peanut-Containing Drug Products Anaphylaxis and Hives  . Peanuts [Peanut Oil] Anaphylaxis and Hives    Prescriptions prior to admission  Medication Sig Dispense Refill Last Dose  . acetaminophen (TYLENOL) 500 MG tablet Take 500-1,000 mg by mouth every 4 (four) hours as needed for mild pain or headache. Menstrual pain   PRN at PRN  . albuterol (PROVENTIL HFA;VENTOLIN HFA) 108 (90 BASE) MCG/ACT inhaler Inhale 2 puffs into the lungs every 4 (four) hours as needed for wheezing or shortness of breath.    PRN at PRN   . calcium carbonate (TUMS - DOSED IN MG ELEMENTAL CALCIUM) 500 MG chewable tablet Chew 1 tablet by mouth daily as  needed for indigestion or heartburn.   Past Week at Unknown time  . EPINEPHrine (EPIPEN) 0.3 mg/0.3 mL DEVI Inject 0.3 mg into the muscle once as needed (for allergic reaction).    PRN at PRN  . Prenatal Vit-Fe Fumarate-FA (PRENATAL MULTIVITAMIN) TABS tablet Take 1 tablet by mouth daily.   11/07/2014 at Unknown time  . traMADol (ULTRAM) 50 MG tablet Take 1 tablet (50 mg total) by mouth every 6 (six) hours as needed for pain. (Patient not taking: Reported on 11/08/2014) 10 tablet 0     Review of Systems  Constitutional: Negative for fever and chills.  Eyes: Negative for blurred vision and double vision.  Respiratory: Negative for shortness of breath.   Cardiovascular: Negative for chest pain and leg swelling.  Gastrointestinal: Positive for abdominal pain. Negative for nausea, vomiting and constipation.  Genitourinary: Negative for dysuria.  Neurological: Negative for dizziness and headaches.  Also per HPI  Physical Exam   Blood pressure 105/67, pulse 82, temperature 97.6 F (36.4 C), temperature source Oral, resp. rate 18, height 5\' 5"  (1.651 m), weight 168 lb (76.204 kg), last menstrual period 04/12/2014.  Physical Exam  Constitutional: She is oriented to person, place, and time. She appears well-developed and well-nourished. No distress.  HENT:  Head: Normocephalic and atraumatic.  Eyes: EOM are  normal.  Cardiovascular: Normal rate, regular rhythm, normal heart sounds and intact distal pulses.   Respiratory: Effort normal and breath sounds normal.  GI: Soft. Bowel sounds are normal. There is no tenderness.  Genitourinary: Vaginal discharge found.  Musculoskeletal: Normal range of motion. She exhibits no edema or tenderness.  Neurological: She is alert and oriented to person, place, and time.  Skin: Skin is warm and dry.    MAU Course  Procedures - None  MDM: UA unremarkable; no signs of UTI Speculum exam noted to have moderate amount of vaginal discharge SVE with closed thick  and high cervix Gc/Ch collected and pending Wet prep positivie for Trich > S/p 2g Flagyl dose  Assessment and Plan  A: Patient is 29 y.o. G1P0 [redacted]w[redacted]d reporting abdominal pain and discharge likely secondary to infection with Trich.   P: Discharge home - Reviewed findings and my conclusion - Encouraged pregnancy support belt - fetal kick counts reinforced - preterm labor precautions dicussed - discussed with patient the need for partners to be treated (denies any recent sexual activity) - Handout given - Follow-up with OB provider   Luiz Blare, DO 11/08/2014, 3:59 PM PGY-1, [place]  I spoke with and examined patient and agree with resident/PA/SNM's note and plan of care.  Cat I FHR/Reactive NST, no uc's Has 1st appt at [facility] this week (just recently moved from [place])- had regular pnc there-uncomplicated pregnancy Roma Schanz, CNM, [facility] 11/08/2014 8:17 PM

## 2014-11-10 LAB — GC/CHLAMYDIA PROBE AMP (~~LOC~~) NOT AT ARMC
Chlamydia: NEGATIVE
NEISSERIA GONORRHEA: NEGATIVE

## 2014-11-11 ENCOUNTER — Encounter: Payer: Self-pay | Admitting: Certified Nurse Midwife

## 2014-11-11 ENCOUNTER — Ambulatory Visit (INDEPENDENT_AMBULATORY_CARE_PROVIDER_SITE_OTHER): Payer: Medicaid Other | Admitting: Obstetrics and Gynecology

## 2014-11-11 VITALS — BP 122/67 | HR 76 | Wt 168.2 lb

## 2014-11-11 DIAGNOSIS — Z3493 Encounter for supervision of normal pregnancy, unspecified, third trimester: Secondary | ICD-10-CM

## 2014-11-11 DIAGNOSIS — Z3483 Encounter for supervision of other normal pregnancy, third trimester: Secondary | ICD-10-CM

## 2014-11-11 DIAGNOSIS — A749 Chlamydial infection, unspecified: Secondary | ICD-10-CM

## 2014-11-11 DIAGNOSIS — Z23 Encounter for immunization: Secondary | ICD-10-CM | POA: Diagnosis not present

## 2014-11-11 DIAGNOSIS — F1290 Cannabis use, unspecified, uncomplicated: Secondary | ICD-10-CM

## 2014-11-11 DIAGNOSIS — O98319 Other infections with a predominantly sexual mode of transmission complicating pregnancy, unspecified trimester: Secondary | ICD-10-CM

## 2014-11-11 DIAGNOSIS — F1210 Cannabis abuse, uncomplicated: Secondary | ICD-10-CM

## 2014-11-11 DIAGNOSIS — O98819 Other maternal infectious and parasitic diseases complicating pregnancy, unspecified trimester: Secondary | ICD-10-CM

## 2014-11-11 DIAGNOSIS — A599 Trichomoniasis, unspecified: Secondary | ICD-10-CM

## 2014-11-11 LAB — CBC
HCT: 30 % — ABNORMAL LOW (ref 36.0–46.0)
Hemoglobin: 10.4 g/dL — ABNORMAL LOW (ref 12.0–15.0)
MCH: 32.6 pg (ref 26.0–34.0)
MCHC: 34.7 g/dL (ref 30.0–36.0)
MCV: 94 fL (ref 78.0–100.0)
MPV: 10.2 fL (ref 8.6–12.4)
PLATELETS: 200 10*3/uL (ref 150–400)
RBC: 3.19 MIL/uL — ABNORMAL LOW (ref 3.87–5.11)
RDW: 14.2 % (ref 11.5–15.5)
WBC: 8.1 10*3/uL (ref 4.0–10.5)

## 2014-11-11 LAB — POCT URINALYSIS DIP (DEVICE)
Bilirubin Urine: NEGATIVE
Glucose, UA: NEGATIVE mg/dL
Hgb urine dipstick: NEGATIVE
Ketones, ur: NEGATIVE mg/dL
Nitrite: NEGATIVE
Protein, ur: NEGATIVE mg/dL
Specific Gravity, Urine: 1.015 (ref 1.005–1.030)
Urobilinogen, UA: 0.2 mg/dL (ref 0.0–1.0)
pH: 6.5 (ref 5.0–8.0)

## 2014-11-11 MED ORDER — TETANUS-DIPHTH-ACELL PERTUSSIS 5-2.5-18.5 LF-MCG/0.5 IM SUSP
0.5000 mL | Freq: Once | INTRAMUSCULAR | Status: AC
  Administered 2014-11-11: 0.5 mL via INTRAMUSCULAR

## 2014-11-11 NOTE — Patient Instructions (Signed)
Third Trimester of Pregnancy The third trimester is from week 29 through week 42, months 7 through 9. The third trimester is a time when the fetus is growing rapidly. At the end of the ninth month, the fetus is about 20 inches in length and weighs 6-10 pounds.  BODY CHANGES Your body goes through many changes during pregnancy. The changes vary from woman to woman.   Your weight will continue to increase. You can expect to gain 25-35 pounds (11-16 kg) by the end of the pregnancy.  You may begin to get stretch marks on your hips, abdomen, and breasts.  You may urinate more often because the fetus is moving lower into your pelvis and pressing on your bladder.  You may develop or continue to have heartburn as a result of your pregnancy.  You may develop constipation because certain hormones are causing the muscles that push waste through your intestines to slow down.  You may develop hemorrhoids or swollen, bulging veins (varicose veins).  You may have pelvic pain because of the weight gain and pregnancy hormones relaxing your joints between the bones in your pelvis. Backaches may result from overexertion of the muscles supporting your posture.  You may have changes in your hair. These can include thickening of your hair, rapid growth, and changes in texture. Some women also have hair loss during or after pregnancy, or hair that feels dry or thin. Your hair will most likely return to normal after your baby is born.  Your breasts will continue to grow and be tender. A yellow discharge may leak from your breasts called colostrum.  Your belly button may stick out.  You may feel short of breath because of your expanding uterus.  You may notice the fetus "dropping," or moving lower in your abdomen.  You may have a bloody mucus discharge. This usually occurs a few days to a week before labor begins.  Your cervix becomes thin and soft (effaced) near your due date. WHAT TO EXPECT AT YOUR PRENATAL  EXAMS  You will have prenatal exams every 2 weeks until week 36. Then, you will have weekly prenatal exams. During a routine prenatal visit:  You will be weighed to make sure you and the fetus are growing normally.  Your blood pressure is taken.  Your abdomen will be measured to track your baby's growth.  The fetal heartbeat will be listened to.  Any test results from the previous visit will be discussed.  You may have a cervical check near your due date to see if you have effaced. At around 36 weeks, your caregiver will check your cervix. At the same time, your caregiver will also perform a test on the secretions of the vaginal tissue. This test is to determine if a type of bacteria, Group B streptococcus, is present. Your caregiver will explain this further. Your caregiver may ask you:  What your birth plan is.  How you are feeling.  If you are feeling the baby move.  If you have had any abnormal symptoms, such as leaking fluid, bleeding, severe headaches, or abdominal cramping.  If you have any questions. Other tests or screenings that may be performed during your third trimester include:  Blood tests that check for low iron levels (anemia).  Fetal testing to check the health, activity level, and growth of the fetus. Testing is done if you have certain medical conditions or if there are problems during the pregnancy. FALSE LABOR You may feel small, irregular contractions that   eventually go away. These are called Braxton Hicks contractions, or false labor. Contractions may last for hours, days, or even weeks before true labor sets in. If contractions come at regular intervals, intensify, or become painful, it is best to be seen by your caregiver.  SIGNS OF LABOR   Menstrual-like cramps.  Contractions that are 5 minutes apart or less.  Contractions that start on the top of the uterus and spread down to the lower abdomen and back.  A sense of increased pelvic pressure or back  pain.  A watery or bloody mucus discharge that comes from the vagina. If you have any of these signs before the 37th week of pregnancy, call your caregiver right away. You need to go to the hospital to get checked immediately. HOME CARE INSTRUCTIONS   Avoid all smoking, herbs, alcohol, and unprescribed drugs. These chemicals affect the formation and growth of the baby.  Follow your caregiver's instructions regarding medicine use. There are medicines that are either safe or unsafe to take during pregnancy.  Exercise only as directed by your caregiver. Experiencing uterine cramps is a good sign to stop exercising.  Continue to eat regular, healthy meals.  Wear a good support bra for breast tenderness.  Do not use hot tubs, steam rooms, or saunas.  Wear your seat belt at all times when driving.  Avoid raw meat, uncooked cheese, cat litter boxes, and soil used by cats. These carry germs that can cause birth defects in the baby.  Take your prenatal vitamins.  Try taking a stool softener (if your caregiver approves) if you develop constipation. Eat more high-fiber foods, such as fresh vegetables or fruit and whole grains. Drink plenty of fluids to keep your urine clear or pale yellow.  Take warm sitz baths to soothe any pain or discomfort caused by hemorrhoids. Use hemorrhoid cream if your caregiver approves.  If you develop varicose veins, wear support hose. Elevate your feet for 15 minutes, 3-4 times a day. Limit salt in your diet.  Avoid heavy lifting, wear low heal shoes, and practice good posture.  Rest a lot with your legs elevated if you have leg cramps or low back pain.  Visit your dentist if you have not gone during your pregnancy. Use a soft toothbrush to brush your teeth and be gentle when you floss.  A sexual relationship may be continued unless your caregiver directs you otherwise.  Do not travel far distances unless it is absolutely necessary and only with the approval  of your caregiver.  Take prenatal classes to understand, practice, and ask questions about the labor and delivery.  Make a trial run to the hospital.  Pack your hospital bag.  Prepare the baby's nursery.  Continue to go to all your prenatal visits as directed by your caregiver. SEEK MEDICAL CARE IF:  You are unsure if you are in labor or if your water has broken.  You have dizziness.  You have mild pelvic cramps, pelvic pressure, or nagging pain in your abdominal area.  You have persistent nausea, vomiting, or diarrhea.  You have a bad smelling vaginal discharge.  You have pain with urination. SEEK IMMEDIATE MEDICAL CARE IF:   You have a fever.  You are leaking fluid from your vagina.  You have spotting or bleeding from your vagina.  You have severe abdominal cramping or pain.  You have rapid weight loss or gain.  You have shortness of breath with chest pain.  You notice sudden or extreme swelling   of your face, hands, ankles, feet, or legs.  You have not felt your baby move in over an hour.  You have severe headaches that do not go away with medicine.  You have vision changes. Document Released: 05/03/2001 Document Revised: 05/14/2013 Document Reviewed: 07/10/2012 [facility] Patient Information 2015 [place], [place]. This information is not intended to replace advice given to you by your health care provider. Make sure you discuss any questions you have with your health care provider. Contraception Choices Contraception (birth control) is the use of any methods or devices to prevent pregnancy. Below are some methods to help avoid pregnancy. HORMONAL METHODS   Contraceptive implant. This is a thin, plastic tube containing progesterone hormone. It does not contain estrogen hormone. Your health care provider inserts the tube in the inner part of the upper arm. The tube can remain in place for up to 3 years. After 3 years, the implant must be removed. The implant prevents  the ovaries from releasing an egg (ovulation), thickens the cervical mucus to prevent sperm from entering the uterus, and thins the lining of the inside of the uterus.  Progesterone-only injections. These injections are given every 3 months by your health care provider to prevent pregnancy. This synthetic progesterone hormone stops the ovaries from releasing eggs. It also thickens cervical mucus and changes the uterine lining. This makes it harder for sperm to survive in the uterus.  Birth control pills. These pills contain estrogen and progesterone hormone. They work by preventing the ovaries from releasing eggs (ovulation). They also cause the cervical mucus to thicken, preventing the sperm from entering the uterus. Birth control pills are prescribed by a health care provider.Birth control pills can also be used to treat heavy periods.  Minipill. This type of birth control pill contains only the progesterone hormone. They are taken every day of each month and must be prescribed by your health care provider.  Birth control patch. The patch contains hormones similar to those in birth control pills. It must be changed once a week and is prescribed by a health care provider.  Vaginal ring. The ring contains hormones similar to those in birth control pills. It is left in the vagina for 3 weeks, removed for 1 week, and then a new one is put back in place. The patient must be comfortable inserting and removing the ring from the vagina.A health care provider's prescription is necessary.  Emergency contraception. Emergency contraceptives prevent pregnancy after unprotected sexual intercourse. This pill can be taken right after sex or up to 5 days after unprotected sex. It is most effective the sooner you take the pills after having sexual intercourse. Most emergency contraceptive pills are available without a prescription. Check with your pharmacist. Do not use emergency contraception as your only form of  birth control. BARRIER METHODS   Female condom. This is a thin sheath (latex or rubber) that is worn over the penis during sexual intercourse. It can be used with spermicide to increase effectiveness.  Female condom. This is a soft, loose-fitting sheath that is put into the vagina before sexual intercourse.  Diaphragm. This is a soft, latex, dome-shaped barrier that must be fitted by a health care provider. It is inserted into the vagina, along with a spermicidal jelly. It is inserted before intercourse. The diaphragm should be left in the vagina for 6 to 8 hours after intercourse.  Cervical cap. This is a round, soft, latex or plastic cup that fits over the cervix and must be fitted  by a health care provider. The cap can be left in place for up to 48 hours after intercourse.  Sponge. This is a soft, circular piece of polyurethane foam. The sponge has spermicide in it. It is inserted into the vagina after wetting it and before sexual intercourse.  Spermicides. These are chemicals that kill or block sperm from entering the cervix and uterus. They come in the form of creams, jellies, suppositories, foam, or tablets. They do not require a prescription. They are inserted into the vagina with an applicator before having sexual intercourse. The process must be repeated every time you have sexual intercourse. INTRAUTERINE CONTRACEPTION  Intrauterine device (IUD). This is a T-shaped device that is put in a woman's uterus during a menstrual period to prevent pregnancy. There are 2 types:  Copper IUD. This type of IUD is wrapped in copper wire and is placed inside the uterus. Copper makes the uterus and fallopian tubes produce a fluid that kills sperm. It can stay in place for 10 years.  Hormone IUD. This type of IUD contains the hormone progestin (synthetic progesterone). The hormone thickens the cervical mucus and prevents sperm from entering the uterus, and it also thins the uterine lining to prevent  implantation of a fertilized egg. The hormone can weaken or kill the sperm that get into the uterus. It can stay in place for 3-5 years, depending on which type of IUD is used. PERMANENT METHODS OF CONTRACEPTION  Female tubal ligation. This is when the woman's fallopian tubes are surgically sealed, tied, or blocked to prevent the egg from traveling to the uterus.  Hysteroscopic sterilization. This involves placing a small coil or insert into each fallopian tube. Your doctor uses a technique called hysteroscopy to do the procedure. The device causes scar tissue to form. This results in permanent blockage of the fallopian tubes, so the sperm cannot fertilize the egg. It takes about 3 months after the procedure for the tubes to become blocked. You must use another form of birth control for these 3 months.  Female sterilization. This is when the female has the tubes that carry sperm tied off (vasectomy).This blocks sperm from entering the vagina during sexual intercourse. After the procedure, the man can still ejaculate fluid (semen). NATURAL PLANNING METHODS  Natural family planning. This is not having sexual intercourse or using a barrier method (condom, diaphragm, cervical cap) on days the woman could become pregnant.  Calendar method. This is keeping track of the length of each menstrual cycle and identifying when you are fertile.  Ovulation method. This is avoiding sexual intercourse during ovulation.  Symptothermal method. This is avoiding sexual intercourse during ovulation, using a thermometer and ovulation symptoms.  Post-ovulation method. This is timing sexual intercourse after you have ovulated. Regardless of which type or method of contraception you choose, it is important that you use condoms to protect against the transmission of sexually transmitted infections (STIs). Talk with your health care provider about which form of contraception is most appropriate for you. Document Released:  05/09/2005 Document Revised: 05/14/2013 Document Reviewed: 11/01/2012 [facility] Patient Information 2015 [place], [place]. This information is not intended to replace advice given to you by your health care provider. Make sure you discuss any questions you have with your health care provider.

## 2014-11-11 NOTE — Progress Notes (Signed)
Breastfeeding tip of the week reviewed Initial prenatal visit 28wk labs Tdap vaccine New OB/28 wk educational material given

## 2014-11-11 NOTE — Progress Notes (Signed)
Subjective:  Ariana Young is a 29 y.o. G1P0 at [redacted]w[redacted]d being seen today for prenatal care.  She is transferring care from [place] to be near her mother. Plans to get work as Producer, television/film/video. PN records reviewed,; anatomy US normal per pt but report not yet located. Patient reports no complaints. Quit tobacco and marijuana. Some interest in waterbirth. Seen in [facility] 3 d ago for abd pain> cx normal, trich dx and treated with Flagyl.  Contractions: Not present.  Vag. Bleeding: None. Movement: Present. Denies leaking of fluid.   The following portions of the patient's history were reviewed and updated as appropriate: allergies, current medications, past family history, past medical history, past social history, past surgical history and problem list. Hx partial oophorectomy for dermoid cyst. Hx asthma, denies flares or regular use of meds.   Objective:   Filed Vitals:   11/11/14 0803  BP: 122/67  Pulse: 76  Weight: 168 lb 3.2 oz (76.295 kg)    Fetal Status: Fetal Heart Rate (bpm): 145   Movement: Present     General:  Alert, oriented and cooperative. Patient is in no acute distress.  Skin: Skin is warm and dry. No rash noted.   Cardiovascular: Normal heart rate noted  Respiratory: Effort and breath sounds normal, no problems with respiration noted  Abdomen: Soft, gravid, appropriate for gestational age. Pain/Pressure: Present     Vaginal: Vag. Bleeding: None.       Cervix: Not evaluated       Extremities: Normal range of motion.  Edema: Trace  Mental Status: Normal mood and affect. Normal behavior. Normal judgment and thought content.   Urinalysis:      Assessment and Plan:  Pregnancy: G1P0 at [redacted]w[redacted]d  1. Prenatal care in third trimester UDS sent. ROI for [place] report if not found. - CBC - HIV antibody (with reflex)  2. Need for Tdap vaccination  - Tdap (BOOSTRIX) injection 0.5 mL; Inject 0.5 mLs into the muscle once.  3. Supervision of normal pregnancy in third trimester Encouraged  classes and LARC.   **   Preterm labor symptoms and general obstetric precautions including but not limited to vaginal bleeding, contractions, leaking of fluid and fetal movement were reviewed in detail with the patient.  Please refer to After Visit Summary for other counseling recommendations.   Return in about 2 weeks (around 11/25/2014).   Lorene Dy, CNM

## 2014-11-11 NOTE — Progress Notes (Signed)
Records missing [facility] [place] [facility] records [phone]) and requested they be faxed to clinic-- stated they would this morning.   1138: [facility] records received. Anatomy scan normal and complete. To be scanned into chart.

## 2014-11-12 LAB — DRUG SCREEN, URINE
Amphetamine Screen, Ur: NEGATIVE
Barbiturate Quant, Ur: NEGATIVE
Benzodiazepines.: NEGATIVE
Cocaine Metabolites: NEGATIVE
Creatinine,U: 119.01 mg/dL
Marijuana Metabolite: NEGATIVE
Methadone: NEGATIVE
Opiates: NEGATIVE
Phencyclidine (PCP): NEGATIVE
Propoxyphene: NEGATIVE

## 2014-11-12 LAB — HIV ANTIBODY (ROUTINE TESTING W REFLEX): HIV 1&2 Ab, 4th Generation: NONREACTIVE

## 2014-11-19 ENCOUNTER — Encounter: Payer: Self-pay | Admitting: General Practice

## 2014-11-25 ENCOUNTER — Encounter: Payer: Self-pay | Admitting: *Deleted

## 2014-12-03 ENCOUNTER — Ambulatory Visit (INDEPENDENT_AMBULATORY_CARE_PROVIDER_SITE_OTHER): Payer: Medicaid Other | Admitting: Obstetrics and Gynecology

## 2014-12-03 VITALS — BP 112/70 | HR 86 | Temp 98.4°F | Wt 173.0 lb

## 2014-12-03 DIAGNOSIS — N898 Other specified noninflammatory disorders of vagina: Secondary | ICD-10-CM | POA: Diagnosis not present

## 2014-12-03 DIAGNOSIS — F1290 Cannabis use, unspecified, uncomplicated: Secondary | ICD-10-CM | POA: Diagnosis not present

## 2014-12-03 DIAGNOSIS — A749 Chlamydial infection, unspecified: Secondary | ICD-10-CM | POA: Diagnosis not present

## 2014-12-03 DIAGNOSIS — O26893 Other specified pregnancy related conditions, third trimester: Secondary | ICD-10-CM

## 2014-12-03 DIAGNOSIS — O98819 Other maternal infectious and parasitic diseases complicating pregnancy, unspecified trimester: Principal | ICD-10-CM

## 2014-12-03 DIAGNOSIS — Z3493 Encounter for supervision of normal pregnancy, unspecified, third trimester: Secondary | ICD-10-CM

## 2014-12-03 DIAGNOSIS — O99323 Drug use complicating pregnancy, third trimester: Secondary | ICD-10-CM

## 2014-12-03 DIAGNOSIS — O98319 Other infections with a predominantly sexual mode of transmission complicating pregnancy, unspecified trimester: Secondary | ICD-10-CM | POA: Diagnosis not present

## 2014-12-03 DIAGNOSIS — Z3483 Encounter for supervision of other normal pregnancy, third trimester: Secondary | ICD-10-CM

## 2014-12-03 LAB — POCT URINALYSIS DIP (DEVICE)
Bilirubin Urine: NEGATIVE
GLUCOSE, UA: NEGATIVE mg/dL
HGB URINE DIPSTICK: NEGATIVE
KETONES UR: NEGATIVE mg/dL
Nitrite: NEGATIVE
PH: 7 (ref 5.0–8.0)
Protein, ur: NEGATIVE mg/dL
SPECIFIC GRAVITY, URINE: 1.02 (ref 1.005–1.030)
UROBILINOGEN UA: 0.2 mg/dL (ref 0.0–1.0)

## 2014-12-03 MED ORDER — TERCONAZOLE 0.4 % VA CREA: 1.0000 | TOPICAL_CREAM | Freq: Every day | VAGINAL | Status: DC

## 2014-12-03 MED ORDER — HYDROCORTISONE 2.5 % RE CREA: 1.0000 "application " | TOPICAL_CREAM | Freq: Two times a day (BID) | RECTAL | Status: DC

## 2014-12-03 MED ORDER — DOCUSATE SODIUM 100 MG PO CAPS: 100.0000 mg | ORAL_CAPSULE | Freq: Two times a day (BID) | ORAL | Status: DC | PRN

## 2014-12-03 NOTE — Progress Notes (Signed)
Subjective:  Ariana Young is a 29 y.o. G1P0 at [redacted]w[redacted]d being seen today for ongoing prenatal care.  Patient reports white discharge and vaginal itching; thinks she has yeast from taking Flagyl course 2 wks ago. Also has hemorrhoid and occasional constipation. At times blood streaked tissue after BM.Has few UCs. FM good. Contractions: Not present.  Vag. Bleeding: None. Movement: Present. Denies leaking of fluid.   The following portions of the patient's history were reviewed and updated as appropriate: allergies, current medications, past family history, past medical history, past social history, past surgical history and problem list.   Objective:   Filed Vitals:   12/03/14 0846  BP: 112/70  Pulse: 86  Temp: 98.4 F (36.9 C)  Weight: 173 lb (78.472 kg)    Fetal Status: Fetal Heart Rate (bpm): 147   Movement: Present     General:  Alert, oriented and cooperative. Patient is in no acute distress.  Skin: Skin is warm and dry. No rash noted.   Cardiovascular: Normal heart rate noted  Respiratory: Normal respiratory effort, no problems with respiration noted  Abdomen: Soft, gravid, appropriate for gestational age. Pain/Pressure: Present     Vaginal: Vag. Bleeding: None.       Cervix: Not evaluated        Extremities: Normal range of motion.  Edema: Trace  Mental Status: Normal mood and affect. Normal behavior. Normal judgment and thought content.   Urinalysis: Urine Protein: Negative Urine Glucose: Negative  Assessment and Plan:  Pregnancy: G1P0 at [redacted]w[redacted]d  1. Chlamydia infection affecting pregnancy TOC neg. Partner was treated  2. Vaginal discharge during pregnancy, third trimester - Wet prep, genital Rx Terazol   Hemorrhoid> Colace and Anusol HC and general measures  3. Marijuana use    Preterm labor symptoms and general obstetric precautions including but not limited to vaginal bleeding, contractions, leaking of fluid and fetal movement were reviewed in detail with the  patient.  Please refer to After Visit Summary for other counseling recommendations.   No Follow-up on file.   Lorene Dy, CNM

## 2014-12-03 NOTE — Patient Instructions (Addendum)
Etonogestrel implant What is this medicine? ETONOGESTREL (et oh noe JES trel) is a contraceptive (birth control) device. It is used to prevent pregnancy. It can be used for up to 3 years. This medicine may be used for other purposes; ask your health care provider or pharmacist if you have questions. COMMON BRAND NAME(S): Implanon, Nexplanon What should I tell my health care provider before I take this medicine? They need to know if you have any of these conditions: -abnormal vaginal bleeding -blood vessel disease or blood clots -cancer of the breast, cervix, or liver -depression -diabetes -gallbladder disease -headaches -heart disease or recent heart attack -high blood pressure -high cholesterol -kidney disease -liver disease -renal disease -seizures -tobacco smoker -an unusual or allergic reaction to etonogestrel, other hormones, anesthetics or antiseptics, medicines, foods, dyes, or preservatives -pregnant or trying to get pregnant -breast-feeding How should I use this medicine? This device is inserted just under the skin on the inner side of your upper arm by a health care professional. Talk to your pediatrician regarding the use of this medicine in children. Special care may be needed. Overdosage: If you think you've taken too much of this medicine contact a poison control center or emergency room at once. Overdosage: If you think you have taken too much of this medicine contact a poison control center or emergency room at once. NOTE: This medicine is only for you. Do not share this medicine with others. What if I miss a dose? This does not apply. What may interact with this medicine? Do not take this medicine with any of the following medications: -amprenavir -bosentan -fosamprenavir This medicine may also interact with the following medications: -barbiturate medicines for inducing sleep or treating seizures -certain medicines for fungal infections like ketoconazole and  itraconazole -griseofulvin -medicines to treat seizures like carbamazepine, felbamate, oxcarbazepine, phenytoin, topiramate -modafinil -phenylbutazone -rifampin -some medicines to treat HIV infection like atazanavir, indinavir, lopinavir, nelfinavir, tipranavir, ritonavir -St. John's wort This list may not describe all possible interactions. Give your health care provider a list of all the medicines, herbs, non-prescription drugs, or dietary supplements you use. Also tell them if you smoke, drink alcohol, or use illegal drugs. Some items may interact with your medicine. What should I watch for while using this medicine? This product does not protect you against HIV infection (AIDS) or other sexually transmitted diseases. You should be able to feel the implant by pressing your fingertips over the skin where it was inserted. Tell your doctor if you cannot feel the implant. What side effects may I notice from receiving this medicine? Side effects that you should report to your doctor or health care professional as soon as possible: -allergic reactions like skin rash, itching or hives, swelling of the face, lips, or tongue -breast lumps -changes in vision -confusion, trouble speaking or understanding -dark urine -depressed mood -general ill feeling or flu-like symptoms -light-colored stools -loss of appetite, nausea -right upper belly pain -severe headaches -severe pain, swelling, or tenderness in the abdomen -shortness of breath, chest pain, swelling in a leg -signs of pregnancy -sudden numbness or weakness of the face, arm or leg -trouble walking, dizziness, loss of balance or coordination -unusual vaginal bleeding, discharge -unusually weak or tired -yellowing of the eyes or skin Side effects that usually do not require medical attention (Report these to your doctor or health care professional if they continue or are bothersome.): -acne -breast pain -changes in  weight -cough -fever or chills -headache -irregular menstrual bleeding -itching, burning, and   vaginal discharge -pain or difficulty passing urine -sore throat This list may not describe all possible side effects. Call your doctor for medical advice about side effects. You may report side effects to FDA at 1-800-FDA-1088. Where should I keep my medicine? This drug is given in a hospital or clinic and will not be stored at home. NOTE: This sheet is a summary. It may not cover all possible information. If you have questions about this medicine, talk to your doctor, pharmacist, or health care provider.  2015, [employer]/Gold Standard. (2011-11-14 15:37:45) Contraception Choices Contraception (birth control) is the use of any methods or devices to prevent pregnancy. Below are some methods to help avoid pregnancy. HORMONAL METHODS  1. Contraceptive implant. This is a thin, plastic tube containing progesterone hormone. It does not contain estrogen hormone. Your health care provider inserts the tube in the inner part of the upper arm. The tube can remain in place for up to 3 years. After 3 years, the implant must be removed. The implant prevents the ovaries from releasing an egg (ovulation), thickens the cervical mucus to prevent sperm from entering the uterus, and thins the lining of the inside of the uterus. 2. Progesterone-only injections. These injections are given every 3 months by your health care provider to prevent pregnancy. This synthetic progesterone hormone stops the ovaries from releasing eggs. It also thickens cervical mucus and changes the uterine lining. This makes it harder for sperm to survive in the uterus. 3. Birth control pills. These pills contain estrogen and progesterone hormone. They work by preventing the ovaries from releasing eggs (ovulation). They also cause the cervical mucus to thicken, preventing the sperm from entering the uterus. Birth control pills are prescribed by a  health care provider.Birth control pills can also be used to treat heavy periods. 4. Minipill. This type of birth control pill contains only the progesterone hormone. They are taken every day of each month and must be prescribed by your health care provider. 5. Birth control patch. The patch contains hormones similar to those in birth control pills. It must be changed once a week and is prescribed by a health care provider. 6. Vaginal ring. The ring contains hormones similar to those in birth control pills. It is left in the vagina for 3 weeks, removed for 1 week, and then a new one is put back in place. The patient must be comfortable inserting and removing the ring from the vagina.A health care provider's prescription is necessary. 7. Emergency contraception. Emergency contraceptives prevent pregnancy after unprotected sexual intercourse. This pill can be taken right after sex or up to 5 days after unprotected sex. It is most effective the sooner you take the pills after having sexual intercourse. Most emergency contraceptive pills are available without a prescription. Check with your pharmacist. Do not use emergency contraception as your only form of birth control. BARRIER METHODS  1. Female condom. This is a thin sheath (latex or rubber) that is worn over the penis during sexual intercourse. It can be used with spermicide to increase effectiveness. 2. Female condom. This is a soft, loose-fitting sheath that is put into the vagina before sexual intercourse. 3. Diaphragm. This is a soft, latex, dome-shaped barrier that must be fitted by a health care provider. It is inserted into the vagina, along with a spermicidal jelly. It is inserted before intercourse. The diaphragm should be left in the vagina for 6 to 8 hours after intercourse. 4. Cervical cap. This is a round, soft, latex or  plastic cup that fits over the cervix and must be fitted by a health care provider. The cap can be left in place for up to  48 hours after intercourse. 5. Sponge. This is a soft, circular piece of polyurethane foam. The sponge has spermicide in it. It is inserted into the vagina after wetting it and before sexual intercourse. 6. Spermicides. These are chemicals that kill or block sperm from entering the cervix and uterus. They come in the form of creams, jellies, suppositories, foam, or tablets. They do not require a prescription. They are inserted into the vagina with an applicator before having sexual intercourse. The process must be repeated every time you have sexual intercourse. INTRAUTERINE CONTRACEPTION 1. Intrauterine device (IUD). This is a T-shaped device that is put in a woman's uterus during a menstrual period to prevent pregnancy. There are 2 types: 1. Copper IUD. This type of IUD is wrapped in copper wire and is placed inside the uterus. Copper makes the uterus and fallopian tubes produce a fluid that kills sperm. It can stay in place for 10 years. 2. Hormone IUD. This type of IUD contains the hormone progestin (synthetic progesterone). The hormone thickens the cervical mucus and prevents sperm from entering the uterus, and it also thins the uterine lining to prevent implantation of a fertilized egg. The hormone can weaken or kill the sperm that get into the uterus. It can stay in place for 3-5 years, depending on which type of IUD is used. PERMANENT METHODS OF CONTRACEPTION  Female tubal ligation. This is when the woman's fallopian tubes are surgically sealed, tied, or blocked to prevent the egg from traveling to the uterus.  Hysteroscopic sterilization. This involves placing a small coil or insert into each fallopian tube. Your doctor uses a technique called hysteroscopy to do the procedure. The device causes scar tissue to form. This results in permanent blockage of the fallopian tubes, so the sperm cannot fertilize the egg. It takes about 3 months after the procedure for the tubes to become blocked. You must  use another form of birth control for these 3 months.  Female sterilization. This is when the female has the tubes that carry sperm tied off (vasectomy).This blocks sperm from entering the vagina during sexual intercourse. After the procedure, the man can still ejaculate fluid (semen). NATURAL PLANNING METHODS  Natural family planning. This is not having sexual intercourse or using a barrier method (condom, diaphragm, cervical cap) on days the woman could become pregnant.  Calendar method. This is keeping track of the length of each menstrual cycle and identifying when you are fertile.  Ovulation method. This is avoiding sexual intercourse during ovulation.  Symptothermal method. This is avoiding sexual intercourse during ovulation, using a thermometer and ovulation symptoms.  Post-ovulation method. This is timing sexual intercourse after you have ovulated. Regardless of which type or method of contraception you choose, it is important that you use condoms to protect against the transmission of sexually transmitted infections (STIs). Talk with your health care provider about which form of contraception is most appropriate for you. Document Released: 05/09/2005 Document Revised: 05/14/2013 Document Reviewed: 11/01/2012 [facility] Patient Information 2015 [place], [place]. This information is not intended to replace advice given to you by your health care provider. Make sure you discuss any questions you have with your health care provider. About Hemorrhoids  Hemorrhoids are swollen veins in the lower rectum and anus.  Also called piles, hemorrhoids are a common problem.  Hemorrhoids may be internal (inside  the rectum) or external (around the anus).  Internal Hemorrhoids  Internal hemorrhoids are often painless, but they rarely cause bleeding.  The internal veins may stretch and fall down (prolapse) through the anus to the outside of the body.  The veins may then become irritated and  painful.  External Hemorrhoids  External hemorrhoids can be easily seen or felt around the anal opening.  They are under the skin around the anus.  When the swollen veins are scratched or broken by straining, rubbing or wiping they sometimes bleed.  How Hemorrhoids Occur  Veins in the rectum and around the anus tend to swell under pressure.  Hemorrhoids can result from increased pressure in the veins of your anus or rectum.  Some sources of pressure are:   Straining to have a bowel movement because of constipation  Waiting too long to have a bowel movement  Coughing and sneezing often  Sitting for extended periods of time, including on the toilet  Diarrhea  Obesity  Trauma or injury to the anus  Some liver diseases  Stress  Family history of hemorrhoids  Pregnancy  Pregnant women should try to avoid becoming constipated, because they are more likely to have hemorrhoids during pregnancy.  In the last trimester of pregnancy, the enlarged uterus may press on blood vessels and causes hemorrhoids.  In addition, the strain of childbirth sometimes causes hemorrhoids after the birth.  Symptoms of Hemorrhoids  Some symptoms of hemorrhoids include:  Swelling and/or a tender lump around the anus  Itching, mild burning and bleeding around the anus  Painful bowel movements with or without constipation  Bright red blood covering the stool, on toilet paper or in the toilet bowel.   Symptoms usually go away within a few days.  Always talk to your doctor about any bleeding to make sure it is not from some other causes.  Diagnosing and Treating Hemorrhoids  Diagnosis is made by an examination by your healthcare provider.  Special test can be performed by your doctor.    Most cases of hemorrhoids can be treated with:  High-fiber diet: Eat more high-fiber foods, which help prevent constipation.  Ask for more detailed fiber information on types and sources of fiber from your  healthcare provider.  Fluids: Drink plenty of water.  This helps soften bowel movements so they are easier to pass.  Sitz baths and cold packs: Sitting in lukewarm water two or three times a day for 15 minutes cleases the anal area and may relieve discomfort.  If the water is too hot, swelling around the anus will get worse.  Placing a cloth-covered ice pack on the anus for ten minutes four times a day can also help reduce selling.  Gently pushing a prolapsed hemorrhoid back inside after the bath or ice pack can be helpful.  Medications: For mild discomfort, your healthcare provider may suggest over-the-counter pain medication or prescribe a cream or ointment for topical use.  The cream may contain witch hazel, zinc oxide or petroleum jelly.  Medicated suppositories are also a treatment option.  Always consult your doctor before applying medications or creams.  Procedures and surgeries: There are also a number of procedures and surgeries to shrink or remove hemorrhoids in more serious cases.  Talk to your physician about these options.  You can often prevent hemorrhoids or keep them from becoming worse by maintaining a healthy lifestyle.  Eat a fiber-rich diet of fruits, vegetables and whole grains.  Also, drink plenty of water and  exercise regularly.   2007, [employer] Doc.30

## 2014-12-03 NOTE — Progress Notes (Signed)
Edema- ankles/legs  Pain/pressure- pelvic  Vaginal discharge- itchy and white TOC tday

## 2014-12-04 LAB — WET PREP, GENITAL
CLUE CELLS WET PREP: NONE SEEN
Trich, Wet Prep: NONE SEEN

## 2014-12-04 NOTE — Progress Notes (Signed)
WP showed yeast

## 2014-12-17 ENCOUNTER — Ambulatory Visit (INDEPENDENT_AMBULATORY_CARE_PROVIDER_SITE_OTHER): Payer: Medicaid Other | Admitting: Advanced Practice Midwife

## 2014-12-17 VITALS — BP 111/73 | HR 71 | Temp 98.3°F | Wt 176.7 lb

## 2014-12-17 DIAGNOSIS — Z3483 Encounter for supervision of other normal pregnancy, third trimester: Secondary | ICD-10-CM

## 2014-12-17 DIAGNOSIS — Z3493 Encounter for supervision of normal pregnancy, unspecified, third trimester: Secondary | ICD-10-CM

## 2014-12-17 LAB — POCT URINALYSIS DIP (DEVICE)
Bilirubin Urine: NEGATIVE
GLUCOSE, UA: NEGATIVE mg/dL
HGB URINE DIPSTICK: NEGATIVE
KETONES UR: NEGATIVE mg/dL
Nitrite: NEGATIVE
PH: 7 (ref 5.0–8.0)
Protein, ur: NEGATIVE mg/dL
Specific Gravity, Urine: 1.025 (ref 1.005–1.030)
Urobilinogen, UA: 0.2 mg/dL (ref 0.0–1.0)

## 2014-12-17 NOTE — Progress Notes (Signed)
Subjective:  Ariana Young is a 28 y.o. G1P0 at [redacted]w[redacted]d being seen today for ongoing prenatal care.  Patient reports no complaints.  Contractions: Not present.  Vag. Bleeding: None. Movement: Present. Denies leaking of fluid.   The following portions of the patient's history were reviewed and updated as appropriate: allergies, current medications, past family history, past medical history, past social history, past surgical history and problem list.   Objective:   Filed Vitals:   12/17/14 1021  BP: 111/73  Pulse: 71  Temp: 98.3 F (36.8 C)  Weight: 176 lb 11.2 oz (80.151 kg)    Fetal Status: Fetal Heart Rate (bpm): 145 Fundal Height: 36 cm Movement: Present     General:  Alert, oriented and cooperative. Patient is in no acute distress.  Skin: Skin is warm and dry. No rash noted.   Cardiovascular: Normal heart rate noted  Respiratory: Normal respiratory effort, no problems with respiration noted  Abdomen: Soft, gravid, appropriate for gestational age. Pain/Pressure: Present     Vaginal: Vag. Bleeding: None.    Vag D/C Character: White  Cervix: Exam revealed Dilation: 1 Effacement (%): 50 Station: -31/50/-3  Extremities: Normal range of motion.  Edema: Trace  Mental Status: Normal mood and affect. Normal behavior. Normal judgment and thought content.   Urinalysis: Urine Protein: Negative Urine Glucose: Negative  Assessment and Plan:  Pregnancy: G1P0 at [redacted]w[redacted]d  There are no diagnoses linked to this encounter. Term labor symptoms and general obstetric precautions including but not limited to vaginal bleeding, contractions, leaking of fluid and fetal movement were reviewed in detail with the patient. Please refer to After Visit Summary for other counseling recommendations.   Return in about 1 week (around 12/24/2014).   Elvera Maria, CNM

## 2014-12-18 ENCOUNTER — Inpatient Hospital Stay (HOSPITAL_COMMUNITY)
Admission: AD | Admit: 2014-12-18 | Discharge: 2014-12-18 | Disposition: A | Discharge status: Home / Self Care | Payer: Medicaid Other | Source: Ambulatory Visit | Attending: Obstetrics & Gynecology | Admitting: Obstetrics & Gynecology

## 2014-12-18 ENCOUNTER — Encounter (HOSPITAL_COMMUNITY): Payer: Self-pay | Admitting: *Deleted

## 2014-12-18 DIAGNOSIS — O4703 False labor before 37 completed weeks of gestation, third trimester: Secondary | ICD-10-CM | POA: Diagnosis not present

## 2014-12-18 DIAGNOSIS — Z87891 Personal history of nicotine dependence: Secondary | ICD-10-CM | POA: Insufficient documentation

## 2014-12-18 DIAGNOSIS — O479 False labor, unspecified: Secondary | ICD-10-CM | POA: Diagnosis not present

## 2014-12-18 DIAGNOSIS — Z3A36 36 weeks gestation of pregnancy: Secondary | ICD-10-CM | POA: Insufficient documentation

## 2014-12-18 LAB — URINALYSIS, ROUTINE W REFLEX MICROSCOPIC
Bilirubin Urine: NEGATIVE
GLUCOSE, UA: NEGATIVE mg/dL
HGB URINE DIPSTICK: NEGATIVE
Ketones, ur: NEGATIVE mg/dL
Nitrite: NEGATIVE
PH: 6.5 (ref 5.0–8.0)
Protein, ur: NEGATIVE mg/dL
Specific Gravity, Urine: 1.015 (ref 1.005–1.030)
Urobilinogen, UA: 0.2 mg/dL (ref 0.0–1.0)

## 2014-12-18 LAB — URINE MICROSCOPIC-ADD ON

## 2014-12-18 LAB — GC/CHLAMYDIA PROBE AMP
CT Probe RNA: NEGATIVE
GC PROBE AMP APTIMA: NEGATIVE

## 2014-12-18 NOTE — MAU Provider Note (Signed)
History     CSN: [id number]  Arrival date and time: 12/18/14 1615   First Provider Initiated Contact with Patient 12/18/14 1726      Chief Complaint  Patient presents with  . Abdominal Cramping   HPI  [name] is a 29 y.o. G1P0 at [redacted]w[redacted]d here with report of contractions that started this morning.  Contractions occurred every 10-15 minutes.  Denies vaginal bleeding or leaking of fluid.  +mucus discharge.    Past Medical History  Diagnosis Date  . Asthma   . Ovarian cyst     Past Surgical History  Procedure Laterality Date  . Ovarian cyst surgery      Family History  Problem Relation Age of Onset  . Diabetes Mother   . Hypertension Mother   . Heart disease Mother     History  Substance Use Topics  . Smoking status: Former Smoker -- 0.50 packs/day    Types: Cigarettes  . Smokeless tobacco: Not on file  . Alcohol Use: No    Allergies:  Allergies  Allergen Reactions  . Peanut-Containing Drug Products Anaphylaxis and Hives  . Peanuts [Peanut Oil] Anaphylaxis and Hives    Prescriptions prior to admission  Medication Sig Dispense Refill Last Dose  . albuterol (PROVENTIL HFA;VENTOLIN HFA) 108 (90 BASE) MCG/ACT inhaler Inhale 2 puffs into the lungs every 4 (four) hours as needed for wheezing or shortness of breath.    Past Week at Unknown time  . calcium carbonate (TUMS - DOSED IN MG ELEMENTAL CALCIUM) 500 MG chewable tablet Chew 1 tablet by mouth daily as needed for indigestion or heartburn.   12/17/2014 at Unknown time  . hydrocortisone (ANUSOL-HC) 2.5 % rectal cream Place 1 application rectally 2 (two) times daily. 30 g 0 Past Week at Unknown time  . Prenatal Vit-Fe Fumarate-FA (PRENATAL MULTIVITAMIN) TABS tablet Take 1 tablet by mouth daily.   12/17/2014 at Unknown time  . docusate sodium (COLACE) 100 MG capsule Take 1 capsule (100 mg total) by mouth 2 (two) times daily as needed. (Patient not taking: Reported on 12/18/2014) 30 capsule 2 Not Taking at Unknown  time  . EPINEPHrine (EPIPEN) 0.3 mg/0.3 mL DEVI Inject 0.3 mg into the muscle once as needed (for allergic reaction).    Emergency  . terconazole (TERAZOL 7) 0.4 % vaginal cream Place 1 applicator vaginally at bedtime. (Patient not taking: Reported on 12/17/2014) 45 g 0 Not Taking at Unknown time    Review of Systems  Constitutional: Negative for fever and chills.  Eyes: Negative for blurred vision and double vision.  Gastrointestinal: Positive for abdominal pain. Negative for nausea and vomiting.  Genitourinary: Negative for dysuria, urgency and frequency.  Musculoskeletal: Negative for myalgias and back pain.  Neurological: Negative for headaches.  All other systems reviewed and are negative.  Physical Exam   Blood pressure 111/70, pulse 76, temperature 98.1 F (36.7 C), temperature source Oral, resp. rate 18, weight 80.559 kg (177 lb 9.6 oz), last menstrual period 04/12/2014.  Physical Exam  Constitutional: She is oriented to person, place, and time. She appears well-developed and well-nourished. No distress.  HENT:  Head: Normocephalic.  Neck: Normal range of motion. Neck supple.  Cardiovascular: Normal rate, regular rhythm and normal heart sounds.   Respiratory: Effort normal and breath sounds normal.  GI: Soft. There is no tenderness.  Genitourinary: No bleeding in the vagina. Vaginal discharge (mucusy) found.  Neurological: She is alert and oriented to person, place, and time.  Skin: Skin is warm  and dry.   FHR 130's, +accels Toco - irregular  MAU Course  Procedures  Results for orders placed or performed during the hospital encounter of 12/18/14 (from the past 24 hour(s))  Urinalysis, Routine w reflex microscopic (not at [facility])     Status: Abnormal   Collection Time: 12/18/14  4:40 PM  Result Value Ref Range   Color, Urine YELLOW YELLOW   APPearance CLEAR CLEAR   Specific Gravity, Urine 1.015 1.005 - 1.030   pH 6.5 5.0 - 8.0   Glucose, UA NEGATIVE NEGATIVE mg/dL    Hgb urine dipstick NEGATIVE NEGATIVE   Bilirubin Urine NEGATIVE NEGATIVE   Ketones, ur NEGATIVE NEGATIVE mg/dL   Protein, ur NEGATIVE NEGATIVE mg/dL   Urobilinogen, UA 0.2 0.0 - 1.0 mg/dL   Nitrite NEGATIVE NEGATIVE   Leukocytes, UA TRACE (A) NEGATIVE  Urine microscopic-add on     Status: Abnormal   Collection Time: 12/18/14  4:40 PM  Result Value Ref Range   Squamous Epithelial / LPF FEW (A) RARE   WBC, UA 3-6 <3 WBC/hpf   RBC / HPF 0-2 <3 RBC/hpf   Bacteria, UA FEW (A) RARE   Urine-Other RARE YEAST      Assessment and Plan  29 y.o. G1P0 at [redacted]w[redacted]d IUP Braxton Hicks Reactive NST  Plan: Discharge to home Reviewed signs of labor Keep scheduled appointment  Kathrine Haddock N 12/18/2014, 5:36 PM

## 2014-12-18 NOTE — Discharge Instructions (Signed)
Braxton Hicks Contractions °Contractions of the uterus can occur throughout pregnancy. Contractions are not always a sign that you are in labor.  °WHAT ARE BRAXTON HICKS CONTRACTIONS?  °Contractions that occur before labor are called Braxton Hicks contractions, or false labor. Toward the end of pregnancy (32-34 weeks), these contractions can develop more often and may become more forceful. This is not true labor because these contractions do not result in opening (dilatation) and thinning of the cervix. They are sometimes difficult to tell apart from true labor because these contractions can be forceful and people have different pain tolerances. You should not feel embarrassed if you go to the hospital with false labor. Sometimes, the only way to tell if you are in true labor is for your health care provider to look for changes in the cervix. °If there are no prenatal problems or other health problems associated with the pregnancy, it is completely safe to be sent home with false labor and await the onset of true labor. °HOW CAN YOU TELL THE DIFFERENCE BETWEEN TRUE AND FALSE LABOR? °False Labor °· The contractions of false labor are usually shorter and not as hard as those of true labor.   °· The contractions are usually irregular.   °· The contractions are often felt in the front of the lower abdomen and in the groin.   °· The contractions may go away when you walk around or change positions while lying down.   °· The contractions get weaker and are shorter lasting as time goes on.   °· The contractions do not usually become progressively stronger, regular, and closer together as with true labor.   °True Labor °· Contractions in true labor last 30-70 seconds, become very regular, usually become more intense, and increase in frequency.   °· The contractions do not go away with walking.   °· The discomfort is usually felt in the top of the uterus and spreads to the lower abdomen and low back.   °· True labor can be  determined by your health care provider with an exam. This will show that the cervix is dilating and getting thinner.   °WHAT TO REMEMBER °· Keep up with your usual exercises and follow other instructions given by your health care provider.   °· Take medicines as directed by your health care provider.   °· Keep your regular prenatal appointments.   °· Eat and drink lightly if you think you are going into labor.   °· If Braxton Hicks contractions are making you uncomfortable:   °¨ Change your position from lying down or resting to walking, or from walking to resting.   °¨ Sit and rest in a tub of warm water.   °¨ Drink 2-3 glasses of water. Dehydration may cause these contractions.   °¨ Do slow and deep breathing several times an hour.   °WHEN SHOULD I SEEK IMMEDIATE MEDICAL CARE? °Seek immediate medical care if: °· Your contractions become stronger, more regular, and closer together.   °· You have fluid leaking or gushing from your vagina.   °· You have a fever.   °· You pass blood-tinged mucus.   °· You have vaginal bleeding.   °· You have continuous abdominal pain.   °· You have low back pain that you never had before.   °· You feel your baby's head pushing down and causing pelvic pressure.   °· Your baby is not moving as much as it used to.   °Document Released: 05/09/2005 Document Revised: 05/14/2013 Document Reviewed: 02/18/2013 °[employer]® Patient Information ©2015 [employer]. This information is not intended to replace advice given to you by your health care   provider. Make sure you discuss any questions you have with your health care provider. ° °

## 2014-12-18 NOTE — MAU Note (Signed)
Woke up from nap, having sharp pains down there, abd tighening up and cramping.  Feels like she needs to urinate, but nothing comes out.

## 2014-12-19 LAB — OB RESULTS CONSOLE GBS: GBS: NEGATIVE

## 2014-12-19 LAB — CULTURE, BETA STREP (GROUP B ONLY)

## 2014-12-26 ENCOUNTER — Ambulatory Visit (INDEPENDENT_AMBULATORY_CARE_PROVIDER_SITE_OTHER): Payer: Medicaid Other | Admitting: Advanced Practice Midwife

## 2014-12-26 ENCOUNTER — Encounter: Payer: Self-pay | Admitting: Advanced Practice Midwife

## 2014-12-26 VITALS — BP 110/70 | HR 86 | Temp 98.6°F | Wt 182.5 lb

## 2014-12-26 DIAGNOSIS — Z3483 Encounter for supervision of other normal pregnancy, third trimester: Secondary | ICD-10-CM

## 2014-12-26 DIAGNOSIS — Z3493 Encounter for supervision of normal pregnancy, unspecified, third trimester: Secondary | ICD-10-CM

## 2014-12-26 LAB — POCT URINALYSIS DIP (DEVICE)
BILIRUBIN URINE: NEGATIVE
GLUCOSE, UA: NEGATIVE mg/dL
HGB URINE DIPSTICK: NEGATIVE
KETONES UR: NEGATIVE mg/dL
Nitrite: NEGATIVE
Protein, ur: NEGATIVE mg/dL
SPECIFIC GRAVITY, URINE: 1.015 (ref 1.005–1.030)
Urobilinogen, UA: 0.2 mg/dL (ref 0.0–1.0)
pH: 7 (ref 5.0–8.0)

## 2014-12-26 NOTE — Progress Notes (Signed)
Subjective:  Ariana Young is a 29 y.o. G1P0 at [redacted]w[redacted]d being seen today for ongoing prenatal care.  Patient reports no complaints.  Contractions: Irregular.  Vag. Bleeding: None. Movement: Present. Denies leaking of fluid.   The following portions of the patient's history were reviewed and updated as appropriate: allergies, current medications, past family history, past medical history, past social history, past surgical history and problem list.   Objective:   Filed Vitals:   12/26/14 1111  BP: 110/70  Pulse: 86  Temp: 98.6 F (37 C)  Weight: 182 lb 8 oz (82.781 kg)    Fetal Status: Fetal Heart Rate (bpm): 145   Movement: Present     General:  Alert, oriented and cooperative. Patient is in no acute distress.  Skin: Skin is warm and dry. No rash noted.   Cardiovascular: Normal heart rate noted  Respiratory: Normal respiratory effort, no problems with respiration noted  Abdomen: Soft, gravid, appropriate for gestational age. Pain/Pressure: Present     Vaginal: Vag. Bleeding: None.    Vag D/C Character: White  Cervix: Exam revealed       Tight 1/40/-3/vertex  Extremities: Normal range of motion.  Edema: Trace  Mental Status: Normal mood and affect. Normal behavior. Normal judgment and thought content.   Urinalysis: Urine Protein: Negative Urine Glucose: Negative  Assessment and Plan:  Pregnancy: G1P0 at [redacted]w[redacted]d  1. Supervision of normal pregnancy in third trimester     Prodromal irregular contractions.  Discussed expected labor course. Decided against waterbirth.   Term labor symptoms and general obstetric precautions including but not limited to vaginal bleeding, contractions, leaking of fluid and fetal movement were reviewed in detail with the patient. Please refer to After Visit Summary for other counseling recommendations.  Return in about 1 week (around 01/02/2015) for [facility].   Seabron Spates, CNM

## 2014-12-26 NOTE — Patient Instructions (Signed)

## 2014-12-26 NOTE — Progress Notes (Signed)
Large Leukocytes noted on urinalysis. C.o having contractions mostly every hour last 2 days.

## 2014-12-29 ENCOUNTER — Encounter (HOSPITAL_COMMUNITY): Payer: Self-pay | Admitting: *Deleted

## 2014-12-29 ENCOUNTER — Inpatient Hospital Stay (HOSPITAL_COMMUNITY)
Admission: AD | Admit: 2014-12-29 | Discharge: 2014-12-29 | Disposition: A | Discharge status: Home / Self Care | Payer: Medicaid Other | Source: Ambulatory Visit | Attending: Family Medicine | Admitting: Family Medicine

## 2014-12-29 DIAGNOSIS — O471 False labor at or after 37 completed weeks of gestation: Secondary | ICD-10-CM | POA: Diagnosis not present

## 2014-12-29 DIAGNOSIS — R040 Epistaxis: Secondary | ICD-10-CM | POA: Insufficient documentation

## 2014-12-29 DIAGNOSIS — Z3A38 38 weeks gestation of pregnancy: Secondary | ICD-10-CM | POA: Diagnosis not present

## 2014-12-29 DIAGNOSIS — O9989 Other specified diseases and conditions complicating pregnancy, childbirth and the puerperium: Secondary | ICD-10-CM

## 2014-12-29 DIAGNOSIS — Z87891 Personal history of nicotine dependence: Secondary | ICD-10-CM | POA: Diagnosis not present

## 2014-12-29 NOTE — MAU Note (Signed)
Urine in lab 

## 2014-12-29 NOTE — MAU Note (Signed)
Pt states here for nosebleed that lasted 15 minutes. Also did have mild contractions. No bleeding or abnormal vaginal discharge. +FM

## 2014-12-29 NOTE — Discharge Instructions (Signed)

## 2014-12-29 NOTE — MAU Provider Note (Signed)
History   G1 at 38.[redacted] wks gestation in with c/o irreg contractions and nosebleed which resolved by the time she got to hospital.  CSN: [id number]  Arrival date and time: 12/29/14 1535   First Provider Initiated Contact with Patient 12/29/14 1651      No chief complaint on file.  HPI  OB History    Gravida Para Term Preterm AB TAB SAB Ectopic Multiple Living   1         0      Past Medical History  Diagnosis Date  . Asthma   . Ovarian cyst     Past Surgical History  Procedure Laterality Date  . Ovarian cyst surgery    . Duramoid      cyst re;moved in 09  . Wisdom tooth extraction      Family History  Problem Relation Age of Onset  . Diabetes Mother   . Hypertension Mother   . Heart disease Mother     History  Substance Use Topics  . Smoking status: Former Smoker -- 0.50 packs/day    Types: Cigarettes    Quit date: 05/30/2014  . Smokeless tobacco: Not on file  . Alcohol Use: No    Allergies:  Allergies  Allergen Reactions  . Peanut-Containing Drug Products Anaphylaxis and Hives  . Peanuts [Peanut Oil] Anaphylaxis and Hives    Prescriptions prior to admission  Medication Sig Dispense Refill Last Dose  . albuterol (PROVENTIL HFA;VENTOLIN HFA) 108 (90 BASE) MCG/ACT inhaler Inhale 1-2 puffs into the lungs every 4 (four) hours as needed for wheezing or shortness of breath.    rescue  . calcium carbonate (TUMS - DOSED IN MG ELEMENTAL CALCIUM) 500 MG chewable tablet Chew 1-2 tablets by mouth daily as needed for indigestion or heartburn.    Past Week at Unknown time  . EPINEPHrine (EPIPEN) 0.3 mg/0.3 mL DEVI Inject 0.3 mg into the muscle once as needed (for allergic reaction).    rescue  . Prenatal Vit-Fe Fumarate-FA (PRENATAL MULTIVITAMIN) TABS tablet Take 1 tablet by mouth daily.   12/29/2014 at 1200  . ranitidine (ZANTAC) 75 MG tablet Take 75 mg by mouth daily as needed for heartburn.   Past Week at Unknown time  . hydrocortisone (ANUSOL-HC) 2.5 % rectal cream  Place 1 application rectally 2 (two) times daily. (Patient not taking: Reported on 12/29/2014) 30 g 0 Not Taking at Unknown time  . terconazole (TERAZOL 7) 0.4 % vaginal cream Place 1 applicator vaginally at bedtime. (Patient not taking: Reported on 12/17/2014) 45 g 0 Completed Course at Unknown time    Review of Systems  Constitutional: Negative.   HENT: Negative.   Eyes: Negative.   Respiratory: Negative.   Cardiovascular: Negative.   Gastrointestinal: Positive for abdominal pain.  Genitourinary: Negative.   Musculoskeletal: Negative.   Skin: Negative.   Neurological: Negative.   Endo/Heme/Allergies: Negative.   Psychiatric/Behavioral: Negative.    Physical Exam   Blood pressure 119/83, pulse 72, temperature 97.4 F (36.3 C), temperature source Oral, resp. rate 16, height 5\' 7"  (1.702 m), weight 183 lb 8 oz (83.235 kg), last menstrual period 04/12/2014.  Physical Exam  Constitutional: She is oriented to person, place, and time. She appears well-developed and well-nourished.  HENT:  Head: Normocephalic.  Eyes: Pupils are equal, round, and reactive to light.  Neck: Normal range of motion. Neck supple.  Cardiovascular: Normal rate, regular rhythm, normal heart sounds and intact distal pulses.   Respiratory: Effort normal and breath sounds normal.  GI: Soft. Bowel sounds are normal.  Genitourinary: Vagina normal and uterus normal.  Gravid uterus  Musculoskeletal: Normal range of motion.  Neurological: She is alert and oriented to person, place, and time. She has normal reflexes.  Skin: Skin is warm and dry.  Psychiatric: She has a normal mood and affect. Her behavior is normal. Judgment and thought content normal.    MAU Course  Procedures  MDM nosebleed  Assessment and Plan  FHR pattern reassurring, discussed care of future nosebleeds and will d/c pt home at her request.  Abbott Pao 12/29/2014, 5:12 PM

## 2015-01-05 ENCOUNTER — Inpatient Hospital Stay (HOSPITAL_COMMUNITY)
Admission: AD | Admit: 2015-01-05 | Discharge: 2015-01-05 | Disposition: A | Discharge status: Home / Self Care | Payer: Medicaid Other | Source: Ambulatory Visit | Attending: Obstetrics & Gynecology | Admitting: Obstetrics & Gynecology

## 2015-01-05 ENCOUNTER — Encounter (HOSPITAL_COMMUNITY): Payer: Self-pay

## 2015-01-05 DIAGNOSIS — Z3493 Encounter for supervision of normal pregnancy, unspecified, third trimester: Secondary | ICD-10-CM | POA: Diagnosis not present

## 2015-01-05 NOTE — Progress Notes (Signed)
Patient given ice water 

## 2015-01-05 NOTE — MAU Note (Signed)
Contractions woke her around noon, coming ? Every 5 min.  No bleeding or leaking. Was one when last checked.

## 2015-01-05 NOTE — MAU Note (Signed)
Contractions woke her up around 12

## 2015-01-05 NOTE — Discharge Instructions (Signed)
Fetal Movement Counts °Patient Name: __________________________________________________ Patient Due Date: ____________________ °Performing a fetal movement count is highly recommended in high-risk pregnancies, but it is good for every pregnant woman to do. Your health care provider may ask you to start counting fetal movements at 28 weeks of the pregnancy. Fetal movements often increase: °· After eating a full meal. °· After physical activity. °· After eating or drinking something sweet or cold. °· At rest. °Pay attention to when you feel the baby is most active. This will help you notice a pattern of your baby's sleep and wake cycles and what factors contribute to an increase in fetal movement. It is important to perform a fetal movement count at the same time each day when your baby is normally most active.  °HOW TO COUNT FETAL MOVEMENTS °1. Find a quiet and comfortable area to sit or lie down on your left side. Lying on your left side provides the best blood and oxygen circulation to your baby. °2. Write down the day and time on a sheet of paper or in a journal. °3. Start counting kicks, flutters, swishes, rolls, or jabs in a 2-hour period. You should feel at least 10 movements within 2 hours. °4. If you do not feel 10 movements in 2 hours, wait 2-3 hours and count again. Look for a change in the pattern or not enough counts in 2 hours. °SEEK MEDICAL CARE IF: °· You feel less than 10 counts in 2 hours, tried twice. °· There is no movement in over an hour. °· The pattern is changing or taking longer each day to reach 10 counts in 2 hours. °· You feel the baby is not moving as he or she usually does. °Date: ____________ Movements: ____________ Start time: ____________ Finish time: ____________  °Date: ____________ Movements: ____________ Start time: ____________ Finish time: ____________ °Date: ____________ Movements: ____________ Start time: ____________ Finish time: ____________ °Date: ____________ Movements:  ____________ Start time: ____________ Finish time: ____________ °Date: ____________ Movements: ____________ Start time: ____________ Finish time: ____________ °Date: ____________ Movements: ____________ Start time: ____________ Finish time: ____________ °Date: ____________ Movements: ____________ Start time: ____________ Finish time: ____________ °Date: ____________ Movements: ____________ Start time: ____________ Finish time: ____________  °Date: ____________ Movements: ____________ Start time: ____________ Finish time: ____________ °Date: ____________ Movements: ____________ Start time: ____________ Finish time: ____________ °Date: ____________ Movements: ____________ Start time: ____________ Finish time: ____________ °Date: ____________ Movements: ____________ Start time: ____________ Finish time: ____________ °Date: ____________ Movements: ____________ Start time: ____________ Finish time: ____________ °Date: ____________ Movements: ____________ Start time: ____________ Finish time: ____________ °Date: ____________ Movements: ____________ Start time: ____________ Finish time: ____________  °Date: ____________ Movements: ____________ Start time: ____________ Finish time: ____________ °Date: ____________ Movements: ____________ Start time: ____________ Finish time: ____________ °Date: ____________ Movements: ____________ Start time: ____________ Finish time: ____________ °Date: ____________ Movements: ____________ Start time: ____________ Finish time: ____________ °Date: ____________ Movements: ____________ Start time: ____________ Finish time: ____________ °Date: ____________ Movements: ____________ Start time: ____________ Finish time: ____________ °Date: ____________ Movements: ____________ Start time: ____________ Finish time: ____________  °Date: ____________ Movements: ____________ Start time: ____________ Finish time: ____________ °Date: ____________ Movements: ____________ Start time: ____________ Finish  time: ____________ °Date: ____________ Movements: ____________ Start time: ____________ Finish time: ____________ °Date: ____________ Movements: ____________ Start time: ____________ Finish time: ____________ °Date: ____________ Movements: ____________ Start time: ____________ Finish time: ____________ °Date: ____________ Movements: ____________ Start time: ____________ Finish time: ____________ °Date: ____________ Movements: ____________ Start time: ____________ Finish time: ____________  °Date: ____________ Movements: ____________ Start time: ____________ Finish   time: ____________ Date: ____________ Movements: ____________ Start time: ____________ Ariana Young time: ____________ Date: ____________ Movements: ____________ Start time: ____________ Ariana Young time: ____________ Date: ____________ Movements: ____________ Start time: ____________ Ariana Young time: ____________ Date: ____________ Movements: ____________ Start time: ____________ Ariana Young time: ____________ Date: ____________ Movements: ____________ Start time: ____________ Ariana Young time: ____________ Date: ____________ Movements: ____________ Start time: ____________ Ariana Young time: ____________  Date: ____________ Movements: ____________ Start time: ____________ Ariana Young time: ____________ Date: ____________ Movements: ____________ Start time: ____________ Ariana Young time: ____________ Date: ____________ Movements: ____________ Start time: ____________ Ariana Young time: ____________ Date: ____________ Movements: ____________ Start time: ____________ Ariana Young time: ____________ Date: ____________ Movements: ____________ Start time: ____________ Ariana Young time: ____________ Date: ____________ Movements: ____________ Start time: ____________ Ariana Young time: ____________ Date: ____________ Movements: ____________ Start time: ____________ Ariana Young time: ____________  Date: ____________ Movements: ____________ Start time: ____________ Ariana Young time: ____________ Date: ____________  Movements: ____________ Start time: ____________ Ariana Young time: ____________ Date: ____________ Movements: ____________ Start time: ____________ Ariana Young time: ____________ Date: ____________ Movements: ____________ Start time: ____________ Ariana Young time: ____________ Date: ____________ Movements: ____________ Start time: ____________ Ariana Young time: ____________ Date: ____________ Movements: ____________ Start time: ____________ Ariana Young time: ____________ Date: ____________ Movements: ____________ Start time: ____________ Ariana Young time: ____________  Date: ____________ Movements: ____________ Start time: ____________ Ariana Young time: ____________ Date: ____________ Movements: ____________ Start time: ____________ Ariana Young time: ____________ Date: ____________ Movements: ____________ Start time: ____________ Ariana Young time: ____________ Date: ____________ Movements: ____________ Start time: ____________ Ariana Young time: ____________ Date: ____________ Movements: ____________ Start time: ____________ Ariana Young time: ____________ Date: ____________ Movements: ____________ Start time: ____________ Ariana Young time: ____________ Document Released: 06/08/2006 Document Revised: 09/23/2013 Document Reviewed: 03/05/2012 [employer] Patient Information 2015 [employer]. This information is not intended to replace advice given to you by your health care provider. Make sure you discuss any questions you have with your health care provider. Vaginal Delivery During delivery, your health care provider will help you give birth to your baby. During a vaginal delivery, you will work to push the baby out of your vagina. However, before you can push your baby out, a few things need to happen. The opening of your uterus (cervix) has to soften, thin out, and open up (dilate) all the way to 10 cm. Also, your baby has to move down from the uterus into your vagina.  SIGNS OF LABOR  Your health care provider will first need to make sure you are in labor.  Signs of labor include:   Passing what is called the mucous plug before labor begins. This is a small amount of blood-stained mucus.  Having regular, painful uterine contractions.   The time between contractions gets shorter.   The discomfort and pain gradually get more intense.  Contraction pains get worse when walking and do not go away when resting.   Your cervix becomes thinner (effacement) and dilates. BEFORE THE DELIVERY Once you are in labor and admitted into the hospital or care center, your health care provider may do the following:  5. Perform a complete physical exam. 6. Review any complications related to pregnancy or labor. 7. Check your blood pressure, pulse, temperature, and heart rate (vital signs).  8. Determine if, and when, the rupture of amniotic membranes occurred. 9. Do a vaginal exam (using a sterile glove and lubricant) to determine:  1. The position (presentation) of the baby. Is the baby's head presenting first (vertex) in the birth canal (vagina), or are the feet or buttocks first (breech)?  2. The level (station) of the baby's head within the birth canal.  3.  The effacement and dilatation of the cervix.  10. An electronic fetal monitor is usually placed on your abdomen when you first arrive. This is used to monitor your contractions and the baby's heart rate. 1. When the monitor is on your abdomen (external fetal monitor), it can only pick up the frequency and length of your contractions. It cannot tell the strength of your contractions. 2. If it becomes necessary for your health care provider to know exactly how strong your contractions are or to see exactly what the baby's heart rate is doing, an internal monitor may be inserted into your vagina and uterus. Your health care provider will discuss the benefits and risks of using an internal monitor and obtain your permission before inserting the device. 3. Continuous fetal monitoring may be needed if you  have an epidural, are receiving certain medicines (such as oxytocin), or have pregnancy or labor complications. 11. An IV access tube may be placed into a vein in your arm to deliver fluids and medicines if necessary. THREE STAGES OF LABOR AND DELIVERY Normal labor and delivery is divided into three stages. First Stage This stage starts when you begin to contract regularly and your cervix begins to efface and dilate. It ends when your cervix is completely open (fully dilated). The first stage is the longest stage of labor and can last from 3 hours to 15 hours.  Several methods are available to help with labor pain. You and your health care provider will decide which option is best for you. Options include:   Opioid medicines. These are strong pain medicines that you can get through your IV tube or as a shot into your muscle. These medicines lessen pain but do not make it go away completely.  Epidural. A medicine is given through a thin tube that is inserted in your back. The medicine numbs the lower part of your body and prevents any pain in that area.  Paracervical pain medicine. This is an injection of an anesthetic on each side of your cervix.   You may request natural childbirth, which does not involve the use of pain medicines or an epidural during labor and delivery. Instead, you will use other things, such as breathing exercises, to help cope with the pain. Second Stage The second stage of labor begins when your cervix is fully dilated at 10 cm. It continues until you push your baby down through the birth canal and the baby is born. This stage can take only minutes or several hours.  The location of your baby's head as it moves through the birth canal is reported as a number called a station. If the baby's head has not started its descent, the station is described as being at minus 3 (-3). When your baby's head is at the zero station, it is at the middle of the birth canal and is engaged  in the pelvis. The station of your baby helps indicate the progress of the second stage of labor.  When your baby is born, your health care provider may hold the baby with his or her head lowered to prevent amniotic fluid, mucus, and blood from getting into the baby's lungs. The baby's mouth and nose may be suctioned with a small bulb syringe to remove any additional fluid.  Your health care provider may then place the baby on your stomach. It is important to keep the baby from getting cold. To do this, the health care provider will dry the baby off, place the  baby directly on your skin (with no blankets between you and the baby), and cover the baby with warm, dry blankets.   The umbilical cord is cut. Third Stage During the third stage of labor, your health care provider will deliver the placenta (afterbirth) and make sure your bleeding is under control. The delivery of the placenta usually takes about 5 minutes but can take up to 30 minutes. After the placenta is delivered, a medicine may be given either by IV or injection to help contract the uterus and control bleeding. If you are planning to breastfeed, you can try to do so now. After you deliver the placenta, your uterus should contract and get very firm. If your uterus does not remain firm, your health care provider will massage it. This is important because the contraction of the uterus helps cut off bleeding at the site where the placenta was attached to your uterus. If your uterus does not contract properly and stay firm, you may continue to bleed heavily. If there is a lot of bleeding, medicines may be given to contract the uterus and stop the bleeding.  Document Released: 02/16/2008 Document Revised: 09/23/2013 Document Reviewed: 10/28/2012 [facility] Patient Information 2015 [place], [place]. This information is not intended to replace advice given to you by your health care provider. Make sure you discuss any questions you have with your  health care provider.

## 2015-01-06 ENCOUNTER — Ambulatory Visit (INDEPENDENT_AMBULATORY_CARE_PROVIDER_SITE_OTHER): Payer: Medicaid Other | Admitting: Obstetrics & Gynecology

## 2015-01-06 VITALS — BP 117/73 | HR 78 | Temp 98.0°F | Wt 186.1 lb

## 2015-01-06 DIAGNOSIS — Z3493 Encounter for supervision of normal pregnancy, unspecified, third trimester: Secondary | ICD-10-CM

## 2015-01-06 DIAGNOSIS — Z3483 Encounter for supervision of other normal pregnancy, third trimester: Secondary | ICD-10-CM | POA: Diagnosis not present

## 2015-01-06 LAB — POCT URINALYSIS DIP (DEVICE)
BILIRUBIN URINE: NEGATIVE
GLUCOSE, UA: NEGATIVE mg/dL
HGB URINE DIPSTICK: NEGATIVE
Ketones, ur: NEGATIVE mg/dL
Nitrite: NEGATIVE
Protein, ur: NEGATIVE mg/dL
SPECIFIC GRAVITY, URINE: 1.015 (ref 1.005–1.030)
Urobilinogen, UA: 0.2 mg/dL (ref 0.0–1.0)
pH: 6.5 (ref 5.0–8.0)

## 2015-01-06 NOTE — Patient Instructions (Signed)
Return to clinic for any obstetric concerns or go to MAU for evaluation  

## 2015-01-06 NOTE — Progress Notes (Signed)
Subjective:  Ariana Young is a 29 y.o. G1P0 at [redacted]w[redacted]d being seen today for ongoing prenatal care.  Patient reports occasional contractions, and was seen in MAU yesterday.  Was told she was 1 cm dilated. .  Contractions: Irregular.  Vag. Bleeding: None. Movement: Present. Denies leaking of fluid.   The following portions of the patient's history were reviewed and updated as appropriate: allergies, current medications, past family history, past medical history, past social history, past surgical history and problem list.   Objective:   Filed Vitals:   01/06/15 1545  BP: 117/73  Pulse: 78  Temp: 98 F (36.7 C)  Weight: 186 lb 1.6 oz (84.414 kg)    Fetal Status: Fetal Heart Rate (bpm): 135 Fundal Height: 39 cm Movement: Present     General:  Alert, oriented and cooperative. Patient is in no acute distress.  Skin: Skin is warm and dry. No rash noted.   Cardiovascular: Normal heart rate noted  Respiratory: Normal respiratory effort, no problems with respiration noted  Abdomen: Soft, gravid, appropriate for gestational age. Pain/Pressure: Present     Pelvic: Vag. Bleeding: None Vag D/C Character: White   Cervical exam deferred        Extremities: Normal range of motion.  Edema: Trace  Mental Status: Normal mood and affect. Normal behavior. Normal judgment and thought content.   Urinalysis: Urine Protein: Negative Urine Glucose: Negative  Assessment and Plan:  Pregnancy: G1P0 at [redacted]w[redacted]d  1. Supervision of normal pregnancy in third trimester Term labor symptoms and general obstetric precautions including but not limited to vaginal bleeding, contractions, leaking of fluid and fetal movement were reviewed in detail with the patient. Please refer to After Visit Summary for other counseling recommendations.  Return in about 7 days (around 01/13/2015) for OB visit and NST; 01/16/15 for NST and AFI for post dates.   Osborne Oman, MD

## 2015-01-12 ENCOUNTER — Other Ambulatory Visit: Payer: Medicaid Other

## 2015-01-12 ENCOUNTER — Encounter (HOSPITAL_COMMUNITY): Payer: Self-pay | Admitting: *Deleted

## 2015-01-12 ENCOUNTER — Inpatient Hospital Stay (HOSPITAL_COMMUNITY): Payer: Medicaid Other | Admitting: Anesthesiology

## 2015-01-12 ENCOUNTER — Inpatient Hospital Stay (HOSPITAL_COMMUNITY)
Admission: AD | Admit: 2015-01-12 | Discharge: 2015-01-14 | DRG: 983 | Disposition: A | Discharge status: Home / Self Care | Payer: Medicaid Other | Source: Ambulatory Visit | Attending: Family Medicine | Admitting: Family Medicine

## 2015-01-12 DIAGNOSIS — O9952 Diseases of the respiratory system complicating childbirth: Secondary | ICD-10-CM | POA: Diagnosis present

## 2015-01-12 DIAGNOSIS — J45909 Unspecified asthma, uncomplicated: Secondary | ICD-10-CM | POA: Diagnosis present

## 2015-01-12 DIAGNOSIS — Z3A40 40 weeks gestation of pregnancy: Secondary | ICD-10-CM | POA: Diagnosis not present

## 2015-01-12 DIAGNOSIS — Z87891 Personal history of nicotine dependence: Secondary | ICD-10-CM

## 2015-01-12 DIAGNOSIS — O480 Post-term pregnancy: Principal | ICD-10-CM | POA: Diagnosis present

## 2015-01-12 LAB — CBC
HEMATOCRIT: 28.7 % — AB (ref 36.0–46.0)
Hemoglobin: 9.9 g/dL — ABNORMAL LOW (ref 12.0–15.0)
MCH: 32.6 pg (ref 26.0–34.0)
MCHC: 34.5 g/dL (ref 30.0–36.0)
MCV: 94.4 fL (ref 78.0–100.0)
Platelets: 184 10*3/uL (ref 150–400)
RBC: 3.04 MIL/uL — ABNORMAL LOW (ref 3.87–5.11)
RDW: 13.8 % (ref 11.5–15.5)
WBC: 8.3 10*3/uL (ref 4.0–10.5)

## 2015-01-12 LAB — ABO/RH: ABO/RH(D): O POS

## 2015-01-12 LAB — POCT FERN TEST: POCT FERN TEST: POSITIVE

## 2015-01-12 LAB — TYPE AND SCREEN
ABO/RH(D): O POS
Antibody Screen: NEGATIVE

## 2015-01-12 MED ORDER — ONDANSETRON HCL 4 MG/2ML IJ SOLN
4.0000 mg | Freq: Four times a day (QID) | INTRAMUSCULAR | Status: DC | PRN
Start: 2015-01-12 — End: 2015-01-12
  Administered 2015-01-12: 4 mg via INTRAVENOUS
  Filled 2015-01-12: qty 2

## 2015-01-12 MED ORDER — PHENYLEPHRINE 40 MCG/ML (10ML) SYRINGE FOR IV PUSH (FOR BLOOD PRESSURE SUPPORT)
80.0000 ug | PREFILLED_SYRINGE | INTRAVENOUS | Status: DC | PRN
  Filled 2015-01-12: qty 20
  Filled 2015-01-12: qty 2

## 2015-01-12 MED ORDER — OXYCODONE-ACETAMINOPHEN 5-325 MG PO TABS: 1.0000 | ORAL_TABLET | ORAL | Status: DC | PRN

## 2015-01-12 MED ORDER — ONDANSETRON HCL 4 MG PO TABS: 4.0000 mg | ORAL_TABLET | ORAL | Status: DC | PRN

## 2015-01-12 MED ORDER — LIDOCAINE HCL (PF) 1 % IJ SOLN
INTRAMUSCULAR | Status: DC | PRN
  Administered 2015-01-12 (×2): 4 mL

## 2015-01-12 MED ORDER — LACTATED RINGERS IV SOLN
500.0000 mL | INTRAVENOUS | Status: DC | PRN
  Administered 2015-01-12 (×2): 1000 mL via INTRAVENOUS

## 2015-01-12 MED ORDER — DIBUCAINE 1 % RE OINT
1.0000 "application " | TOPICAL_OINTMENT | RECTAL | Status: DC | PRN
  Administered 2015-01-14: 1 via RECTAL
  Filled 2015-01-12: qty 28

## 2015-01-12 MED ORDER — OXYCODONE-ACETAMINOPHEN 5-325 MG PO TABS
2.0000 | ORAL_TABLET | ORAL | Status: DC | PRN
Start: 2015-01-12 — End: 2015-01-14
  Administered 2015-01-13: 2 via ORAL
  Filled 2015-01-12: qty 2

## 2015-01-12 MED ORDER — ZOLPIDEM TARTRATE 5 MG PO TABS: 5.0000 mg | ORAL_TABLET | Freq: Every evening | ORAL | Status: DC | PRN

## 2015-01-12 MED ORDER — DIPHENHYDRAMINE HCL 50 MG/ML IJ SOLN: 12.5000 mg | INTRAMUSCULAR | Status: DC | PRN

## 2015-01-12 MED ORDER — OXYTOCIN BOLUS FROM INFUSION: 500.0000 mL | INTRAVENOUS | Status: DC

## 2015-01-12 MED ORDER — TETANUS-DIPHTH-ACELL PERTUSSIS 5-2.5-18.5 LF-MCG/0.5 IM SUSP: 0.5000 mL | Freq: Once | INTRAMUSCULAR | Status: DC

## 2015-01-12 MED ORDER — FLEET ENEMA 7-19 GM/118ML RE ENEM: 1.0000 | ENEMA | Freq: Once | RECTAL | Status: DC

## 2015-01-12 MED ORDER — BENZOCAINE-MENTHOL 20-0.5 % EX AERO
1.0000 "application " | INHALATION_SPRAY | CUTANEOUS | Status: DC | PRN
  Administered 2015-01-13 – 2015-01-14 (×2): 1 via TOPICAL
  Filled 2015-01-12 (×2): qty 56

## 2015-01-12 MED ORDER — LACTATED RINGERS IV SOLN
INTRAVENOUS | Status: DC
  Administered 2015-01-12: 16:00:00 via INTRAVENOUS

## 2015-01-12 MED ORDER — PRENATAL MULTIVITAMIN CH
1.0000 | ORAL_TABLET | Freq: Every day | ORAL | Status: DC
  Administered 2015-01-13 – 2015-01-14 (×2): 1 via ORAL
  Filled 2015-01-12 (×2): qty 1

## 2015-01-12 MED ORDER — CITRIC ACID-SODIUM CITRATE 334-500 MG/5ML PO SOLN: 30.0000 mL | ORAL | Status: DC | PRN

## 2015-01-12 MED ORDER — OXYTOCIN 40 UNITS IN LACTATED RINGERS INFUSION - SIMPLE MED
1.0000 m[IU]/min | INTRAVENOUS | Status: DC
  Administered 2015-01-12: 2 m[IU]/min via INTRAVENOUS
  Filled 2015-01-12: qty 1000

## 2015-01-12 MED ORDER — FENTANYL 2.5 MCG/ML BUPIVACAINE 1/10 % EPIDURAL INFUSION (WH - ANES)
14.0000 mL/h | INTRAMUSCULAR | Status: DC | PRN
  Administered 2015-01-12 (×2): 14 mL/h via EPIDURAL
  Filled 2015-01-12 (×2): qty 125

## 2015-01-12 MED ORDER — ONDANSETRON HCL 4 MG/2ML IJ SOLN: 4.0000 mg | INTRAMUSCULAR | Status: DC | PRN

## 2015-01-12 MED ORDER — TERBUTALINE SULFATE 1 MG/ML IJ SOLN
0.2500 mg | Freq: Once | INTRAMUSCULAR | Status: DC | PRN
  Filled 2015-01-12: qty 1

## 2015-01-12 MED ORDER — WITCH HAZEL-GLYCERIN EX PADS
1.0000 "application " | MEDICATED_PAD | CUTANEOUS | Status: DC | PRN
  Administered 2015-01-14: 1 via TOPICAL

## 2015-01-12 MED ORDER — OXYTOCIN 40 UNITS IN LACTATED RINGERS INFUSION - SIMPLE MED: 62.5000 mL/h | INTRAVENOUS | Status: DC

## 2015-01-12 MED ORDER — ACETAMINOPHEN 325 MG PO TABS: 650.0000 mg | ORAL_TABLET | ORAL | Status: DC | PRN

## 2015-01-12 MED ORDER — SENNOSIDES-DOCUSATE SODIUM 8.6-50 MG PO TABS
2.0000 | ORAL_TABLET | ORAL | Status: DC
  Administered 2015-01-13 (×2): 2 via ORAL
  Filled 2015-01-12 (×2): qty 2

## 2015-01-12 MED ORDER — SIMETHICONE 80 MG PO CHEW: 80.0000 mg | CHEWABLE_TABLET | ORAL | Status: DC | PRN

## 2015-01-12 MED ORDER — OXYCODONE-ACETAMINOPHEN 5-325 MG PO TABS: 2.0000 | ORAL_TABLET | ORAL | Status: DC | PRN

## 2015-01-12 MED ORDER — LANOLIN HYDROUS EX OINT: TOPICAL_OINTMENT | CUTANEOUS | Status: DC | PRN

## 2015-01-12 MED ORDER — EPHEDRINE 5 MG/ML INJ
10.0000 mg | INTRAVENOUS | Status: DC | PRN
  Filled 2015-01-12: qty 2

## 2015-01-12 MED ORDER — IBUPROFEN 600 MG PO TABS
600.0000 mg | ORAL_TABLET | Freq: Four times a day (QID) | ORAL | Status: DC
  Administered 2015-01-13 – 2015-01-14 (×7): 600 mg via ORAL
  Filled 2015-01-12 (×7): qty 1

## 2015-01-12 MED ORDER — LIDOCAINE HCL (PF) 1 % IJ SOLN
30.0000 mL | INTRAMUSCULAR | Status: DC | PRN
  Filled 2015-01-12: qty 30

## 2015-01-12 MED ORDER — OXYCODONE-ACETAMINOPHEN 5-325 MG PO TABS
1.0000 | ORAL_TABLET | ORAL | Status: DC | PRN
  Administered 2015-01-13 – 2015-01-14 (×5): 1 via ORAL
  Filled 2015-01-12 (×5): qty 1

## 2015-01-12 MED ORDER — DIPHENHYDRAMINE HCL 25 MG PO CAPS: 25.0000 mg | ORAL_CAPSULE | Freq: Four times a day (QID) | ORAL | Status: DC | PRN

## 2015-01-12 NOTE — MAU Provider Note (Signed)
S:  [name] is a 29 y.o. female G1P0 at [redacted]w[redacted]d presenting to MAU with possible ROM. She started leaking fluid this morning; the fluid is clear with some quite pieces.   + fetal movement Denies vaginal bleeding + contractions; irregular at time.  Her prenatal course has been uncomplicated.   GBS negative.    O:  GENERAL: Well-developed, well-nourished female in no acute distress.  LUNGS: Effort normal SKIN: Warm, dry and without erythema ABDOMEN: soft, non tender. Fundus soft between contractions PSYCH: Normal mood and affect  Filed Vitals:   01/12/15 0915  BP: 137/87  Pulse: 72  Temp: 97.5 F (36.4 C)  TempSrc: Oral  Resp: 16   Speculum exam: Vagina - Small- moderate amount of clear fluid with brown particles pooling in the vaginal canal.  Cervix - No contact bleeding, no active bleeding.  Bimanual exam: Dilation: 3 Effacement (%): 70 Cervical Position: Middle Station: -2 Presentation: Vertex Exam by:: Benito Mccreedy, RN   Maryann Alar collected.  Chaperone present for exam.  A:  Term ROM GBS negative    P:  Admit to labor and delivery per Dr. Nehemiah Settle.    Lezlie Lye, NP 01/12/2015 10:06 AM

## 2015-01-12 NOTE — MAU Note (Signed)
Per HKoran, RN charge, pt to go to room 160.

## 2015-01-12 NOTE — H&P (Signed)
S:  [name] is a 29 y.o. female G1P0 at [redacted]w[redacted]d presenting to MAU with possible ROM. She started leaking fluid this morning; the fluid is clear with some quite pieces.   + fetal movement Denies vaginal bleeding + contractions; irregular at time.  Her prenatal course has been uncomplicated.   GBS negative.    O:  GENERAL: Well-developed, well-nourished female in no acute distress.  LUNGS: Effort normal SKIN: Warm, dry and without erythema ABDOMEN: soft, non tender. Fundus soft between contractions PSYCH: Normal mood and affect  Filed Vitals:   01/12/15 0915  BP: 137/87  Pulse: 72  Temp: 97.5 F (36.4 C)  TempSrc: Oral  Resp: 16   Speculum exam: Vagina - Small- moderate amount of clear fluid with brown particles pooling in the vaginal canal.  Cervix - No contact bleeding, no active bleeding.  Bimanual exam: Dilation: 3 Effacement (%): 70 Cervical Position: Middle Station: -2 Presentation: Vertex Exam by:: Benito Mccreedy, RN   Maryann Alar collected.  Chaperone present for exam.  A:  Term ROM GBS negative    P:  Admit to labor and delivery per Dr. Nehemiah Settle.    Lezlie Lye, NP 01/12/2015 10:06 AM

## 2015-01-12 NOTE — Progress Notes (Signed)
Baby was born at 2056, nucal x 1 loose, Apgars 8 and 9  doing skin to skin . At 2011 baby was dusky and was taken to the warmer. Heart rate was 120 and respiration was 70. PPV was given, decl, suctioned. Code APGAR was called. Baby started crying. Heart rate was 160, respiration 65.

## 2015-01-12 NOTE — Anesthesia Procedure Notes (Signed)
Epidural Patient location during procedure: OB  Staffing Anesthesiologist: Lethia Donlon Performed by: anesthesiologist   Preanesthetic Checklist Completed: patient identified, site marked, surgical consent, pre-op evaluation, timeout performed, IV checked, risks and benefits discussed and monitors and equipment checked  Epidural Patient position: sitting Prep: site prepped and draped and DuraPrep Patient monitoring: continuous pulse ox and blood pressure Approach: midline Location: L3-L4 Injection technique: LOR saline  Needle:  Needle type: Tuohy  Needle gauge: 17 G Needle length: 9 cm and 9 Needle insertion depth: 6 cm Catheter type: closed end flexible Catheter size: 19 Gauge Catheter at skin depth: 10 cm Test dose: negative  Assessment Events: blood not aspirated, injection not painful, no injection resistance, negative IV test and no paresthesia  Additional Notes Patient identified. Risks/Benefits/Options discussed with patient including but not limited to bleeding, infection, nerve damage, paralysis, failed block, incomplete pain control, headache, blood pressure changes, nausea, vomiting, reactions to medication both or allergic, itching and postpartum back pain. Confirmed with bedside nurse the patient's most recent platelet count. Confirmed with patient that they are not currently taking any anticoagulation, have any bleeding history or any family history of bleeding disorders. Patient expressed understanding and wished to proceed. All questions were answered. Sterile technique was used throughout the entire procedure. Please see nursing notes for vital signs. Test dose was given through epidural catheter and negative prior to continuing to dose epidural or start infusion. Warning signs of high block given to the patient including shortness of breath, tingling/numbness in hands, complete motor block, or any concerning symptoms with instructions to call for help. Patient was  given instructions on fall risk and not to get out of bed. All questions and concerns addressed with instructions to call with any issues or inadequate analgesia.

## 2015-01-12 NOTE — MAU Note (Signed)
Pt. States she began having contractions last night that woke her. States her contractions became stronger this am around 8:15am and that is when she called EMS. Last appointment was last week and was to be seen today at 4pm. Pt. States baby is moving well. Unsure if leaking. Denies bleeding.

## 2015-01-12 NOTE — Anesthesia Preprocedure Evaluation (Signed)
Anesthesia Evaluation  Patient identified by MRN, date of birth, ID band Patient awake    Reviewed: Allergy & Precautions, NPO status , Patient's Chart, lab work & pertinent test results  History of Anesthesia Complications Negative for: history of anesthetic complications  Airway Mallampati: II  TM Distance: >3 FB Neck ROM: Full    Dental no notable dental hx. (+) Dental Advisory Given   Pulmonary asthma , former smoker,  breath sounds clear to auscultation  Pulmonary exam normal       Cardiovascular negative cardio ROS Normal cardiovascular examRhythm:Regular Rate:Normal     Neuro/Psych negative neurological ROS  negative psych ROS   GI/Hepatic negative GI ROS, Neg liver ROS,   Endo/Other  negative endocrine ROS  Renal/GU negative Renal ROS  negative genitourinary   Musculoskeletal negative musculoskeletal ROS (+)   Abdominal   Peds negative pediatric ROS (+)  Hematology negative hematology ROS (+)   Anesthesia Other Findings   Reproductive/Obstetrics (+) Pregnancy                             Anesthesia Physical Anesthesia Plan  ASA: II  Anesthesia Plan: Epidural   Post-op Pain Management:    Induction:   Airway Management Planned:   Additional Equipment:   Intra-op Plan:   Post-operative Plan:   Informed Consent: I have reviewed the patients History and Physical, chart, labs and discussed the procedure including the risks, benefits and alternatives for the proposed anesthesia with the patient or authorized representative who has indicated his/her understanding and acceptance.   Dental advisory given  Plan Discussed with: CRNA  Anesthesia Plan Comments:         Anesthesia Quick Evaluation

## 2015-01-13 LAB — CCBB MATERNAL DONOR DRAW

## 2015-01-13 LAB — RPR: RPR Ser Ql: NONREACTIVE

## 2015-01-13 LAB — HEPATITIS B SURFACE ANTIGEN: Hepatitis B Surface Ag: NEGATIVE

## 2015-01-13 MED ORDER — PNEUMOCOCCAL VAC POLYVALENT 25 MCG/0.5ML IJ INJ
0.5000 mL | INJECTION | INTRAMUSCULAR | Status: AC
  Administered 2015-01-14: 0.5 mL via INTRAMUSCULAR
  Filled 2015-01-13: qty 0.5

## 2015-01-13 NOTE — Anesthesia Postprocedure Evaluation (Signed)
  Anesthesia Post-op Note  Patient: Ariana Young  Procedure(s) Performed: * No procedures listed *  Patient Location: Mother/Baby  Anesthesia Type:Epidural  Level of Consciousness: awake, alert , oriented and patient cooperative  Airway and Oxygen Therapy: Patient Spontanous Breathing  Post-op Pain: mild  Post-op Assessment: Post-op Vital signs reviewed, Patient's Cardiovascular Status Stable, Respiratory Function Stable, Patent Airway, No signs of Nausea or vomiting, Adequate PO intake, Pain level controlled, No headache, No backache and Patient able to bend at knees              Post-op Vital Signs: Reviewed and stable  Last Vitals:  Filed Vitals:   01/13/15 0900  BP: 111/66  Pulse: 92  Temp: 36.5 C  Resp: 18    Complications: No apparent anesthesia complications

## 2015-01-13 NOTE — Progress Notes (Signed)
Post Partum Day 1 Subjective: no complaints, up ad lib, voiding and tolerating PO Breastfeeding well  Objective: Blood pressure 116/63, pulse 75, temperature 98 F (36.7 C), temperature source Oral, resp. rate 18, height 5\' 7"  (1.702 m), weight 187 lb 9.6 oz (85.095 kg), last menstrual period 04/12/2014, SpO2 99 %, unknown if currently breastfeeding.  Physical Exam:  General: alert, cooperative and no distress Lochia: appropriate Uterine Fundus: firm Incision: healing well DVT Evaluation: No evidence of DVT seen on physical exam.   Recent Labs  01/12/15 1243  HGB 9.9*  HCT 28.7*    Assessment/Plan: Plan for discharge tomorrow and Breastfeeding   LOS: 1 day   [facility] 01/13/2015, 6:35 AM

## 2015-01-13 NOTE — Progress Notes (Signed)
UR chart review completed.  

## 2015-01-13 NOTE — Clinical Social Work Maternal (Signed)
CLINICAL SOCIAL WORK MATERNAL/CHILD NOTE  Patient Details  Name: Ariana Young MRN: [id number] Date of Birth: [birth year]  Date:  01/13/2015  Clinical Social Worker Initiating Note:  Lucita Ferrara, LCSW Date/ Time Initiated:  01/13/15/1400     Child's Name:  Keene Breath    Legal Guardian:  Mother -- [name]  Need for Interpreter:  None   Date of Referral:  01/12/15     Reason for Referral:  History of depression/anxiety, Current Substance Use/Substance Use During Pregnancy    Referral Source:  [facility]   Address:  [address] [place], [place] [address]  Phone number:  [phone]   Household Members: Mother  Natural Supports (not living in the home):  Immediate Family, Extended Family   Professional Supports: None   Employment: Full-time   Type of Work:   Pharmacist, hospital  Education:    N/A  Museum/gallery curator Resources:  [employer]   Other Resources:  [facility]   Cultural/Religious Considerations Which May Impact Care:  None reported  Strengths:  Ability to meet basic needs , Lexicographer chosen , Home prepared for child    Risk Factors/Current Problems:   1)Substance Use: 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 Infant's UDS is negative and MDS is pending.  2) Situational depression/anxiety: MOB endorsed onset of depression and anxiety early in the pregnancy secondary to relational stress with FOB. MOB shared that she went to therapy, ended the relationship, and moved closer to her support system. She stated that due to these changes, she has noted no additional symptoms in past 1-2 months.   Cognitive State:  Able to Concentrate , Alert , Insightful , Goal Oriented , Linear Thinking    Mood/Affect:  Animated, Bright , Happy , Interested    CSW Assessment:  CSW received request for consult due to MOB presenting with a history of depression/anxiety and substance use during pregnancy.  MOB presented as easily engaged and receptive to the visit. She presented in a pleasant  mood and displayed a full range in affect.  MOB did not present with acute mental health symptoms.  MOB expressed feelings of excitement and happiness secondary to the birth of the infant. She shared that the infant is a "miracle" baby since she previously was informed that she would never be able to naturally conceive an infant. MOB discussed how she is eager to return home, and is looking forward to becoming a mother.  Per MOB, she moved to [facility] from [place] in June, and originally was overwhelmed with the move since she was going to be moving back in with her mother. She shared that it was difficult since she was used to living on her own, but is now feeling better about the change since it is only for the short term, she will have increased support postpartum, and is realizing that she is not any less of a woman just because she is living with her mother.  MOB confirmed that she has strong family support in [place] and that she intends to continue to utilize support postpartum.   Per MOB, onset of depression and anxiety occurred early in the pregnancy. She stated that it was situational secondary to the relationship with her ex-boyfriend (who is not the FOB).  She shared that she was crying all the time, was anxious, and overwhelmed as she was focused on the stress in conjunction with the pregnancy hormones.  MOB discussed how she chose to reach out to a counselor while living in [place] in order  to receive increased support. MOB stated that since she has moved to [facility] she has not felt the same mental health symptoms. She shared a belief that being near her family has been helpful since she no longer feels alone. MOB also discussed benefit of ending the relationship with her ex-boyfriend and having a slower pace of life (in comparison to [place]).  MOB shared that she has recently been able to focus on the happiness she feels instead of the pregnancy since she is no longer worried about her previous  relational stress.  MOB presented with previous knowledge about perinatal mood and anxiety disorders since she has a friend who experienced symptoms. MOB was receptive to additional education by CSW, and agreed to contact her medical provider if needs arise.   MOB reported THC use early in the pregnancy. She stated that she continued to use THC once she learned that she was pregnant to assist with nausea.  Per MOB, last THC use was in the second trimester. MOB informed of hospital drug screen policy, and she denied questions or concerns related to the collection of infant's urine and meconium. MOB aware that CPS will be contacted if there is a positive drug screen, and she voiced no concerns about the procedure.  MOB expressed appreciation for the visit, and she agreed to contact CSW if needs arise during the admission.   CSW Plan/Description:   1)Patient/Family Education: Perinatal mood and anxiety disorders and resources  2)CSW to monitor infant's drug screens and will make a CPS report if positive.  3)No Further Intervention Required/No Barriers to Discharge    Sharyl Nimrod 01/13/2015, 2:52 PM

## 2015-01-13 NOTE — Lactation Note (Signed)
This note was copied from the chart of Ariana Young. Lactation Consultation Note  Patient Name: Ariana Yanci Bachtell CVKFM'M Date: 01/13/2015 Reason for consult: Initial assessment  88 hours old , and has been to the breast several times, 10-20 mins. Baby awake and assisted with latch in football position , hand expressed 1st ,  Several drops of colostrum noted dribbled in baby's mouth. Baby opened wide  And latched with depth and fed 7 mins , multiply swallows, increased with breast compressions.  Baby released from the breast , nipple well rounded when baby came off. Per mom comfortable  With feeding. Baby content. HNV , HNS  Mother informed of post-discharge support and given phone number to the lactation department, including services for phone call assistance; out-patient appointments; and breastfeeding support group. List of other breastfeeding resources in the community given in the handout. Encouraged mother to call for problems or concerns related to breastfeeding.   Maternal Data Has patient been taught Hand Expression?: Yes  Feeding Feeding Type: Breast Fed Length of feed: 7 min (multiply swallows )  LATCH Score/Interventions Latch: Grasps breast easily, tongue down, lips flanged, rhythmical sucking. Intervention(s): Adjust position;Assist with latch;Breast massage;Breast compression  Audible Swallowing: Spontaneous and intermittent  Type of Nipple: Everted at rest and after stimulation  Comfort (Breast/Nipple): Soft / non-tender     Hold (Positioning): Assistance needed to correctly position infant at breast and maintain latch. Intervention(s): Breastfeeding basics reviewed  LATCH Score: 9  Lactation Tools Discussed/Used WIC Program: Yes (per  mom )   Consult Status Consult Status: Follow-up Date: 01/14/15 Follow-up type: In-patient    Myer Haff 01/13/2015, 4:51 PM

## 2015-01-14 LAB — RUBELLA SCREEN: RUBELLA: 1.85 {index} (ref >0.99)

## 2015-01-14 MED ORDER — NORETHINDRONE 0.35 MG PO TABS
1.0000 | ORAL_TABLET | Freq: Every day | ORAL | Status: DC
Start: 2015-01-28 — End: 2016-04-22

## 2015-01-14 MED ORDER — DOCUSATE SODIUM 100 MG PO CAPS: 100.0000 mg | ORAL_CAPSULE | Freq: Two times a day (BID) | ORAL | Status: DC | PRN

## 2015-01-14 MED ORDER — IBUPROFEN 600 MG PO TABS: 600.0000 mg | ORAL_TABLET | Freq: Four times a day (QID) | ORAL | Status: DC

## 2015-01-14 NOTE — Discharge Summary (Signed)
Obstetric Discharge Summary Reason for Admission: rupture of membranes Prenatal Procedures: ultrasound Intrapartum Procedures: spontaneous vaginal delivery Postpartum Procedures: none Complications-Operative and Postpartum: vaginal and periurethral laceration  Delivery Summary: First Stage: Labor onset: 0920 on 8-22 Augmentation : pitocin Analgesia /Anesthesia intrapartum: Epidural SROM at 0920  Second Stage: Complete dilation at 2026 Onset of pushing at 2040  Delivery of a viable infant female at 2056 by Ardean Larsen, SNM in OA position 1 loose nuchal cord Cord double clamped after cessation of pulsation, cut by grandmother Cord blood sample collected  Collection of cord blood donation: Yes  Infant pulse at delivery greater than 100 with well-flexed extremities and cry after stimulation.  Third Stage: Placenta delivered [facility] intact with 3 VC @ 2111 Placenta disposition: hospital  Uterine tone firm / bleeding mild   Periurethral laceration identified and vaginal pocket laceration repaired with 3-0 vicryl Est. Blood Loss (mL): 350  At 10 minutes post delivery baby became dusky and had secondary apnea. Baby immediately taken to warmer. 4 ml of fluid suctioned from trachea and PPV provided. Code APGAR called; neonatal team in the room. Infant stabilized and placed on mom's chest.   Hospital Course:  Active Problems:   Indication for care in labor or delivery   Postpartum care following vaginal delivery   Ariana Young is a 29 y.o. G1P1001 s/p NSVD.  Patient was admitted for ROM.  She has postpartum course that was uncomplicated including no problems with ambulating, PO intake, urination, pain, or bleeding. The pt feels ready to go home and  will be discharged with outpatient follow-up.   Today: No acute events overnight.  Pt denies problems with ambulating, voiding or po intake.  She denies nausea or vomiting.  Pain is well controlled.  She has had flatus. She has had  bowel movement.  Lochia Small.  Plan for birth control is oral progesterone-only contraceptive.  Method of Feeding: breast   Physical Exam:  General: alert, cooperative and no distress Lochia: appropriate Uterine Fundus: firm DVT Evaluation: No evidence of DVT seen on physical exam.  H/H: Lab Results  Component Value Date/Time   HGB 9.9* 01/12/2015 12:43 PM   HGB 10.8 07/01/2014   HCT 28.7* 01/12/2015 12:43 PM   HCT 31 07/01/2014    Discharge Diagnoses: Term Pregnancy-delivered  Discharge Information: Date: 01/14/2015 Activity: pelvic rest Diet: routine  Medications: PNV, Ibuprofen and Colace Breast feeding:  Yes Condition: stable Instructions: refer to handout Discharge to: home     Medication List    STOP taking these medications        hydrocortisone 2.5 % rectal cream  Commonly known as:  ANUSOL-HC     terconazole 0.4 % vaginal cream  Commonly known as:  TERAZOL 7      TAKE these medications        albuterol 108 (90 BASE) MCG/ACT inhaler  Commonly known as:  PROVENTIL HFA;VENTOLIN HFA  Inhale 1-2 puffs into the lungs every 4 (four) hours as needed for wheezing or shortness of breath.     calcium carbonate 500 MG chewable tablet  Commonly known as:  TUMS - dosed in mg elemental calcium  Chew 1-2 tablets by mouth daily as needed for indigestion or heartburn.     docusate sodium 100 MG capsule  Commonly known as:  COLACE  Take 1 capsule (100 mg total) by mouth 2 (two) times daily as needed.     EPIPEN 0.3 mg/0.3 mL Soaj injection  Generic drug:  EPINEPHrine  Inject 0.3 mg  into the muscle once as needed (for allergic reaction).     hydrocortisone 1 % ointment  Apply 1 application topically daily.     ibuprofen 600 MG tablet  Commonly known as:  ADVIL,MOTRIN  Take 1 tablet (600 mg total) by mouth every 6 (six) hours.     norethindrone 0.35 MG tablet  Commonly known as:  MICRONOR,CAMILA,ERRIN  Take 1 tablet (0.35 mg total) by mouth daily.  Start  taking on:  01/28/2015     prenatal multivitamin Tabs tablet  Take 1 tablet by mouth daily.       Follow-up Information    Schedule an appointment as soon as possible for a visit with [facility].   Specialty:  Obstetrics and Gynecology   Why:  for post-partum follow-up   Contact information:   [place], DO 01/14/2015, 7:59 AM PGY-2, [place]  OB fellow attestation Post Partum Day 2/Day of discharge I have seen and examined this patient and agree with above documentation in the resident's note.   Ariana Young is a 29 y.o. G1P1001 s/p NSVD.  Pt denies problems with ambulating, voiding or po intake. Pain is well controlled.  Plan for birth control is oral progesterone-only contraceptive.  Method of Feeding: Breast  PE:  BP 119/70 mmHg  Pulse 78  Temp(Src) 98 F (36.7 C) (Oral)  Resp 20  Ht 5\' 7"  (1.702 m)  Wt 187 lb 9.6 oz (85.095 kg)  BMI 29.38 kg/m2  SpO2 100%  LMP 04/12/2014  Breastfeeding? Unknown Fundus firm  Plan for discharge: 2  Caren Macadam, MD 8:01 AM

## 2015-01-14 NOTE — Lactation Note (Signed)
This note was copied from the chart of Ariana Inioluwa Boulay. Lactation Consultation Note  Patient Name: Ariana Young Date: 01/14/2015 Reason for consult: Follow-up assessment Baby 55 hour old and is for D/C with mom. Per mom the baby is latching so much better and isn't sucking on his upper lip  Like he was yesterday . Per mom baby recently fed. Presently baby lying in bedside crib Sucking on pacifier. LC discussed with mom in the 1st 3- 1/2 -4 weeks why it is recommended  Not to use it. Otherwise baby has been consistent at the breast and per mom hearing more swallows Per mom right nipple tender . LC assessed breast tissue with moms permission. No breakdown noted  and mom easily expressed milk. Reminded mom to use EBM liberally on nipples.  Sore nipple and engorgement prevention and tx reviewed . LC instructed mom on the use comfort gels. Per mom already has a pump at home. Mother informed of post-discharge support and given phone number to the lactation department, including  services for phone call assistance; out-patient appointments; and breastfeeding support group. List of other  breastfeeding resources in the community given in the handout. Encouraged mother to call for problems or  concerns related to breastfeeding.  Maternal Data    Feeding Feeding Type:  (per mom recently fed ) Length of feed: 20 min (per mom )  LATCH Score/Interventions Latch: Repeated attempts needed to sustain latch, nipple held in mouth throughout feeding, stimulation needed to elicit sucking reflex.  Audible Swallowing: A few with stimulation  Type of Nipple: Everted at rest and after stimulation  Comfort (Breast/Nipple): Soft / non-tender     Hold (Positioning): No assistance needed to correctly position infant at breast. Intervention(s): Breastfeeding basics reviewed  LATCH Score: 8  Lactation Tools Discussed/Used Pump Review: Milk Storage Initiated by:: MAI  Date initiated::  01/14/15   Consult Status Consult Status: Complete Date: 01/14/15    Myer Haff 01/14/2015, 12:39 PM

## 2015-01-14 NOTE — Discharge Instructions (Signed)

## 2015-01-15 ENCOUNTER — Other Ambulatory Visit: Payer: Medicaid Other

## 2015-01-29 ENCOUNTER — Encounter: Payer: Self-pay | Admitting: *Deleted

## 2015-01-29 NOTE — Progress Notes (Signed)
FMLA completed.

## 2015-02-25 ENCOUNTER — Ambulatory Visit (INDEPENDENT_AMBULATORY_CARE_PROVIDER_SITE_OTHER): Payer: Medicaid Other | Admitting: Obstetrics & Gynecology

## 2015-02-25 ENCOUNTER — Encounter: Payer: Self-pay | Admitting: Obstetrics & Gynecology

## 2015-02-25 DIAGNOSIS — Z3202 Encounter for pregnancy test, result negative: Secondary | ICD-10-CM | POA: Diagnosis not present

## 2015-02-25 DIAGNOSIS — B9689 Other specified bacterial agents as the cause of diseases classified elsewhere: Secondary | ICD-10-CM

## 2015-02-25 DIAGNOSIS — N760 Acute vaginitis: Secondary | ICD-10-CM

## 2015-02-25 DIAGNOSIS — N898 Other specified noninflammatory disorders of vagina: Secondary | ICD-10-CM

## 2015-02-25 DIAGNOSIS — Z3042 Encounter for surveillance of injectable contraceptive: Secondary | ICD-10-CM

## 2015-02-25 LAB — POCT PREGNANCY, URINE: PREG TEST UR: NEGATIVE

## 2015-02-25 MED ORDER — MEDROXYPROGESTERONE ACETATE 150 MG/ML IM SUSP
150.0000 mg | INTRAMUSCULAR | Status: DC
  Administered 2015-02-25 – 2015-05-20 (×2): 150 mg via INTRAMUSCULAR

## 2015-02-25 NOTE — Progress Notes (Signed)
Patient ID: Ariana Young, female   DOB: December 04, 1985, 29 y.o.   MRN: [id number] Subjective:     Ariana Young is a 29 y.o. G63P1001 female who presents for a postpartum visit. She is 6 weeks postpartum following a spontaneous vaginal delivery. I have fully reviewed the prenatal and intrapartum course. The delivery was at 40 gestational weeks. Outcome: spontaneous vaginal delivery. Anesthesia: epidural. Postpartum course has been uncomplicated. Baby's course has been uncomplicated. Baby is feeding by Breast/Bottle: Ariana Young. Bleeding no bleeding. Bowel function is normal. Bladder function is normal. Patient is not sexually active. Contraception method is Depo-Provera injections. Postpartum depression screening: negative.  Patient reports having some white discharge, nonpruritic, feels that she may have a yeast infection.  The following portions of the patient's history were reviewed and updated as appropriate: allergies, current medications, past family history, past medical history, past social history, past surgical history and problem list.  Normal pap smear in 03/2014.  Review of Systems Pertinent items noted in HPI and remainder of comprehensive ROS otherwise negative.   Objective:    BP 141/100 mmHg  Pulse 87  Temp(Src) 98.3 F (36.8 C)  Wt 174 lb (78.926 kg)  Breastfeeding? Yes  General:  alert and no distress   Breasts:  inspection negative, no nipple discharge or bleeding, no masses or nodularity palpable  Lungs: clear to auscultation bilaterally  Heart:  regular rate and rhythm  Abdomen: soft, non-tender; bowel sounds normal; no masses,  no organomegaly   Vulva:  normal  Vagina: normal vagina and white discharge seen, wet prep obtained  Cervix:  multiparous appearance  Corpus: normal size, contour, position, consistency, mobility, non-tender  Adnexa:  normal adnexa and no mass, fullness, tenderness  Rectal Exam: Not performed.        Assessment:   Normal postpartum exam. Pap  smear not done at today's visit.   Plan:    1. Contraception: Depo-Provera injections 2. Elevated BP; patient will continue to monitor. If persists, will need PCP referral 3. Follow up in: 3 months for Depo Provera or as needed.    Verita Schneiders, MD, [place] Attending Obstetrician & Gynecologist, [place] [facility] ([place])

## 2015-02-25 NOTE — Patient Instructions (Signed)
Return to clinic for any scheduled appointments or for any gynecologic concerns as needed.   

## 2015-02-26 LAB — WET PREP, GENITAL
TRICH WET PREP: NONE SEEN
YEAST WET PREP: NONE SEEN

## 2015-02-26 MED ORDER — METRONIDAZOLE 500 MG PO TABS: 500.0000 mg | ORAL_TABLET | Freq: Two times a day (BID) | ORAL | Status: DC

## 2015-02-26 NOTE — Addendum Note (Signed)
Addended by: Verita Schneiders A on: 02/26/2015 08:08 AM   Modules accepted: Orders

## 2015-02-27 ENCOUNTER — Telehealth: Payer: Self-pay | Admitting: *Deleted

## 2015-02-27 NOTE — Telephone Encounter (Signed)
Per Dr. Harolyn Rutherford wet prep shows BV - flagy prescribed.   Called [facility] and notified her of BV and prescription sent to her pharmacy. Questions answered.

## 2015-03-26 ENCOUNTER — Encounter: Payer: Self-pay | Admitting: *Deleted

## 2015-04-09 ENCOUNTER — Encounter (HOSPITAL_COMMUNITY): Payer: Self-pay

## 2015-04-09 ENCOUNTER — Emergency Department (INDEPENDENT_AMBULATORY_CARE_PROVIDER_SITE_OTHER)
Admission: EM | Admit: 2015-04-09 | Discharge: 2015-04-09 | Disposition: A | Discharge status: Home / Self Care | Payer: Self-pay | Source: Home / Self Care | Attending: Family Medicine | Admitting: Family Medicine

## 2015-04-09 DIAGNOSIS — J4521 Mild intermittent asthma with (acute) exacerbation: Secondary | ICD-10-CM

## 2015-04-09 MED ORDER — ALBUTEROL SULFATE (2.5 MG/3ML) 0.083% IN NEBU
2.5000 mg | INHALATION_SOLUTION | Freq: Once | RESPIRATORY_TRACT | Status: AC
  Administered 2015-04-09: 2.5 mg via RESPIRATORY_TRACT

## 2015-04-09 MED ORDER — ALBUTEROL SULFATE HFA 108 (90 BASE) MCG/ACT IN AERS: 2.0000 | INHALATION_SPRAY | RESPIRATORY_TRACT | Status: DC | PRN

## 2015-04-09 MED ORDER — PREDNISONE 20 MG PO TABS: ORAL_TABLET | ORAL | Status: DC

## 2015-04-09 MED ORDER — IPRATROPIUM BROMIDE 0.02 % IN SOLN
RESPIRATORY_TRACT | Status: AC
  Filled 2015-04-09: qty 2.5

## 2015-04-09 MED ORDER — ALBUTEROL SULFATE (2.5 MG/3ML) 0.083% IN NEBU
INHALATION_SOLUTION | RESPIRATORY_TRACT | Status: AC
  Filled 2015-04-09: qty 6

## 2015-04-09 MED ORDER — IPRATROPIUM BROMIDE 0.02 % IN SOLN
0.5000 mg | RESPIRATORY_TRACT | Status: AC
  Administered 2015-04-09: 0.5 mg via RESPIRATORY_TRACT

## 2015-04-09 NOTE — Discharge Instructions (Signed)
Asthma Attack Prevention °While you may not be able to control the fact that you have asthma, you can take actions to prevent asthma attacks. The best way to prevent asthma attacks is to maintain good control of your asthma. You can achieve this by: °· Taking your medicines as directed. °· Avoiding things that can irritate your airways or make your asthma symptoms worse (asthma triggers). °· Keeping track of how well your asthma is controlled and of any changes in your symptoms. °· Responding quickly to worsening asthma symptoms (asthma attack). °· Seeking emergency care when it is needed. °WHAT ARE SOME WAYS TO PREVENT AN ASTHMA ATTACK? °Have a Plan °Work with your health care provider to create a written plan for managing and treating your asthma attacks (asthma action plan). This plan includes: °· A list of your asthma triggers and how you can avoid them. °· Information on when medicines should be taken and when their dosages should be changed. °· The use of a device that measures how well your lungs are working (peak flow meter). °Monitor Your Asthma °Use your peak flow meter and record your results in a journal every day. A drop in your peak flow numbers on one or more days may indicate the start of an asthma attack. This can happen even before you start to feel symptoms. You can prevent an asthma attack from getting worse by following the steps in your asthma action plan. °Avoid Asthma Triggers °Work with your asthma health care provider to find out what your asthma triggers are. This can be done by: °· Allergy testing. °· Keeping a journal that notes when asthma attacks occur and the factors that may have contributed to them. °· Determining if there are other medical conditions that are making your asthma worse. °Once you have determined your asthma triggers, take steps to avoid them. This may include avoiding excessive or prolonged exposure to: °· Dust. Have someone dust and vacuum your home for you once or  twice a week. Using a high-efficiency particulate arrestance (HEPA) vacuum is best. °· Smoke. This includes campfire smoke, forest fire smoke, and secondhand smoke from tobacco products. °· Pet dander. Avoid contact with animals that you know you are allergic to. °· Allergens from trees, grasses or pollens. Avoid spending a lot of time outdoors when pollen counts are high, and on very windy days. °· Very cold, dry, or humid air. °· Mold. °· Foods that contain high amounts of sulfites. °· Strong odors. °· Outdoor air pollutants, such as engine exhaust. °· Indoor air pollutants, such as aerosol sprays and fumes from household cleaners. °· Household pests, including dust mites and cockroaches, and pest droppings. °· Certain medicines, including NSAIDs. Always talk to your health care provider before stopping or starting any new medicines. °Medicines °Take over-the-counter and prescription medicines only as told by your health care provider. Many asthma attacks can be prevented by carefully following your medicine schedule. Taking your medicines correctly is especially important when you cannot avoid certain asthma triggers. °Act Quickly °If an asthma attack does happen, acting quickly can decrease how severe it is and how long it lasts. Take these steps:  °· Pay attention to your symptoms. If you are coughing, wheezing, or having difficulty breathing, do not wait to see if your symptoms go away on their own. Follow your asthma action plan. °· If you have followed your asthma action plan and your symptoms are not improving, call your health care provider or seek immediate medical care   at the nearest hospital. °It is important to note how often you need to use your fast-acting rescue inhaler. If you are using your rescue inhaler more often, it may mean that your asthma is not under control. Adjusting your asthma treatment plan may help you to prevent future asthma attacks and help you to gain better control of your  condition. °HOW CAN I PREVENT AN ASTHMA ATTACK WHEN I EXERCISE? °Follow advice from your health care provider about whether you should use your fast-acting inhaler before exercising. Many people with asthma experience exercise-induced bronchoconstriction (EIB). This condition often worsens during vigorous exercise in cold, humid, or dry environments. Usually, people with EIB can stay very active by pre-treating with a fast-acting inhaler before exercising. °  °This information is not intended to replace advice given to you by your health care provider. Make sure you discuss any questions you have with your health care provider. °  °Document Released: 04/27/2009 Document Revised: 01/28/2015 Document Reviewed: 10/09/2014 °Elsevier Interactive Patient Education ©2016 [employer] Inc[employer] ° °Asthma, Adult °Asthma is a recurring condition in which the airways tighten and narrow. Asthma can make it difficult to breathe. It can cause coughing, wheezing, and shortness of breath. Asthma episodes, also called asthma attacks, range from minor to life-threatening. Asthma cannot be cured, but medicines and lifestyle changes can help control it. °CAUSES °Asthma is believed to be caused by inherited (genetic) and environmental factors, but its exact cause is unknown. Asthma may be triggered by allergens, lung infections, or irritants in the air. Asthma triggers are different for each person. Common triggers include:  °· Animal dander. °· Dust mites. °· Cockroaches. °· Pollen from trees or grass. °· Mold. °· Smoke. °· Air pollutants such as dust, household cleaners, hair sprays, aerosol sprays, paint fumes, strong chemicals, or strong odors. °· Cold air, weather changes, and winds (which increase molds and pollens in the air). °· Strong emotional expressions such as crying or laughing hard. °· Stress. °· Certain medicines (such as aspirin) or types of drugs (such as beta-blockers). °· Sulfites in foods and drinks. Foods and drinks that  may contain sulfites include dried fruit, potato chips, and sparkling grape juice. °· Infections or inflammatory conditions such as the flu, a cold, or an inflammation of the nasal membranes (rhinitis). °· Gastroesophageal reflux disease (GERD). °· Exercise or strenuous activity. °SYMPTOMS °Symptoms may occur immediately after asthma is triggered or many hours later. Symptoms include: °· Wheezing. °· Excessive nighttime or early morning coughing. °· Frequent or severe coughing with a common cold. °· Chest tightness. °· Shortness of breath. °DIAGNOSIS  °The diagnosis of asthma is made by a review of your medical history and a physical exam. Tests may also be performed. These may include: °· Lung function studies. These tests show how much air you breathe in and out. °· Allergy tests. °· Imaging tests such as X-rays. °TREATMENT  °Asthma cannot be cured, but it can usually be controlled. Treatment involves identifying and avoiding your asthma triggers. It also involves medicines. There are 2 classes of medicine used for asthma treatment:  °· Controller medicines. These prevent asthma symptoms from occurring. They are usually taken every day. °· Reliever or rescue medicines. These quickly relieve asthma symptoms. They are used as needed and provide short-term relief. °Your health care provider will help you create an asthma action plan. An asthma action plan is a written plan for managing and treating your asthma attacks. It includes a list of your asthma triggers and how they   may be avoided. It also includes information on when medicines should be taken and when their dosage should be changed. An action plan may also involve the use of a device called a peak flow meter. A peak flow meter measures how well the lungs are working. It helps you monitor your condition. °HOME CARE INSTRUCTIONS  °· Take medicines only as directed by your health care provider. Speak with your health care provider if you have questions about  how or when to take the medicines. °· Use a peak flow meter as directed by your health care provider. Record and keep track of readings. °· Understand and use the action plan to help minimize or stop an asthma attack without needing to seek medical care. °· Control your home environment in the following ways to help prevent asthma attacks: °¨ Do not smoke. Avoid being exposed to secondhand smoke. °¨ Change your heating and air conditioning filter regularly. °¨ Limit your use of fireplaces and wood stoves. °¨ Get rid of pests (such as roaches and mice) and their droppings. °¨ Throw away plants if you see mold on them. °¨ Clean your floors and dust regularly. Use unscented cleaning products. °¨ Try to have someone else vacuum for you regularly. Stay out of rooms while they are being vacuumed and for a short while afterward. If you vacuum, use a dust mask from a hardware store, a double-layered or microfilter vacuum cleaner bag, or a vacuum cleaner with a HEPA filter. °¨ Replace carpet with wood, tile, or vinyl flooring. Carpet can trap dander and dust. °¨ Use allergy-proof pillows, mattress covers, and box spring covers. °¨ Wash bed sheets and blankets every week in hot water and dry them in a dryer. °¨ Use blankets that are made of polyester or cotton. °¨ Clean bathrooms and kitchens with bleach. If possible, have someone repaint the walls in these rooms with mold-resistant paint. Keep out of the rooms that are being cleaned and painted. °¨ Wash hands frequently. °SEEK MEDICAL CARE IF:  °· You have wheezing, shortness of breath, or a cough even if taking medicine to prevent attacks. °· The colored mucus you cough up (sputum) is thicker than usual. °· Your sputum changes from clear or white to yellow, green, gray, or bloody. °· You have any problems that may be related to the medicines you are taking (such as a rash, itching, swelling, or trouble breathing). °· You are using a reliever medicine more than 2-3 times per  week. °· Your peak flow is still at 50-79% of your personal best after following your action plan for 1 hour. °· You have a fever. °SEEK IMMEDIATE MEDICAL CARE IF:  °· You seem to be getting worse and are unresponsive to treatment during an asthma attack. °· You are short of breath even at rest. °· You get short of breath when doing very little physical activity. °· You have difficulty eating, drinking, or talking due to asthma symptoms. °· You develop chest pain. °· You develop a fast heartbeat. °· You have a bluish color to your lips or fingernails. °· You are light-headed, dizzy, or faint. °· Your peak flow is less than 50% of your personal best. °  °This information is not intended to replace advice given to you by your health care provider. Make sure you discuss any questions you have with your health care provider. °  °Document Released: 05/09/2005 Document Revised: 01/28/2015 Document Reviewed: 12/06/2012 °Elsevier Interactive Patient Education ©2016 Elsevier Inc. ° °

## 2015-04-09 NOTE — ED Provider Notes (Signed)
CSN: [id number]     Arrival date & time 04/09/15  1653 History   First MD Initiated Contact with Patient 04/09/15 1730     Chief Complaint  Patient presents with  . Asthma   (Consider location/radiation/quality/duration/timing/severity/associated sxs/prior Treatment) HPI Comments: 29 year old female with a history of asthma started having cough in the past few days. She is also had some shortness of breath. She ran out of her albuterol HFA's a couple days ago. Bicycling hour and a half prior to arrival she developed increase in cough, shortness of breath and wheezing. She presented to the urgent care and evaluated by the nurse who initiated a DuoNeb nebulizer.  Patient is a 29 y.o. female presenting with asthma.  Asthma Associated symptoms include shortness of breath.    Past Medical History  Diagnosis Date  . Ovarian cyst   . Asthma     uses inhaler as needed   Past Surgical History  Procedure Laterality Date  . Ovarian cyst surgery    . Duramoid      cyst re;moved in 09  . Wisdom tooth extraction     Family History  Problem Relation Age of Onset  . Diabetes Mother   . Hypertension Mother   . Heart disease Mother    Social History  Substance Use Topics  . Smoking status: Former Smoker -- 0.50 packs/day    Types: Cigarettes    Quit date: 05/30/2014  . Smokeless tobacco: Never Used  . Alcohol Use: No   OB History    Gravida Para Term Preterm AB TAB SAB Ectopic Multiple Living   1 1 1  0 0 0 0 0 0 1     Review of Systems  Constitutional: Positive for activity change. Negative for fever and fatigue.  HENT: Negative for congestion, sinus pressure and sore throat.   Respiratory: Positive for cough, shortness of breath and wheezing.   Cardiovascular: Negative.   Genitourinary: Negative.   Musculoskeletal: Negative.   Skin: Negative.   All other systems reviewed and are negative.   Allergies  Peanut-containing drug products and Peanuts  Home Medications   Prior  to Admission medications   Medication Sig Start Date End Date Taking? Authorizing Provider  albuterol (PROVENTIL HFA;VENTOLIN HFA) 108 (90 BASE) MCG/ACT inhaler Inhale 2 puffs into the lungs every 4 (four) hours as needed for wheezing or shortness of breath. 04/09/15   Janne Napoleon, NP  EPINEPHrine (EPIPEN) 0.3 mg/0.3 mL DEVI Inject 0.3 mg into the muscle once as needed (for allergic reaction).     Historical Provider, MD  hydrocortisone 1 % ointment Apply 1 application topically daily.    Historical Provider, MD  ibuprofen (ADVIL,MOTRIN) 600 MG tablet Take 1 tablet (600 mg total) by mouth every 6 (six) hours. 01/14/15   Katheren Shams, DO  metroNIDAZOLE (FLAGYL) 500 MG tablet Take 1 tablet (500 mg total) by mouth 2 (two) times daily. 02/26/15   Osborne Oman, MD  norethindrone (MICRONOR,CAMILA,ERRIN) 0.35 MG tablet Take 1 tablet (0.35 mg total) by mouth daily. Patient not taking: Reported on 02/25/2015 01/28/15   Katheren Shams, DO  predniSONE (DELTASONE) 20 MG tablet Take 3 tabs po on first day, 2 tabs second day, 2 tabs third day, 1 tab fourth day, 1 tab 5th day. Take with food. 04/09/15   Janne Napoleon, NP  Prenatal Vit-Fe Fumarate-FA (PRENATAL MULTIVITAMIN) TABS tablet Take 1 tablet by mouth daily.    Historical Provider, MD   Meds Ordered and Administered this Visit  Medications  albuterol (PROVENTIL) (2.5 MG/3ML) 0.083% nebulizer solution 2.5 mg (2.5 mg Nebulization Given 04/09/15 1710)  ipratropium (ATROVENT) nebulizer solution 0.5 mg (0.5 mg Nebulization Given 04/09/15 1710)    BP 115/76 mmHg  Pulse 86  Temp(Src) 98.3 F (36.8 C) (Oral)  SpO2 100% No data found.   Physical Exam  Constitutional: She appears well-developed and well-nourished. No distress.  HENT:  Mouth/Throat: Oropharynx is clear and moist. No oropharyngeal exudate.  Eyes: Conjunctivae and EOM are normal.  Neck: Normal range of motion. Neck supple.  Cardiovascular: Normal rate, regular rhythm and normal heart  sounds.   Pulmonary/Chest: Effort normal.  Auscultation by the examiner was status post bronchial dilator. Her lungs are clear. No wheezing or other adventitious sounds.  Neurological: She is alert. She exhibits normal muscle tone.  Skin: Skin is warm and dry.  Nursing note and vitals reviewed.   ED Course  Procedures (including critical care time)  Labs Review Labs Reviewed - No data to display  Imaging Review No results found.   Visual Acuity Review  Right Eye Distance:   Left Eye Distance:   Bilateral Distance:    Right Eye Near:   Left Eye Near:    Bilateral Near:         MDM   1. Asthma exacerbation attacks, mild intermittent    Status post DuoNeb the patient states she is breathing well and has no additional coughing. Auscultation reveals her lungs are clear. No wheezing or other adventitious sounds. Refill albuterol HFA and prednisone taper dose. These to obtain a follow-up with PCP for asthma management as needed.    Janne Napoleon, NP 04/09/15 [phone]

## 2015-04-09 NOTE — ED Notes (Signed)
Reports she has been having problems breathing  all week, used the last of her albuterol MDI earlier today. Presents w SOB, diffuse wheezing bilateral . States has problems every year about this time

## 2015-05-13 ENCOUNTER — Ambulatory Visit: Payer: Medicaid Other

## 2015-05-20 ENCOUNTER — Other Ambulatory Visit: Payer: Self-pay | Admitting: Family Medicine

## 2015-05-20 ENCOUNTER — Ambulatory Visit (INDEPENDENT_AMBULATORY_CARE_PROVIDER_SITE_OTHER): Payer: Medicaid Other

## 2015-05-20 VITALS — BP 120/64 | HR 64 | Wt 182.2 lb

## 2015-05-20 DIAGNOSIS — Z3042 Encounter for surveillance of injectable contraceptive: Secondary | ICD-10-CM

## 2015-05-20 MED ORDER — HYDROCORTISONE 1 % EX OINT: 1.0000 "application " | TOPICAL_OINTMENT | Freq: Every day | CUTANEOUS | Status: DC

## 2015-05-20 MED ORDER — ALBUTEROL SULFATE HFA 108 (90 BASE) MCG/ACT IN AERS: 2.0000 | INHALATION_SPRAY | RESPIRATORY_TRACT | Status: DC | PRN

## 2015-05-20 NOTE — Addendum Note (Signed)
Addended by: Michel Harrow on: 05/20/2015 02:48 PM   Modules accepted: Orders

## 2015-06-20 ENCOUNTER — Emergency Department (HOSPITAL_COMMUNITY)
Admission: EM | Admit: 2015-06-20 | Discharge: 2015-06-20 | Disposition: A | Discharge status: Home / Self Care | Payer: Medicaid Other | Attending: Emergency Medicine | Admitting: Emergency Medicine

## 2015-06-20 ENCOUNTER — Encounter (HOSPITAL_COMMUNITY): Payer: Self-pay | Admitting: Emergency Medicine

## 2015-06-20 DIAGNOSIS — R51 Headache: Secondary | ICD-10-CM | POA: Diagnosis not present

## 2015-06-20 DIAGNOSIS — J45909 Unspecified asthma, uncomplicated: Secondary | ICD-10-CM | POA: Insufficient documentation

## 2015-06-20 DIAGNOSIS — Z79899 Other long term (current) drug therapy: Secondary | ICD-10-CM | POA: Diagnosis not present

## 2015-06-20 DIAGNOSIS — R519 Headache, unspecified: Secondary | ICD-10-CM

## 2015-06-20 DIAGNOSIS — Z7952 Long term (current) use of systemic steroids: Secondary | ICD-10-CM | POA: Insufficient documentation

## 2015-06-20 DIAGNOSIS — Z87891 Personal history of nicotine dependence: Secondary | ICD-10-CM | POA: Insufficient documentation

## 2015-06-20 DIAGNOSIS — Z8742 Personal history of other diseases of the female genital tract: Secondary | ICD-10-CM | POA: Diagnosis not present

## 2015-06-20 MED ORDER — DIPHENHYDRAMINE HCL 50 MG/ML IJ SOLN
25.0000 mg | Freq: Once | INTRAMUSCULAR | Status: AC
Start: 2015-06-20 — End: 2015-06-20
  Administered 2015-06-20: 25 mg via INTRAVENOUS
  Filled 2015-06-20: qty 1

## 2015-06-20 MED ORDER — METOCLOPRAMIDE HCL 5 MG/ML IJ SOLN
10.0000 mg | Freq: Once | INTRAMUSCULAR | Status: AC
  Administered 2015-06-20: 10 mg via INTRAVENOUS
  Filled 2015-06-20: qty 2

## 2015-06-20 MED ORDER — KETOROLAC TROMETHAMINE 30 MG/ML IJ SOLN
30.0000 mg | Freq: Once | INTRAMUSCULAR | Status: AC
  Administered 2015-06-20: 30 mg via INTRAVENOUS
  Filled 2015-06-20: qty 1

## 2015-06-20 NOTE — Discharge Instructions (Signed)
You can continue taking Aleve or Ibuprofen for the headache if needed.

## 2015-06-20 NOTE — ED Notes (Addendum)
Pt c /o headache ongoing for past few days. Pt reports that it started at the bridge of her nose and now is on the right side of her head. Pt took aleve about 730 this morning with some relief.

## 2015-06-20 NOTE — ED Provider Notes (Signed)
CSN: [id number]     Arrival date & time 06/20/15  1725 History   First MD Initiated Contact with Patient 06/20/15 1746     Chief Complaint  Patient presents with  . Headache     (Consider location/radiation/quality/duration/timing/severity/associated sxs/prior Treatment) HPI Comments: Patient presents today with a chief complaint of a headache.  She states that the headache has been intermittent over the past 2 days.  Headache initially located on the bridge of her nose, but she states that it is now located in the bilateral temporal area.  Headache gradual in onset.  She states that she took Aleve earlier today for her symptoms, which helped.  She reports mild associated nausea, but no vomiting.  She denies any vision changes.  Denies fever, chills, neck stiffness/pain, ear pain, sore throat, numbness, tingling, or focal weakness.  No head injury or trauma.  Patient denies history of Migraines, but states that she has had similar headaches in the past.  Patient is a 30 y.o. female presenting with headaches. The history is provided by the patient.  Headache   Past Medical History  Diagnosis Date  . Ovarian cyst   . Asthma     uses inhaler as needed   Past Surgical History  Procedure Laterality Date  . Ovarian cyst surgery    . Duramoid      cyst re;moved in 09  . Wisdom tooth extraction     Family History  Problem Relation Age of Onset  . Diabetes Mother   . Hypertension Mother   . Heart disease Mother    Social History  Substance Use Topics  . Smoking status: Former Smoker -- 0.50 packs/day    Types: Cigarettes    Quit date: 05/30/2014  . Smokeless tobacco: Never Used  . Alcohol Use: No   OB History    Gravida Para Term Preterm AB TAB SAB Ectopic Multiple Living   1 1 1  0 0 0 0 0 0 1     Review of Systems  Neurological: Positive for headaches.  All other systems reviewed and are negative.     Allergies  Peanut-containing drug products and Peanuts  Home  Medications   Prior to Admission medications   Medication Sig Start Date End Date Taking? Authorizing Provider  albuterol (PROVENTIL HFA;VENTOLIN HFA) 108 (90 Base) MCG/ACT inhaler Inhale 2 puffs into the lungs every 4 (four) hours as needed for wheezing or shortness of breath. 05/20/15  Yes Tanna Savoy Stinson, DO  EPINEPHrine (EPIPEN) 0.3 mg/0.3 mL DEVI Inject 0.3 mg into the muscle once as needed (for allergic reaction).    Yes Historical Provider, MD  hydrocortisone 1 % ointment Apply 1 application topically daily. 05/20/15  Yes Tanna Savoy Stinson, DO  naproxen sodium (ANAPROX) 220 MG tablet Take 220 mg by mouth 2 (two) times daily as needed (for pain).   Yes Historical Provider, MD  Prenatal Vit-Fe Fumarate-FA (PRENATAL MULTIVITAMIN) TABS tablet Take 1 tablet by mouth daily.   Yes Historical Provider, MD  ibuprofen (ADVIL,MOTRIN) 600 MG tablet Take 1 tablet (600 mg total) by mouth every 6 (six) hours. 01/14/15   Katheren Shams, DO  metroNIDAZOLE (FLAGYL) 500 MG tablet Take 1 tablet (500 mg total) by mouth 2 (two) times daily. 02/26/15   Osborne Oman, MD  norethindrone (MICRONOR,CAMILA,ERRIN) 0.35 MG tablet Take 1 tablet (0.35 mg total) by mouth daily. Patient not taking: Reported on 02/25/2015 01/28/15   Katheren Shams, DO  predniSONE (DELTASONE) 20 MG tablet Take 3  tabs po on first day, 2 tabs second day, 2 tabs third day, 1 tab fourth day, 1 tab 5th day. Take with food. 04/09/15   Janne Napoleon, NP   BP 135/99 mmHg  Pulse 104  Temp(Src) 98 F (36.7 C) (Oral)  Resp 20  SpO2 100%  Breastfeeding? No Physical Exam  Constitutional: She appears well-developed and well-nourished.  HENT:  Head: Normocephalic and atraumatic.  Mouth/Throat: Oropharynx is clear and moist.  Eyes: EOM are normal. Pupils are equal, round, and reactive to light.  Neck: Normal range of motion. Neck supple.  Cardiovascular: Normal rate, regular rhythm and normal heart sounds.   Pulmonary/Chest: Effort normal and breath  sounds normal.  Musculoskeletal: Normal range of motion.  Neurological: She is alert. She has normal strength. No cranial nerve deficit or sensory deficit. Coordination and gait normal.  Normal finger to nose testing Normal gait, no ataxia Normal rapid alternating movements  Skin: Skin is warm and dry.  Psychiatric: She has a normal mood and affect.  Nursing note and vitals reviewed.   ED Course  Procedures (including critical care time) Labs Review Labs Reviewed - No data to display  Imaging Review No results found. I have personally reviewed and evaluated these images and lab results as part of my medical decision-making.   EKG Interpretation None     6:58 PM Reassessed patient.  She reports improvement of headache at this time. MDM   Final diagnoses:  None   Pt HA treated and improved while in ED.  Presentation is like pts typical HA and non concerning for [facility], ICH, Meningitis, or temporal arteritis. Pt is afebrile with no focal neuro deficits, nuchal rigidity, or change in vision. Pt is stable for discharge.  Pt verbalizes understanding and is agreeable with plan to dc.  Return precautions given     Hyman Bible, PA-C 06/20/15 1902  Carmin Muskrat, MD 06/21/15 0001

## 2015-06-20 NOTE — ED Notes (Signed)
Pt verbalized understanding of d/c instructions and follow-up care. No further questions/concerns, VSS, ambulatory w/ steady gait (refused wheelchair) 

## 2015-08-05 ENCOUNTER — Ambulatory Visit: Payer: Medicaid Other

## 2015-12-26 ENCOUNTER — Encounter (HOSPITAL_COMMUNITY): Payer: Self-pay | Admitting: Emergency Medicine

## 2015-12-26 ENCOUNTER — Ambulatory Visit (HOSPITAL_COMMUNITY)
Admission: EM | Admit: 2015-12-26 | Discharge: 2015-12-26 | Disposition: A | Discharge status: Home / Self Care | Payer: Medicaid Other | Attending: Emergency Medicine | Admitting: Emergency Medicine

## 2015-12-26 DIAGNOSIS — L309 Dermatitis, unspecified: Secondary | ICD-10-CM

## 2015-12-26 MED ORDER — PREDNISONE 10 MG PO TABS: 20.0000 mg | ORAL_TABLET | Freq: Every day | ORAL | 0 refills | Status: DC

## 2015-12-26 MED ORDER — HYDROCORTISONE 1 % EX CREA: TOPICAL_CREAM | CUTANEOUS | 3 refills | Status: DC

## 2015-12-26 NOTE — ED Triage Notes (Signed)
The patient presented to the [facility] with a complaint of a flair up of her eczema that has been ongoing for the last month.

## 2015-12-26 NOTE — ED Provider Notes (Signed)
CSN: [id number]     Arrival date & time 12/26/15  1302 History   First MD Initiated Contact with Patient 12/26/15 1351     Chief Complaint  Patient presents with  . Eczema   (Consider location/radiation/quality/duration/timing/severity/associated sxs/prior Treatment) HPI 30 year old female with a long-standing history of atopic dermatitis. She states that she is out of her steroid cream at this time and she has a flare of her eczema. She denies any pain at this time just out of control itching. She has used anti-itch medicine at home without any resolution. She states that she just got a primary care provider and will be following up with that person next month. Past Medical History:  Diagnosis Date  . Asthma    uses inhaler as needed  . Ovarian cyst    Past Surgical History:  Procedure Laterality Date  . duramoid     cyst re;moved in 09  . OVARIAN CYST SURGERY    . WISDOM TOOTH EXTRACTION     Family History  Problem Relation Age of Onset  . Diabetes Mother   . Hypertension Mother   . Heart disease Mother    Social History  Substance Use Topics  . Smoking status: Former Smoker    Packs/day: 0.50    Types: Cigarettes    Quit date: 05/30/2014  . Smokeless tobacco: Never Used  . Alcohol use No   OB History    Gravida Para Term Preterm AB Living   1 1 1  0 0 1   SAB TAB Ectopic Multiple Live Births   0 0 0 0 1     Review of Systems  Denies: HEADACHE, NAUSEA, ABDOMINAL PAIN, CHEST PAIN, CONGESTION, DYSURIA, SHORTNESS OF BREATH  Allergies  Peanut-containing drug products and Peanuts [peanut oil]  Home Medications   Prior to Admission medications   Medication Sig Start Date End Date Taking? Authorizing Provider  albuterol (PROVENTIL HFA;VENTOLIN HFA) 108 (90 Base) MCG/ACT inhaler Inhale 2 puffs into the lungs every 4 (four) hours as needed for wheezing or shortness of breath. 05/20/15  Yes Tanna Savoy Stinson, DO  EPINEPHrine (EPIPEN) 0.3 mg/0.3 mL DEVI Inject 0.3 mg into the  muscle once as needed (for allergic reaction).     Historical Provider, MD  hydrocortisone 1 % ointment Apply 1 application topically daily. 05/20/15   Truett Mainland, DO  hydrocortisone cream 1 % Apply to affected area 2 times daily 12/26/15   Konrad Felix, PA  ibuprofen (ADVIL,MOTRIN) 600 MG tablet Take 1 tablet (600 mg total) by mouth every 6 (six) hours. 01/14/15   Katheren Shams, DO  metroNIDAZOLE (FLAGYL) 500 MG tablet Take 1 tablet (500 mg total) by mouth 2 (two) times daily. 02/26/15   Osborne Oman, MD  naproxen sodium (ANAPROX) 220 MG tablet Take 220 mg by mouth 2 (two) times daily as needed (for pain).    Historical Provider, MD  norethindrone (MICRONOR,CAMILA,ERRIN) 0.35 MG tablet Take 1 tablet (0.35 mg total) by mouth daily. Patient not taking: Reported on 02/25/2015 01/28/15   Katheren Shams, DO  predniSONE (DELTASONE) 10 MG tablet Take 2 tablets (20 mg total) by mouth daily. 12/26/15   Konrad Felix, PA  predniSONE (DELTASONE) 20 MG tablet Take 3 tabs po on first day, 2 tabs second day, 2 tabs third day, 1 tab fourth day, 1 tab 5th day. Take with food. 04/09/15   Janne Napoleon, NP  Prenatal Vit-Fe Fumarate-FA (PRENATAL MULTIVITAMIN) TABS tablet Take 1 tablet by mouth daily.  Historical Provider, MD   Meds Ordered and Administered this Visit  Medications - No data to display  BP 137/89 (BP Location: Left Arm)   Pulse 63   Temp 98.6 F (37 C) (Oral)   Resp 18   LMP 12/05/2015 (Exact Date)   SpO2 100%  No data found.   Physical Exam NURSES NOTES AND VITAL SIGNS REVIEWED. CONSTITUTIONAL: Well developed, well nourished, no acute distress HEENT: normocephalic, atraumatic EYES: Conjunctiva normal NECK:normal ROM, supple, no adenopathy PULMONARY:No respiratory distress, normal effort ABDOMINAL: Soft, ND, NT BS+, No CVAT MUSCULOSKELETAL: Normal ROM of all extremities,  SKIN: warm and dry dry scaly eczema (atopic dermatitis) lesions on both breasts both antecubitals left  shoulder and posterior aspect of both thighs PSYCHIATRIC: Mood and affect, behavior are normal  Urgent Care Course   Clinical Course    Procedures (including critical care time)  Labs Review Labs Reviewed - No data to display  Imaging Review No results found.   Visual Acuity Review  Right Eye Distance:   Left Eye Distance:   Bilateral Distance:    Right Eye Near:   Left Eye Near:    Bilateral Near:       Prescriptions for prednisone and hydrocortisone cream 1% are sent to pharmacy of patient's request.  MDM   1. Eczema     Patient is reassured that there are no issues that require transfer to higher level of care at this time or additional tests. Patient is advised to continue home symptomatic treatment. Patient is advised that if there are new or worsening symptoms to attend the emergency department, contact primary care provider, or return to UC. Instructions of care provided discharged home in stable condition.    THIS NOTE WAS GENERATED USING A VOICE RECOGNITION SOFTWARE PROGRAM. ALL REASONABLE EFFORTS  WERE MADE TO PROOFREAD THIS DOCUMENT FOR ACCURACY.  I have verbally reviewed the discharge instructions with the patient. A printed AVS was given to the patient.  All questions were answered prior to discharge.      Konrad Felix, [place] 12/26/15 1434

## 2016-03-07 ENCOUNTER — Ambulatory Visit (HOSPITAL_COMMUNITY)
Admission: EM | Admit: 2016-03-07 | Discharge: 2016-03-07 | Disposition: A | Discharge status: Home / Self Care | Payer: Medicaid Other | Attending: Family Medicine | Admitting: Family Medicine

## 2016-03-07 ENCOUNTER — Encounter (HOSPITAL_COMMUNITY): Payer: Self-pay | Admitting: Emergency Medicine

## 2016-03-07 DIAGNOSIS — J4521 Mild intermittent asthma with (acute) exacerbation: Secondary | ICD-10-CM | POA: Diagnosis not present

## 2016-03-07 MED ORDER — IPRATROPIUM-ALBUTEROL 0.5-2.5 (3) MG/3ML IN SOLN
RESPIRATORY_TRACT | Status: AC
  Filled 2016-03-07: qty 3

## 2016-03-07 MED ORDER — METHYLPREDNISOLONE ACETATE 80 MG/ML IJ SUSP
80.0000 mg | Freq: Once | INTRAMUSCULAR | Status: AC
  Administered 2016-03-07: 80 mg via INTRAMUSCULAR

## 2016-03-07 MED ORDER — IPRATROPIUM-ALBUTEROL 0.5-2.5 (3) MG/3ML IN SOLN
3.0000 mL | Freq: Once | RESPIRATORY_TRACT | Status: AC
  Administered 2016-03-07: 3 mL via RESPIRATORY_TRACT

## 2016-03-07 MED ORDER — METHYLPREDNISOLONE ACETATE 80 MG/ML IJ SUSP
INTRAMUSCULAR | Status: AC
  Filled 2016-03-07: qty 1

## 2016-03-07 NOTE — ED Provider Notes (Signed)
[place]    CSN: [id number] Arrival date & time: 03/07/16  1912     History   Chief Complaint Chief Complaint  Patient presents with  . Shortness of Breath    HPI Ariana Young is a 30 y.o. female.   Is a 30 year old known asthmatic who comes in short of breath for the last 2 hours having failed to improve after taking 2 inhaler treatments.  She does not believe she is come in contact with any nuts of any kind.  She does have plenty of inhalers.  Patient has taken prednisone in the past with good effect.      Past Medical History:  Diagnosis Date  . Asthma    uses inhaler as needed  . Ovarian cyst     There are no active problems to display for this patient.   Past Surgical History:  Procedure Laterality Date  . duramoid     cyst re;moved in 09  . OVARIAN CYST SURGERY    . WISDOM TOOTH EXTRACTION      OB History    Gravida Para Term Preterm AB Living   1 1 1  0 0 1   SAB TAB Ectopic Multiple Live Births   0 0 0 0 1       Home Medications    Prior to Admission medications   Medication Sig Start Date End Date Taking? Authorizing Provider  albuterol (PROVENTIL HFA;VENTOLIN HFA) 108 (90 Base) MCG/ACT inhaler Inhale 2 puffs into the lungs every 4 (four) hours as needed for wheezing or shortness of breath. 05/20/15  Yes Tanna Savoy Stinson, DO  predniSONE (DELTASONE) 10 MG tablet Take 2 tablets (20 mg total) by mouth daily. 12/26/15  Yes Konrad Felix, PA  EPINEPHrine (EPIPEN) 0.3 mg/0.3 mL DEVI Inject 0.3 mg into the muscle once as needed (for allergic reaction).     Historical Provider, MD  hydrocortisone 1 % ointment Apply 1 application topically daily. 05/20/15   Truett Mainland, DO  hydrocortisone cream 1 % Apply to affected area 2 times daily 12/26/15   Konrad Felix, PA  ibuprofen (ADVIL,MOTRIN) 600 MG tablet Take 1 tablet (600 mg total) by mouth every 6 (six) hours. 01/14/15   Katheren Shams, DO  metroNIDAZOLE (FLAGYL) 500 MG tablet Take 1  tablet (500 mg total) by mouth 2 (two) times daily. 02/26/15   Osborne Oman, MD  naproxen sodium (ANAPROX) 220 MG tablet Take 220 mg by mouth 2 (two) times daily as needed (for pain).    Historical Provider, MD  norethindrone (MICRONOR,CAMILA,ERRIN) 0.35 MG tablet Take 1 tablet (0.35 mg total) by mouth daily. Patient not taking: Reported on 02/25/2015 01/28/15   Katheren Shams, DO  predniSONE (DELTASONE) 20 MG tablet Take 3 tabs po on first day, 2 tabs second day, 2 tabs third day, 1 tab fourth day, 1 tab 5th day. Take with food. 04/09/15   Janne Napoleon, NP  Prenatal Vit-Fe Fumarate-FA (PRENATAL MULTIVITAMIN) TABS tablet Take 1 tablet by mouth daily.    Historical Provider, MD    Family History Family History  Problem Relation Age of Onset  . Diabetes Mother   . Hypertension Mother   . Heart disease Mother     Social History Social History  Substance Use Topics  . Smoking status: Former Smoker    Packs/day: 0.50    Types: Cigarettes    Quit date: 05/30/2014  . Smokeless tobacco: Never Used  . Alcohol use No  Allergies   Peanut-containing drug products and Peanuts [peanut oil]   Review of Systems Review of Systems  Constitutional: Negative.   HENT: Negative.   Respiratory: Positive for chest tightness and shortness of breath.   Cardiovascular: Negative.      Physical Exam Triage Vital Signs ED Triage Vitals  Enc Vitals Group     BP 03/07/16 1913 122/72     Pulse Rate 03/07/16 1913 77     Resp 03/07/16 1913 24     Temp 03/07/16 1913 98.2 F (36.8 C)     Temp Source 03/07/16 1913 Oral     SpO2 03/07/16 1913 100 %     Weight --      Height --      Head Circumference --      Peak Flow --      Pain Score 03/07/16 1926 8     Pain Loc --      Pain Edu? --      Excl. in [place]? --    No data found.   Updated Vital Signs BP 122/72 (BP Location: Left Arm)   Pulse 77   Temp 98.2 F (36.8 C) (Oral)   Resp 24   SpO2 100%       Physical Exam  Constitutional: She  is oriented to person, place, and time. She appears well-developed and well-nourished. She appears distressed.  HENT:  Head: Normocephalic.  Right Ear: External ear normal.  Left Ear: External ear normal.  Nose: Nose normal.  Mouth/Throat: Oropharynx is clear and moist.  Eyes: Conjunctivae and EOM are normal. Pupils are equal, round, and reactive to light.  Neck: Normal range of motion.  Cardiovascular: Normal rate, regular rhythm, normal heart sounds and intact distal pulses.   Pulmonary/Chest: She is in respiratory distress. She has wheezes.  Musculoskeletal: Normal range of motion.  Neurological: She is alert and oriented to person, place, and time.  Skin: Skin is warm and dry.  Nursing note and vitals reviewed.  Significant improvement in aeration after DuoNeb nebulizer treatment  UC Treatments / Results  Labs (all labs ordered are listed, but only abnormal results are displayed) Labs Reviewed - No data to display  EKG  EKG Interpretation None       Radiology No results found.  Procedures Procedures (including critical care time)  Medications Ordered in UC Medications  methylPREDNISolone acetate (DEPO-MEDROL) injection 80 mg (not administered)  ipratropium-albuterol (DUONEB) 0.5-2.5 (3) MG/3ML nebulizer solution 3 mL (3 mLs Nebulization Given 03/07/16 1930)     Initial Impression / Assessment and Plan / UC Course  I have reviewed the triage vital signs and the nursing notes.  Pertinent labs & imaging results that were available during my care of the patient were reviewed by me and considered in my medical decision making (see chart for details).  Clinical Course     Final Clinical Impressions(s) / UC Diagnoses   Final diagnoses:  Mild intermittent asthma with exacerbation  Patient has good response to the nebulizer treatment here  New Prescriptions New Prescriptions   No medications on file  Patient instructed not to continue with her inhalers.     Robyn Haber, MD 03/07/16 2007

## 2016-03-07 NOTE — ED Triage Notes (Addendum)
The patient presented to the [facility] with a complaint of shortness of breath with an onset of 2 hours ago. The patient reported using her rescue inhaler 2 times with no relief. The patient presented with labored breathing and wheezing bilaterally throughout. The patient presented ina tripod position when sitting and obvious use of accessory muscles were noted. The patient was not able to speak complete sentences due to SOB.

## 2016-03-08 ENCOUNTER — Emergency Department (HOSPITAL_COMMUNITY)
Admission: EM | Admit: 2016-03-08 | Discharge: 2016-03-08 | Disposition: A | Discharge status: Home / Self Care | Payer: Medicaid Other | Attending: Emergency Medicine | Admitting: Emergency Medicine

## 2016-03-08 ENCOUNTER — Ambulatory Visit (INDEPENDENT_AMBULATORY_CARE_PROVIDER_SITE_OTHER): Payer: Medicaid Other

## 2016-03-08 ENCOUNTER — Ambulatory Visit (HOSPITAL_COMMUNITY)
Admission: EM | Admit: 2016-03-08 | Discharge: 2016-03-08 | Disposition: A | Discharge status: Other Acute Inpatient Hospital | Payer: Medicaid Other | Attending: Family Medicine | Admitting: Family Medicine

## 2016-03-08 ENCOUNTER — Encounter (HOSPITAL_COMMUNITY): Payer: Self-pay | Admitting: *Deleted

## 2016-03-08 ENCOUNTER — Encounter (HOSPITAL_COMMUNITY): Payer: Self-pay

## 2016-03-08 DIAGNOSIS — Z79899 Other long term (current) drug therapy: Secondary | ICD-10-CM | POA: Diagnosis not present

## 2016-03-08 DIAGNOSIS — Z87891 Personal history of nicotine dependence: Secondary | ICD-10-CM | POA: Insufficient documentation

## 2016-03-08 DIAGNOSIS — J45909 Unspecified asthma, uncomplicated: Secondary | ICD-10-CM | POA: Diagnosis present

## 2016-03-08 DIAGNOSIS — J4521 Mild intermittent asthma with (acute) exacerbation: Secondary | ICD-10-CM

## 2016-03-08 DIAGNOSIS — Z91010 Allergy to peanuts: Secondary | ICD-10-CM | POA: Insufficient documentation

## 2016-03-08 DIAGNOSIS — J4541 Moderate persistent asthma with (acute) exacerbation: Secondary | ICD-10-CM | POA: Diagnosis not present

## 2016-03-08 DIAGNOSIS — J45901 Unspecified asthma with (acute) exacerbation: Secondary | ICD-10-CM | POA: Insufficient documentation

## 2016-03-08 MED ORDER — ALBUTEROL SULFATE HFA 108 (90 BASE) MCG/ACT IN AERS
1.0000 | INHALATION_SPRAY | Freq: Once | RESPIRATORY_TRACT | Status: AC
  Administered 2016-03-08: 1 via RESPIRATORY_TRACT
  Filled 2016-03-08: qty 6.7

## 2016-03-08 MED ORDER — IPRATROPIUM BROMIDE 0.02 % IN SOLN
0.5000 mg | Freq: Once | RESPIRATORY_TRACT | Status: AC
  Administered 2016-03-08: 0.5 mg via RESPIRATORY_TRACT

## 2016-03-08 MED ORDER — ALBUTEROL SULFATE HFA 108 (90 BASE) MCG/ACT IN AERS
1.0000 | INHALATION_SPRAY | Freq: Four times a day (QID) | RESPIRATORY_TRACT | 0 refills | Status: DC | PRN
Start: 2016-03-08 — End: 2017-02-23

## 2016-03-08 MED ORDER — GUAIFENESIN 100 MG/5ML PO SYRP: 100.0000 mg | ORAL_SOLUTION | ORAL | 0 refills | Status: DC | PRN

## 2016-03-08 MED ORDER — ALBUTEROL (5 MG/ML) CONTINUOUS INHALATION SOLN
10.0000 mg/h | INHALATION_SOLUTION | RESPIRATORY_TRACT | Status: DC
  Administered 2016-03-08: 10 mg/h via RESPIRATORY_TRACT

## 2016-03-08 MED ORDER — ACETAMINOPHEN 325 MG PO TABS
325.0000 mg | ORAL_TABLET | Freq: Once | ORAL | Status: DC
  Filled 2016-03-08: qty 1

## 2016-03-08 MED ORDER — PREDNISONE 20 MG PO TABS
60.0000 mg | ORAL_TABLET | Freq: Once | ORAL | Status: AC
  Administered 2016-03-08: 60 mg via ORAL
  Filled 2016-03-08: qty 3

## 2016-03-08 MED ORDER — ALBUTEROL SULFATE (2.5 MG/3ML) 0.083% IN NEBU
5.0000 mg | INHALATION_SOLUTION | Freq: Once | RESPIRATORY_TRACT | Status: AC
  Administered 2016-03-08: 5 mg via RESPIRATORY_TRACT

## 2016-03-08 MED ORDER — ACETAMINOPHEN 325 MG PO TABS
650.0000 mg | ORAL_TABLET | Freq: Once | ORAL | Status: AC
  Administered 2016-03-08: 650 mg via ORAL

## 2016-03-08 MED ORDER — ALBUTEROL SULFATE (2.5 MG/3ML) 0.083% IN NEBU
INHALATION_SOLUTION | RESPIRATORY_TRACT | Status: AC
  Filled 2016-03-08: qty 6

## 2016-03-08 MED ORDER — IPRATROPIUM BROMIDE 0.02 % IN SOLN
RESPIRATORY_TRACT | Status: AC
  Filled 2016-03-08: qty 2.5

## 2016-03-08 MED ORDER — PREDNISONE 20 MG PO TABS: 60.0000 mg | ORAL_TABLET | Freq: Every day | ORAL | 0 refills | Status: DC

## 2016-03-08 MED ORDER — ALBUTEROL (5 MG/ML) CONTINUOUS INHALATION SOLN
5.0000 mg/h | INHALATION_SOLUTION | Freq: Once | RESPIRATORY_TRACT | Status: DC
  Filled 2016-03-08: qty 20

## 2016-03-08 NOTE — ED Notes (Signed)
Ambulatory to tx room without distress.

## 2016-03-08 NOTE — ED Notes (Signed)
Pt c/o headache. Will notify PA.

## 2016-03-08 NOTE — ED Notes (Signed)
Called security to take visitor to parking deck. Pt and visitor gone when security arrived.

## 2016-03-08 NOTE — ED Provider Notes (Signed)
[facility] DEPT Provider Note   CSN: [id number] Arrival date & time: 03/08/16  1325  By signing my name below, I, Ariana Young, attest that this documentation has been prepared under the direction and in the presence of Shary Decamp, PA-C. Electronically Signed: Sonum Young, Education administrator. 03/08/16. 2:28 PM.  History   Chief Complaint Chief Complaint  Patient presents with  . from [facility]  . cough/asthma    The history is provided by the patient. No language interpreter was used.    HPI Comments: Ariana Young is a 30 y.o. female who presents to the Emergency Department complaining of an asthma exacerbation involving cough and chest tightness that began 1 day ago. Patient was seen at an UC yesterday and again today. Yesterday she was given a nebulizer treatment and steroid injection to which she responded well. She was also seen at an UC today for the same and had a negative CXR. She was advised to come to the ED for further evaluation. She currently continues to have chest tightness with an associated gradual onset HA. She has inhalers at home but does not have a nebulizer machine. She reports her last asthma exacerbation was almost 10 years ago. No fevers. No other symptoms noted.    Past Medical History:  Diagnosis Date  . Asthma    uses inhaler as needed  . Ovarian cyst     There are no active problems to display for this patient.   Past Surgical History:  Procedure Laterality Date  . duramoid     cyst re;moved in 09  . OVARIAN CYST SURGERY    . WISDOM TOOTH EXTRACTION      OB History    Gravida Para Term Preterm AB Living   1 1 1  0 0 1   SAB TAB Ectopic Multiple Live Births   0 0 0 0 1       Home Medications    Prior to Admission medications   Medication Sig Start Date End Date Taking? Authorizing Provider  albuterol (PROVENTIL HFA;VENTOLIN HFA) 108 (90 Base) MCG/ACT inhaler Inhale 2 puffs into the lungs every 4 (four) hours as needed for wheezing or shortness of  breath. 05/20/15   Tanna Savoy Stinson, DO  EPINEPHrine (EPIPEN) 0.3 mg/0.3 mL DEVI Inject 0.3 mg into the muscle once as needed (for allergic reaction).     Historical Provider, MD  hydrocortisone 1 % ointment Apply 1 application topically daily. 05/20/15   Truett Mainland, DO  hydrocortisone cream 1 % Apply to affected area 2 times daily 12/26/15   Konrad Felix, PA  ibuprofen (ADVIL,MOTRIN) 600 MG tablet Take 1 tablet (600 mg total) by mouth every 6 (six) hours. 01/14/15   Katheren Shams, DO  metroNIDAZOLE (FLAGYL) 500 MG tablet Take 1 tablet (500 mg total) by mouth 2 (two) times daily. 02/26/15   Osborne Oman, MD  naproxen sodium (ANAPROX) 220 MG tablet Take 220 mg by mouth 2 (two) times daily as needed (for pain).    Historical Provider, MD  norethindrone (MICRONOR,CAMILA,ERRIN) 0.35 MG tablet Take 1 tablet (0.35 mg total) by mouth daily. Patient not taking: Reported on 02/25/2015 01/28/15   Katheren Shams, DO  predniSONE (DELTASONE) 10 MG tablet Take 2 tablets (20 mg total) by mouth daily. 12/26/15   Konrad Felix, PA  predniSONE (DELTASONE) 20 MG tablet Take 3 tabs po on first day, 2 tabs second day, 2 tabs third day, 1 tab fourth day, 1 tab 5th day. Take with  food. 04/09/15   Janne Napoleon, NP  Prenatal Vit-Fe Fumarate-FA (PRENATAL MULTIVITAMIN) TABS tablet Take 1 tablet by mouth daily.    Historical Provider, MD    Family History Family History  Problem Relation Age of Onset  . Diabetes Mother   . Hypertension Mother   . Heart disease Mother     Social History Social History  Substance Use Topics  . Smoking status: Former Smoker    Packs/day: 0.50    Types: Cigarettes    Quit date: 05/30/2014  . Smokeless tobacco: Never Used  . Alcohol use No     Allergies   Peanut-containing drug products and Peanuts [peanut oil]   Review of Systems Review of Systems  HENT: Positive for congestion.   Respiratory: Positive for cough, chest tightness and shortness of breath.   All other  systems reviewed and are negative.  Physical Exam Updated Vital Signs BP 136/93 (BP Location: Left Arm)   Pulse 100   Temp 98 F (36.7 C)   Resp 20   SpO2 100%   Physical Exam  Constitutional: She is oriented to person, place, and time. Vital signs are normal. She appears well-developed and well-nourished.  NAD. No Tripoding. Resting Comfortably   HENT:  Head: Normocephalic and atraumatic.  Right Ear: Hearing and external ear normal.  Left Ear: Hearing and external ear normal.  Nose: Nose normal.  Mouth/Throat: Oropharynx is clear and moist. No oropharyngeal exudate or posterior oropharyngeal erythema.  Eyes: Conjunctivae and EOM are normal. Pupils are equal, round, and reactive to light.  Neck: Normal range of motion. Neck supple.  Cardiovascular: Normal rate and regular rhythm.   Pulmonary/Chest: Effort normal. No respiratory distress. She has wheezes in the right upper field, the right lower field, the left upper field and the left lower field.  Wheezing in all 4 lung fields.   Neurological: She is alert and oriented to person, place, and time.  Skin: Skin is warm and dry.  Psychiatric: She has a normal mood and affect. Her speech is normal and behavior is normal. Thought content normal.  Nursing note and vitals reviewed.  ED Treatments / Results  DIAGNOSTIC STUDIES: Oxygen Saturation is 100% on RA, normal by my interpretation.    COORDINATION OF CARE: 2:28 PM Discussed treatment plan with pt at bedside and pt agreed to plan.   Labs (all labs ordered are listed, but only abnormal results are displayed) Labs Reviewed - No data to display  EKG  EKG Interpretation None      Radiology Dg Chest 2 View  Result Date: 03/08/2016 CLINICAL DATA:  Asthma.  Cough EXAM: CHEST  2 VIEW COMPARISON:  06/17/2012 FINDINGS: The heart size and mediastinal contours are within normal limits. Both lungs are clear. The visualized skeletal structures are unremarkable. IMPRESSION: No  active cardiopulmonary disease. Electronically Signed   By: Franchot Gallo M.D.   On: 03/08/2016 12:32    Procedures Procedures (including critical care time)  Medications Ordered in ED Medications - No data to display   Initial Impression / Assessment and Plan / ED Course  I have reviewed the triage vital signs and the nursing notes.  Pertinent labs & imaging results that were available during my care of the patient were reviewed by me and considered in my medical decision making (see chart for details).  Clinical Course   Final Clinical Impressions(s) / ED Diagnoses  I have reviewed the relevant previous healthcare records.  I obtained HPI from historian.  ED Course:  Assessment: Patient ambulated in ED with O2 saturations maintained >90, no current signs of respiratory distress. Lung exam improved after nebulizer treatment. Prednisone given in the ED and pt will bd dc with 5 day burst. Given robitussin. Pt states they are breathing at baseline. Likely exacerbated by viral infection. CXR at UC negative. Pt has been instructed to continue using prescribed medications and to speak with PCP about today's exacerbation.   Disposition/Plan:  DC Home Additional Verbal discharge instructions given and discussed with patient.  Pt Instructed to f/u with PCP in the next week for evaluation and treatment of symptoms. Return precautions given Pt acknowledges and agrees with plan  Supervising Physician Lajean Saver, MD  Final diagnoses:  Mild intermittent asthma with exacerbation    New Prescriptions New Prescriptions   No medications on file  I personally performed the services described in this documentation, which was scribed in my presence. The recorded information has been reviewed and is accurate.    Shary Decamp, PA-C 03/08/16 [place], MD 03/11/16 [phone]

## 2016-03-08 NOTE — ED Notes (Signed)
Pt  Reports is  Doing  Somewhat  Better   At  This  Time  After the   Breathing  Treatment

## 2016-03-08 NOTE — Discharge Instructions (Signed)
Please read and follow all provided instructions.  Your diagnoses today include:  1. Mild intermittent asthma with exacerbation    Tests performed today include: Vital signs. See below for your results today.   Medications prescribed:   Take any prescribed medications only as directed.  Home care instructions:  Follow any educational materials contained in this packet.  Follow-up instructions: Please follow-up with your primary care provider in the next 3 days for further evaluation of your symptoms and management of your asthma.  Return instructions:  Please return to the Emergency Department if you experience worsening symptoms. Please return with worsening wheezing, shortness of breath, or difficulty breathing. Return with persistent fever above 101F.  Please return if you have any other emergent concerns.  Additional Information:  Your vital signs today were: BP 136/93 (BP Location: Left Arm)    Pulse 100    Temp 98 F (36.7 C)    Resp 20    SpO2 100%  If your blood pressure (BP) was elevated above 135/85 this visit, please have this repeated by your doctor within one month. --------------

## 2016-03-08 NOTE — ED Provider Notes (Signed)
[facility]    CSN: [id number] Arrival date & time: 03/08/16  1141     History   Chief Complaint Chief Complaint  Patient presents with  . Asthma    HPI Ariana Young is a 30 y.o. female.   The history is provided by the patient.  Asthma  This is a chronic problem. The current episode started 2 days ago (seen last eve with asthma and improved with rx, but during night began to relapse, this am acute worsening.). The problem has been rapidly worsening. Associated symptoms include shortness of breath.    Past Medical History:  Diagnosis Date  . Asthma    uses inhaler as needed  . Ovarian cyst     There are no active problems to display for this patient.   Past Surgical History:  Procedure Laterality Date  . duramoid     cyst re;moved in 09  . OVARIAN CYST SURGERY    . WISDOM TOOTH EXTRACTION      OB History    Gravida Para Term Preterm AB Living   1 1 1  0 0 1   SAB TAB Ectopic Multiple Live Births   0 0 0 0 1       Home Medications    Prior to Admission medications   Medication Sig Start Date End Date Taking? Authorizing Provider  albuterol (PROVENTIL HFA;VENTOLIN HFA) 108 (90 Base) MCG/ACT inhaler Inhale 2 puffs into the lungs every 4 (four) hours as needed for wheezing or shortness of breath. 05/20/15   Tanna Savoy Stinson, DO  EPINEPHrine (EPIPEN) 0.3 mg/0.3 mL DEVI Inject 0.3 mg into the muscle once as needed (for allergic reaction).     Historical Provider, MD  hydrocortisone 1 % ointment Apply 1 application topically daily. 05/20/15   Truett Mainland, DO  hydrocortisone cream 1 % Apply to affected area 2 times daily 12/26/15   Konrad Felix, PA  ibuprofen (ADVIL,MOTRIN) 600 MG tablet Take 1 tablet (600 mg total) by mouth every 6 (six) hours. 01/14/15   Katheren Shams, DO  metroNIDAZOLE (FLAGYL) 500 MG tablet Take 1 tablet (500 mg total) by mouth 2 (two) times daily. 02/26/15   Osborne Oman, MD  naproxen sodium (ANAPROX) 220 MG tablet Take 220  mg by mouth 2 (two) times daily as needed (for pain).    Historical Provider, MD  norethindrone (MICRONOR,CAMILA,ERRIN) 0.35 MG tablet Take 1 tablet (0.35 mg total) by mouth daily. Patient not taking: Reported on 02/25/2015 01/28/15   Katheren Shams, DO  predniSONE (DELTASONE) 10 MG tablet Take 2 tablets (20 mg total) by mouth daily. 12/26/15   Konrad Felix, PA  predniSONE (DELTASONE) 20 MG tablet Take 3 tabs po on first day, 2 tabs second day, 2 tabs third day, 1 tab fourth day, 1 tab 5th day. Take with food. 04/09/15   Janne Napoleon, NP  Prenatal Vit-Fe Fumarate-FA (PRENATAL MULTIVITAMIN) TABS tablet Take 1 tablet by mouth daily.    Historical Provider, MD    Family History Family History  Problem Relation Age of Onset  . Diabetes Mother   . Hypertension Mother   . Heart disease Mother     Social History Social History  Substance Use Topics  . Smoking status: Former Smoker    Packs/day: 0.50    Types: Cigarettes    Quit date: 05/30/2014  . Smokeless tobacco: Never Used  . Alcohol use No     Allergies   Peanut-containing drug products and Peanuts [  peanut oil]   Review of Systems Review of Systems  Constitutional: Negative for fever.  HENT: Negative.   Respiratory: Positive for shortness of breath and wheezing.   Cardiovascular: Negative.   Gastrointestinal: Negative.   All other systems reviewed and are negative.    Physical Exam Triage Vital Signs ED Triage Vitals  Enc Vitals Group     BP 03/08/16 1153 116/71     Pulse Rate 03/08/16 1153 72     Resp 03/08/16 1153 22     Temp 03/08/16 1153 97.7 F (36.5 C)     Temp Source 03/08/16 1153 Oral     SpO2 03/08/16 1153 100 %     Weight --      Height --      Head Circumference --      Peak Flow --      Pain Score 03/08/16 1205 0     Pain Loc --      Pain Edu? --      Excl. in [place]? --    No data found.   Updated Vital Signs BP 116/71 (BP Location: Right Arm)   Pulse 72   Temp 97.7 F (36.5 C) (Oral)   Resp 22    SpO2 100%   Visual Acuity Right Eye Distance:   Left Eye Distance:   Bilateral Distance:    Right Eye Near:   Left Eye Near:    Bilateral Near:     Physical Exam  Constitutional: She is oriented to person, place, and time. She appears well-developed and well-nourished. She appears distressed.  HENT:  Mouth/Throat: Oropharynx is clear and moist.  Eyes: Conjunctivae and EOM are normal. Pupils are equal, round, and reactive to light.  Neck: Normal range of motion.  Cardiovascular: Normal rate, regular rhythm, normal heart sounds and intact distal pulses.   Pulmonary/Chest: She has wheezes.  Lymphadenopathy:    She has no cervical adenopathy.  Neurological: She is alert and oriented to person, place, and time.  Skin: Skin is warm and dry.     UC Treatments / Results  Labs (all labs ordered are listed, but only abnormal results are displayed) Labs Reviewed - No data to display  EKG  EKG Interpretation None       Radiology No results found. X-rays reviewed and report per radiologist.  Procedures Procedures (including critical care time)  Medications Ordered in UC Medications - No data to display   Initial Impression / Assessment and Plan / UC Course  I have reviewed the triage vital signs and the nursing notes.  Pertinent labs & imaging results that were available during my care of the patient were reviewed by me and considered in my medical decision making (see chart for details).  Clinical Course    Sent for worsening asthma relapse from  [place] when given steroids. cxr neg  Final Clinical Impressions(s) / UC Diagnoses   Final diagnoses:  None    New Prescriptions New Prescriptions   No medications on file     Billy Fischer, MD 03/08/16 1336

## 2016-03-08 NOTE — ED Notes (Signed)
RT in.

## 2016-03-08 NOTE — ED Notes (Signed)
Report  Phoned  To   Amy  Nurse first

## 2016-03-08 NOTE — ED Notes (Signed)
pefr  400

## 2016-03-08 NOTE — ED Triage Notes (Signed)
Pt  Reports   Asthma   And  Shortness    Of   Breath          With  Bilateral    Wheezing   And      Use  Of  accesory  Muscles      She  Is   Sitting    Upright   She  Had  A  History  Of   Asthma   She  Was   Seen  Last  Pm

## 2016-03-08 NOTE — ED Triage Notes (Signed)
Patient sent from [facility] for further \\valuation  of asthma and cough. Patient seen yesterday and today for same. Received HHN and CXR at [facility] prior to transfer. Patient alert and oriented on arrival, speaking complete sentences, NAD

## 2016-03-08 NOTE — ED Notes (Signed)
RT called for treatment

## 2016-03-08 NOTE — ED Notes (Signed)
Per Nicole Kindred at [facility], patient was seen for wheezing.  SATS were good per Nicole Kindred, but peak flow was over 400.   Patient had a breathing treatment there and also a chest xray before coming to ED.

## 2016-04-22 ENCOUNTER — Ambulatory Visit (INDEPENDENT_AMBULATORY_CARE_PROVIDER_SITE_OTHER): Payer: Medicaid Other | Admitting: Obstetrics & Gynecology

## 2016-04-22 ENCOUNTER — Encounter: Payer: Self-pay | Admitting: Obstetrics & Gynecology

## 2016-04-22 VITALS — BP 118/67 | HR 69 | Ht 66.0 in | Wt 177.0 lb

## 2016-04-22 DIAGNOSIS — R102 Pelvic and perineal pain: Secondary | ICD-10-CM

## 2016-04-22 DIAGNOSIS — Z113 Encounter for screening for infections with a predominantly sexual mode of transmission: Secondary | ICD-10-CM

## 2016-04-22 LAB — POCT URINALYSIS DIP (DEVICE)
BILIRUBIN URINE: NEGATIVE
GLUCOSE, UA: NEGATIVE mg/dL
Hgb urine dipstick: NEGATIVE
KETONES UR: NEGATIVE mg/dL
Leukocytes, UA: NEGATIVE
Nitrite: NEGATIVE
PROTEIN: NEGATIVE mg/dL
Specific Gravity, Urine: >=1.03 (ref 1.005–1.030)
Urobilinogen, UA: 0.2 mg/dL (ref 0.0–1.0)
pH: 5.5 (ref 5.0–8.0)

## 2016-04-22 MED ORDER — NAPROXEN SODIUM 550 MG PO TABS: 550.0000 mg | ORAL_TABLET | Freq: Two times a day (BID) | ORAL | 2 refills | Status: DC

## 2016-04-22 NOTE — Progress Notes (Signed)
   GYNECOLOGY VISIT NOTE  History:  30 y.o. G1P1001 here today for pelvic pain for one month. Started out of the blue, happened 2.5 weeks before period.  Started on the left side, crampy in nature. History of dermoid cysts s/p ELAP and bilateral cystectomy in 2008.  She reports pain is similar. Happens intermittently, sporadic in nature.  Movement makes it better, squats help.  Pain has increased in intensity over the month; lying on left side makes it worse. Has taken OTC pain medications.  Normal periods. She denies any abnormal vaginal discharge, bleeding, pelvic pain or other concerns.   Past Medical History:  Diagnosis Date  . Asthma    uses inhaler as needed  . 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 OVARIAN CYST SURGERY  2008   ELAP, removal of bilateral dermoid cysts  . WISDOM TOOTH EXTRACTION      The following portions of the patient's history were reviewed and updated as appropriate: allergies, current medications, past family history, past medical history, past social history, past surgical history and problem list.   Health Maintenance:  Normal pap in 03/2014.    Review of Systems:  Pertinent items noted in HPI and remainder of comprehensive ROS otherwise negative.   Objective:  Physical Exam BP 118/67   Pulse 69   Ht 5\' 6"  (1.676 m)   Wt 177 lb (80.3 kg)   LMP 03/31/2016 (Exact Date)   BMI 28.57 kg/m  CONSTITUTIONAL: Well-developed, well-nourished female in no acute distress.  HENT:  Normocephalic, atraumatic. External right and left ear normal. Oropharynx is clear and moist EYES: Conjunctivae and EOM are normal. Pupils are equal, round, and reactive to light. No scleral icterus.  NECK: Normal range of motion, supple, no masses SKIN: Skin is warm and dry. No rash noted. Not diaphoretic. No erythema. No pallor. NEUROLOGIC: Alert and oriented to person, place, and time. Normal reflexes, muscle tone  coordination. No cranial nerve deficit noted. PSYCHIATRIC: Normal mood and affect. Normal behavior. Normal judgment and thought content. CARDIOVASCULAR: Normal heart rate noted RESPIRATORY: Effort and breath sounds normal, no problems with respiration noted ABDOMEN: Soft, no distention noted.   PELVIC: Normal appearing external genitalia; normal appearing vaginal mucosa and cervix.  No abnormal discharge noted; samples obtained. Normal uterine size, enlarged left ovary palpated.  Left adnexal tenderness noted. MUSCULOSKELETAL: Normal range of motion. No edema noted.  Labs and Imaging No results found.  Assessment & Plan:  1. Female pelvic pain Likely left ovarian cyst. Will follow up ultrasound and results and manage accordingly.  Naproxen prescribed for pain as needed. - POCT Urinalysis Dipstick - GC/Chlamydia probe amp ([facility])not at [facility] - Wet prep, genital - US Pelvis Complete; Future - US Transvaginal Non-OB; Future - naproxen sodium (ANAPROX) 550 MG tablet; Take 1 tablet (550 mg total) by mouth 2 (two) times daily with a meal. As needed for pain  Dispense: 60 tablet; Refill: 2  Routine preventative health maintenance measures emphasized. Please refer to After Visit Summary for other counseling recommendations.   Return if symptoms worsen or fail to improve.   Total face-to-face time with patient: 20 minutes. Over 50% of encounter was spent on counseling and coordination of care.   Verita Schneiders, MD, [place] Attending [place], [facility] for [employer], [place]

## 2016-04-22 NOTE — Patient Instructions (Signed)
Ovarian Cyst  An ovarian cyst is a fluid-filled sac that forms on an ovary. The ovaries are small organs that produce eggs in women. Various types of cysts can form on the ovaries. Some may cause symptoms and require treatment. Most ovarian cysts go away on their own, are not cancerous (are benign), and do not cause problems. Common types of ovarian cysts include:  Functional (follicle) cysts.  Occur during the menstrual cycle, and usually go away with the next menstrual cycle if you do not get pregnant.  Usually cause no symptoms.  Endometriomas.  Are cysts that form from the tissue that lines the uterus (endometrium).  Are sometimes called "chocolate cysts" because they become filled with blood that turns brown.  Can cause pain in the lower abdomen during intercourse and during your period.  Cystadenoma cysts.  Develop from cells on the outside surface of the ovary.  Can get very large and cause lower abdomen pain and pain with intercourse.  Can cause severe pain if they twist or break open (rupture).  Dermoid cysts.  Are sometimes found in both ovaries.  May contain different kinds of body tissue, such as skin, teeth, hair, or cartilage.  Usually do not cause symptoms unless they get very big.  Theca lutein cysts.  Occur when too much of a certain hormone (human chorionic gonadotropin) is produced and overstimulates the ovaries to produce an egg.  Are most common after having procedures used to assist with the conception of a baby (in vitro fertilization). What are the causes? Ovarian cysts may be caused by:  Ovarian hyperstimulation syndrome. This is a condition that can develop from taking fertility medicines. It causes multiple large ovarian cysts to form.  Polycystic ovarian syndrome (PCOS). This is a common hormonal disorder that can cause ovarian cysts, as well as problems with your period or fertility. What increases the risk? The following factors may make  you more likely to develop ovarian cysts:  Being overweight or obese.  Taking fertility medicines.  Taking certain forms of hormonal birth control.  Smoking. What are the signs or symptoms? Many ovarian cysts do not cause symptoms. If symptoms are present, they may include:  Pelvic pain or pressure.  Pain in the lower abdomen.  Pain during sex.  Abdominal swelling.  Abnormal menstrual periods.  Increasing pain with menstrual periods. How is this diagnosed? These cysts are commonly found during a routine pelvic exam. You may have tests to find out more about the cyst, such as:  Ultrasound.  X-ray of the pelvis.  CT scan.  MRI.  Blood tests. How is this treated? Many ovarian cysts go away on their own without treatment. Your health care provider may want to check your cyst regularly for 2-3 months to see if it changes. If you are in menopause, it is especially important to have your cyst monitored closely because menopausal women have a higher rate of ovarian cancer. When treatment is needed, it may include:  Medicines to help relieve pain.  A procedure to drain the cyst (aspiration).  Surgery to remove the whole cyst.  Hormone treatment or birth control pills. These methods are sometimes used to help dissolve a cyst. Follow these instructions at home:  Take over-the-counter and prescription medicines only as told by your health care provider.  Do not drive or use heavy machinery while taking prescription pain medicine.  Get regular pelvic exams and Pap tests as often as told by your health care provider.  Return to  your normal activities as told by your health care provider. Ask your health care provider what activities are safe for you.  Do not use any products that contain nicotine or tobacco, such as cigarettes and e-cigarettes. If you need help quitting, ask your health care provider.  Keep all follow-up visits as told by your health care provider. This is  important. Contact a health care provider if:  Your periods are late, irregular, or painful, or they stop.  You have pelvic pain that does not go away.  You have pressure on your bladder or trouble emptying your bladder completely.  You have pain during sex.  You have any of the following in your abdomen:  A feeling of fullness.  Pressure.  Discomfort.  Pain that does not go away.  Swelling.  You feel generally ill.  You become constipated.  You lose your appetite.  You develop severe acne.  You start to have more body hair and facial hair.  You are gaining weight or losing weight without changing your exercise and eating habits.  You think you may be pregnant. Get help right away if:  You have abdominal pain that is severe or gets worse.  You cannot eat or drink without vomiting.  You suddenly develop a fever.  Your menstrual period is much heavier than usual. This information is not intended to replace advice given to you by your health care provider. Make sure you discuss any questions you have with your health care provider. Document Released: 05/09/2005 Document Revised: 11/27/2015 Document Reviewed: 10/11/2015 Elsevier Interactive Patient Education  2017 [employer]. Pelvic Pain, Female Female pelvic pain can be caused by many different things and start from a variety of places. Pelvic pain refers to pain that is located in the lower half of the abdomen and between your hips. The pain may occur over a short period of time (acute) or may be reoccurring (chronic). The cause of pelvic pain may be related to disorders affecting the female reproductive organs (gynecologic), but it may also be related to the bladder, kidney stones, an intestinal complication, or muscle or skeletal problems. Getting help right away for pelvic pain is important, especially if there has been severe, sharp, or a sudden onset of unusual pain. It is also important to get help right away  because some types of pelvic pain can be life threatening.  CAUSES  Below are only some of the causes of pelvic pain. The causes of pelvic pain can be in one of several categories.   Gynecologic.  Pelvic inflammatory disease.  Sexually transmitted infection.  Ovarian cyst or a twisted ovarian ligament (ovarian torsion).  Uterine lining that grows outside the uterus (endometriosis).  Fibroids, cysts, or tumors.  Ovulation.  Pregnancy.  Pregnancy that occurs outside the uterus (ectopic pregnancy).  Miscarriage.  Labor.  Abruption of the placenta or ruptured uterus.  Infection.  Uterine infection (endometritis).  Bladder infection.  Diverticulitis.  Miscarriage related to a uterine infection (septic abortion).  Bladder.  Inflammation of the bladder (cystitis).  Kidney stone(s).  Gastrointestinal.  Constipation.  Diverticulitis.  Neurologic.  Trauma.  Feeling pelvic pain because of mental or emotional causes (psychosomatic).  Cancers of the bowel or pelvis. EVALUATION  Your caregiver will want to take a careful history of your concerns. This includes recent changes in your health, a careful gynecologic history of your periods (menses), and a sexual history. Obtaining your family history and medical history is also important. Your caregiver may suggest a pelvic  exam. A pelvic exam will help identify the location and severity of the pain. It also helps in the evaluation of which organ system may be involved. In order to identify the cause of the pelvic pain and be properly treated, your caregiver may order tests. These tests may include:   A pregnancy test.  Pelvic ultrasonography.  An X-ray exam of the abdomen.  A urinalysis or evaluation of vaginal discharge.  Blood tests. HOME CARE INSTRUCTIONS   Only take over-the-counter or prescription medicines for pain, discomfort, or fever as directed by your caregiver.   Rest as directed by your caregiver.    Eat a balanced diet.   Drink enough fluids to make your urine clear or pale yellow, or as directed.   Avoid sexual intercourse if it causes pain.   Apply warm or cold compresses to the lower abdomen depending on which one helps the pain.   Avoid stressful situations.   Keep a journal of your pelvic pain. Write down when it started, where the pain is located, and if there are things that seem to be associated with the pain, such as food or your menstrual cycle.  Follow up with your caregiver as directed.  SEEK MEDICAL CARE IF:  Your medicine does not help your pain.  You have abnormal vaginal discharge. SEEK IMMEDIATE MEDICAL CARE IF:   You have heavy bleeding from the vagina.   Your pelvic pain increases.   You feel light-headed or faint.   You have chills.   You have pain with urination or blood in your urine.   You have uncontrolled diarrhea or vomiting.   You have a fever or persistent symptoms for more than 3 days.  You have a fever and your symptoms suddenly get worse.   You are being physically or sexually abused. This information is not intended to replace advice given to you by your health care provider. Make sure you discuss any questions you have with your health care provider. Document Released: 04/05/2004 Document Revised: 01/28/2015 Document Reviewed: 02/27/2015 Elsevier Interactive Patient Education  2017 [employer].

## 2016-04-23 LAB — WET PREP, GENITAL
Clue Cells Wet Prep HPF POC: NONE SEEN
Trich, Wet Prep: NONE SEEN
YEAST WET PREP: NONE SEEN

## 2016-04-25 LAB — GC/CHLAMYDIA PROBE AMP (~~LOC~~) NOT AT ARMC
CHLAMYDIA, DNA PROBE: NEGATIVE
Neisseria Gonorrhea: NEGATIVE

## 2016-04-28 ENCOUNTER — Ambulatory Visit (HOSPITAL_COMMUNITY): Admission: RE | Admit: 2016-04-28 | Payer: Medicaid Other | Source: Ambulatory Visit

## 2017-01-11 ENCOUNTER — Encounter: Payer: Self-pay | Admitting: *Deleted

## 2017-01-11 ENCOUNTER — Ambulatory Visit: Payer: Medicaid Other | Admitting: Obstetrics & Gynecology

## 2017-01-11 NOTE — Progress Notes (Signed)
Ariana Young did not keep her scheduled appointment for a pap smear.No need to call patient, may reschedule if she call.

## 2017-02-23 ENCOUNTER — Ambulatory Visit (HOSPITAL_COMMUNITY)
Admission: EM | Admit: 2017-02-23 | Discharge: 2017-02-23 | Disposition: A | Discharge status: Home / Self Care | Payer: Medicaid Other | Attending: Family Medicine | Admitting: Family Medicine

## 2017-02-23 DIAGNOSIS — R0602 Shortness of breath: Secondary | ICD-10-CM | POA: Diagnosis not present

## 2017-02-23 DIAGNOSIS — R062 Wheezing: Secondary | ICD-10-CM

## 2017-02-23 DIAGNOSIS — J4541 Moderate persistent asthma with (acute) exacerbation: Secondary | ICD-10-CM | POA: Diagnosis not present

## 2017-02-23 MED ORDER — IPRATROPIUM-ALBUTEROL 0.5-2.5 (3) MG/3ML IN SOLN
3.0000 mL | Freq: Once | RESPIRATORY_TRACT | Status: AC
  Administered 2017-02-23: 3 mL via RESPIRATORY_TRACT

## 2017-02-23 MED ORDER — ALBUTEROL SULFATE HFA 108 (90 BASE) MCG/ACT IN AERS: 1.0000 | INHALATION_SPRAY | Freq: Four times a day (QID) | RESPIRATORY_TRACT | 0 refills | Status: DC | PRN

## 2017-02-23 MED ORDER — PREDNISONE 10 MG (21) PO TBPK: ORAL_TABLET | Freq: Every day | ORAL | 0 refills | Status: DC

## 2017-02-23 MED ORDER — METHYLPREDNISOLONE SODIUM SUCC 125 MG IJ SOLR
125.0000 mg | Freq: Once | INTRAMUSCULAR | Status: AC
  Administered 2017-02-23: 125 mg via INTRAMUSCULAR

## 2017-02-23 MED ORDER — ONDANSETRON 4 MG PO TBDP
8.0000 mg | ORAL_TABLET | Freq: Once | ORAL | Status: AC
  Administered 2017-02-23: 8 mg via ORAL

## 2017-02-23 MED ORDER — IPRATROPIUM-ALBUTEROL 0.5-2.5 (3) MG/3ML IN SOLN: 3.0000 mL | Freq: Once | RESPIRATORY_TRACT | Status: DC

## 2017-02-23 MED ORDER — METHYLPREDNISOLONE SODIUM SUCC 125 MG IJ SOLR
INTRAMUSCULAR | Status: AC
Start: 2017-02-23 — End: 2017-02-23
  Filled 2017-02-23: qty 2

## 2017-02-23 MED ORDER — IPRATROPIUM-ALBUTEROL 0.5-2.5 (3) MG/3ML IN SOLN
RESPIRATORY_TRACT | Status: AC
  Filled 2017-02-23: qty 3

## 2017-02-23 MED ORDER — ONDANSETRON 4 MG PO TBDP
ORAL_TABLET | ORAL | Status: AC
  Filled 2017-02-23: qty 2

## 2017-02-23 NOTE — ED Triage Notes (Addendum)
Pt brought back to room with severe SOB, crying, coughing, shaking, and vomiting.  Provider called to the room immediately and nebulizer treatment started.  Pt reports this started this morning and she has used her rescue inhaler 6 times with no relief.

## 2017-02-23 NOTE — ED Provider Notes (Signed)
[facility]    CSN: [id number] Arrival date & time: 02/23/17  1057     History   Chief Complaint Chief Complaint  Patient presents with  . Shortness of Breath    HPI Ariana Young is a 31 y.o. female.   The history is provided by the patient.  Shortness of Breath  Severity:  Moderate Onset quality:  Sudden Progression:  Worsening Chronicity:  Recurrent Worsened by:  Deep breathing Ineffective treatments:  Inhaler Associated symptoms: vomiting (Reports vomit once as a result of unable to breath)   Associated symptoms: no chest pain, no cough, no fever and no wheezing    31 y/o female with PMH of Asthma presented to clinic with CC of acute onset of difficulty breathing, chest tightness, SOB. Pt anxious, breathing through mouth, shaking when examined. Diffuse expiratory wheeze noted to auscultation. Pt reports taking Albuterol Inhaler with no relief in Sx. SPO2 100% on room air. Past Medical History:  Diagnosis Date  . Asthma    uses inhaler as needed  . 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   Past Surgical History:  Procedure Laterality Date  . OVARIAN CYST SURGERY  2008   ELAP, removal of bilateral dermoid cysts  . WISDOM TOOTH EXTRACTION      OB History    Gravida Para Term Preterm AB Living   1 1 1  0 0 1   SAB TAB Ectopic Multiple Live Births   0 0 0 0 1       Home Medications    Prior to Admission medications   Medication Sig Start Date End Date Taking? Authorizing Provider  albuterol (PROVENTIL HFA;VENTOLIN HFA) 108 (90 Base) MCG/ACT inhaler Inhale 1-2 puffs into the lungs every 6 (six) hours as needed for wheezing or shortness of breath. 02/23/17   Arnice Vanepps, NP  EPINEPHrine (EPIPEN) 0.3 mg/0.3 mL DEVI Inject 0.3 mg into the muscle once as needed (for allergic reaction).     [provider]  hydrocortisone 1 % ointment Apply 1 application topically daily.  05/20/15   Truett Mainland, DO  naproxen sodium (ANAPROX) 550 MG tablet Take 1 tablet (550 mg total) by mouth 2 (two) times daily with a meal. As needed for pain 04/22/16   Anyanwu, Ugonna A, MD  predniSONE (STERAPRED UNI-PAK 21 TAB) 10 MG (21) TBPK tablet Take by mouth daily. Take 6 tabs by mouth daily  for 1 day, then 5 tabs for 1 day, then 4 tabs for 1 day, then 3 tabs for 1 day, 2 tabs for 1 day, then 1 tab by mouth daily for 1 day 02/23/17   Teola Bradley, NP    Family History Family History  Problem Relation Age of Onset  . Diabetes Mother   . Hypertension Mother   . Heart disease Mother     Social History Social History  Substance Use Topics  . Smoking status: Current Every Day Smoker    Packs/day: 0.50    Types: Cigarettes    Last attempt to quit: 05/30/2014  . Smokeless tobacco: Never Used  . Alcohol use No     Allergies   Peanut-containing drug products and Peanuts [peanut oil]   Review of Systems Review of Systems  Constitutional: Negative for activity change, chills, fatigue and fever.  HENT: Negative for congestion.   Respiratory: Positive for shortness of breath. Negative for cough and wheezing.   Cardiovascular: Negative  for chest pain and palpitations.  Gastrointestinal: Positive for vomiting (Reports vomit once as a result of unable to breath).  Psychiatric/Behavioral: Positive for agitation (Anxious, in tears secondary to unable to breath).     Physical Exam Triage Vital Signs ED Triage Vitals [02/23/17 1122]  Enc Vitals Group     BP (!) 146/96     Pulse Rate 93     Resp      Temp      Temp src      SpO2 100 %     Weight      Height      Head Circumference      Peak Flow      Pain Score      Pain Loc      Pain Edu?      Excl. in [place]?    No data found.   Updated Vital Signs BP (!) 146/96 (BP Location: Left Arm)   Pulse 93   SpO2 100%   Visual Acuity Right Eye Distance:   Left Eye Distance:   Bilateral Distance:    Right Eye  Near:   Left Eye Near:    Bilateral Near:     Physical Exam  Constitutional: She is oriented to person, place, and time. She appears well-developed and well-nourished. She appears distressed (Anxious, shaking , in tears secondary to unable to breath ).  HENT:  Head: Normocephalic.  Eyes: Pupils are equal, round, and reactive to light. EOM are normal.  Neck: Normal range of motion.  Cardiovascular: Regular rhythm and normal heart sounds.   Pulmonary/Chest: She has wheezes (Diffuse expiratory wheeze. No rales, ronchii). She exhibits tenderness.  Neurological: She is alert and oriented to person, place, and time.  Skin: Skin is warm. She is not diaphoretic.     UC Treatments / Results  Labs (all labs ordered are listed, but only abnormal results are displayed) Labs Reviewed - No data to display  EKG  EKG Interpretation None       Radiology No results found.  Procedures Procedures (including critical care time)  Medications Ordered in UC Medications  ipratropium-albuterol (DUONEB) 0.5-2.5 (3) MG/3ML nebulizer solution 3 mL (not administered)  methylPREDNISolone sodium succinate (SOLU-MEDROL) 125 mg/2 mL injection 125 mg (125 mg Intramuscular Given 02/23/17 1143)     Initial Impression / Assessment and Plan / UC Course  I have reviewed the triage vital signs and the nursing notes.  Pertinent labs & imaging results that were available during my care of the patient were reviewed by me and considered in my medical decision making (see chart for details).   Breathing and Wheezing dramatically improved post Neb Tx  Final Clinical Impressions(s) / UC Diagnoses   Final diagnoses:  Moderate persistent asthma with acute exacerbation  Wheezing    New Prescriptions New Prescriptions   ALBUTEROL (PROVENTIL HFA;VENTOLIN HFA) 108 (90 BASE) MCG/ACT INHALER    Inhale 1-2 puffs into the lungs every 6 (six) hours as needed for wheezing or shortness of breath.   PREDNISONE  (STERAPRED UNI-PAK 21 TAB) 10 MG (21) TBPK TABLET    Take by mouth daily. Take 6 tabs by mouth daily  for 1 day, then 5 tabs for 1 day, then 4 tabs for 1 day, then 3 tabs for 1 day, 2 tabs for 1 day, then 1 tab by mouth daily for 1 day     Controlled Substance Prescriptions [place] Controlled Substance Registry consulted? No   Gracee Ratterree, NP 02/23/17 1149

## 2017-02-23 NOTE — Discharge Instructions (Signed)
Continue Albuterol and Course of Oral prednisone as prescribed. If Sx worsens return to clinic for re evaluation

## 2018-01-18 ENCOUNTER — Emergency Department (HOSPITAL_COMMUNITY)
Admission: EM | Admit: 2018-01-18 | Discharge: 2018-01-18 | Disposition: A | Discharge status: Home / Self Care | Payer: Medicaid Other | Attending: Emergency Medicine | Admitting: Emergency Medicine

## 2018-01-18 ENCOUNTER — Other Ambulatory Visit: Payer: Self-pay

## 2018-01-18 ENCOUNTER — Encounter (HOSPITAL_COMMUNITY): Payer: Self-pay | Admitting: Emergency Medicine

## 2018-01-18 DIAGNOSIS — Z91010 Allergy to peanuts: Secondary | ICD-10-CM | POA: Insufficient documentation

## 2018-01-18 DIAGNOSIS — F17210 Nicotine dependence, cigarettes, uncomplicated: Secondary | ICD-10-CM | POA: Insufficient documentation

## 2018-01-18 DIAGNOSIS — T7840XA Allergy, unspecified, initial encounter: Secondary | ICD-10-CM | POA: Insufficient documentation

## 2018-01-18 DIAGNOSIS — J45909 Unspecified asthma, uncomplicated: Secondary | ICD-10-CM | POA: Insufficient documentation

## 2018-01-18 MED ORDER — METHYLPREDNISOLONE SODIUM SUCC 125 MG IJ SOLR
125.0000 mg | Freq: Once | INTRAMUSCULAR | Status: AC
Start: 2018-01-18 — End: 2018-01-18
  Administered 2018-01-18: 125 mg via INTRAVENOUS
  Filled 2018-01-18: qty 2

## 2018-01-18 NOTE — ED Provider Notes (Signed)
[facility] DEPT Provider Note   CSN: [id number] Arrival date & time: 01/18/18  1339     History   Chief Complaint Chief Complaint  Patient presents with  . Allergic Reaction    HPI Ariana Young is a 32 y.o. female.  32 year old female presents after being exposed to peanuts which she has an allergy to.  Patient states that she took a bite of a donut that peanuts on it and she began to have tingling and swelling of her left tongue.  She was given a dose of epinephrine at work.  EMS was called and she was given Benadryl and she feels back to her baseline at this time.  Did have some itching of the back of her neck which is since resolved.  Denies any rashes.  No trouble swallowing.     Past Medical History:  Diagnosis Date  . Asthma    uses inhaler as needed  . 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   Past Surgical History:  Procedure Laterality Date  . OVARIAN CYST SURGERY  2008   ELAP, removal of bilateral dermoid cysts  . WISDOM TOOTH EXTRACTION       OB History    Gravida  1   Para  1   Term  1   Preterm  0   AB  0   Living  1     SAB  0   TAB  0   Ectopic  0   Multiple  0   Live Births  1            Home Medications    Prior to Admission medications   Medication Sig Start Date End Date Taking? Authorizing Provider  albuterol (PROVENTIL HFA;VENTOLIN HFA) 108 (90 Base) MCG/ACT inhaler Inhale 1-2 puffs into the lungs every 6 (six) hours as needed for wheezing or shortness of breath. 02/23/17   Multani, Bhupinder, NP  EPINEPHrine (EPIPEN) 0.3 mg/0.3 mL DEVI Inject 0.3 mg into the muscle once as needed (for allergic reaction).     [provider]  hydrocortisone 1 % ointment Apply 1 application topically daily. 05/20/15   Truett Mainland, DO  naproxen sodium (ANAPROX) 550 MG tablet Take 1 tablet (550 mg total) by mouth 2 (two) times daily  with a meal. As needed for pain 04/22/16   Anyanwu, Ugonna A, MD  predniSONE (STERAPRED UNI-PAK 21 TAB) 10 MG (21) TBPK tablet Take by mouth daily. Take 6 tabs by mouth daily  for 1 day, then 5 tabs for 1 day, then 4 tabs for 1 day, then 3 tabs for 1 day, 2 tabs for 1 day, then 1 tab by mouth daily for 1 day 02/23/17   Teola Bradley, NP    Family History Family History  Problem Relation Age of Onset  . Diabetes Mother   . Hypertension Mother   . Heart disease Mother     Social History Social History   Tobacco Use  . Smoking status: Current Every Day Smoker    Packs/day: 0.50    Types: Cigarettes    Last attempt to quit: 05/30/2014    Years since quitting: 3.6  . Smokeless tobacco: Never Used  Substance Use Topics  . Alcohol use: No  . Drug use: No    Frequency: 5.0 times per week    Types: Marijuana     Allergies   Peanut-containing  drug products and Peanuts [peanut oil]   Review of Systems Review of Systems  All other systems reviewed and are negative.    Physical Exam Updated Vital Signs SpO2 98% Comment: RA  Physical Exam  Constitutional: She is oriented to person, place, and time. She appears well-developed and well-nourished.  Non-toxic appearance. No distress.  HENT:  Head: Normocephalic and atraumatic.  Eyes: Pupils are equal, round, and reactive to light. Conjunctivae, EOM and lids are normal.  Neck: Normal range of motion. Neck supple. No tracheal deviation present. No thyroid mass present.  Cardiovascular: Normal rate, regular rhythm and normal heart sounds. Exam reveals no gallop.  No murmur heard. Pulmonary/Chest: Effort normal and breath sounds normal. No stridor. No respiratory distress. She has no decreased breath sounds. She has no wheezes. She has no rhonchi. She has no rales.  Abdominal: Soft. Normal appearance and bowel sounds are normal. She exhibits no distension. There is no tenderness. There is no rebound and no CVA tenderness.    Musculoskeletal: Normal range of motion. She exhibits no edema or tenderness.  Neurological: She is alert and oriented to person, place, and time. She has normal strength. No cranial nerve deficit or sensory deficit. GCS eye subscore is 4. GCS verbal subscore is 5. GCS motor subscore is 6.  Skin: Skin is warm and dry. No abrasion and no rash noted.  Psychiatric: She has a normal mood and affect. Her speech is normal and behavior is normal.  Nursing note and vitals reviewed.    ED Treatments / Results  Labs (all labs ordered are listed, but only abnormal results are displayed) Labs Reviewed - No data to display  EKG None  Radiology No results found.  Procedures Procedures (including critical care time)  Medications Ordered in ED Medications  methylPREDNISolone sodium succinate (SOLU-MEDROL) 125 mg/2 mL injection 125 mg (has no administration in time range)     Initial Impression / Assessment and Plan / ED Course  I have reviewed the triage vital signs and the nursing notes.  Pertinent labs & imaging results that were available during my care of the patient were reviewed by me and considered in my medical decision making (see chart for details).     Given Solu-Medrol and monitored here and no further reactions.  Stable for discharge and she has an epinephrine pen  Final Clinical Impressions(s) / ED Diagnoses   Final diagnoses:  None    ED Discharge Orders    None       Lacretia Leigh, MD 01/18/18 1513

## 2018-01-18 NOTE — ED Triage Notes (Signed)
Patient brought in from work for allergic reaction to a doughnut that containing peanut butter. 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 Pt tongue began to swell and feel dizzy. Pt spit doghnut out and tried to vomit up what was swallowed. Pt given 0.3 epi and 25 mg benadryl by RN at work place. Pt took one puff from her inhaler. EMS gave additional 25mg  benadryl. Pt reports dizziness has resolved.

## 2018-01-18 NOTE — ED Notes (Signed)
Bed: WA09 Expected date:  Expected time:  Means of arrival:  Comments: EMS allergic reaction, Epi en route

## 2018-03-18 ENCOUNTER — Emergency Department (HOSPITAL_COMMUNITY): Payer: Self-pay

## 2018-03-18 ENCOUNTER — Other Ambulatory Visit: Payer: Self-pay

## 2018-03-18 ENCOUNTER — Encounter (HOSPITAL_COMMUNITY): Payer: Self-pay | Admitting: *Deleted

## 2018-03-18 ENCOUNTER — Emergency Department (HOSPITAL_COMMUNITY)
Admission: EM | Admit: 2018-03-18 | Discharge: 2018-03-18 | Disposition: A | Discharge status: Home / Self Care | Payer: Self-pay | Attending: Emergency Medicine | Admitting: Emergency Medicine

## 2018-03-18 DIAGNOSIS — K2970 Gastritis, unspecified, without bleeding: Secondary | ICD-10-CM | POA: Insufficient documentation

## 2018-03-18 DIAGNOSIS — J45909 Unspecified asthma, uncomplicated: Secondary | ICD-10-CM | POA: Insufficient documentation

## 2018-03-18 DIAGNOSIS — Z79899 Other long term (current) drug therapy: Secondary | ICD-10-CM | POA: Insufficient documentation

## 2018-03-18 DIAGNOSIS — Z91010 Allergy to peanuts: Secondary | ICD-10-CM | POA: Insufficient documentation

## 2018-03-18 DIAGNOSIS — Z87891 Personal history of nicotine dependence: Secondary | ICD-10-CM | POA: Insufficient documentation

## 2018-03-18 LAB — COMPREHENSIVE METABOLIC PANEL
ALK PHOS: 54 U/L (ref 38–126)
ALT: 14 U/L (ref 0–44)
AST: 19 U/L (ref 15–41)
Albumin: 4.3 g/dL (ref 3.5–5.0)
Anion gap: 6 (ref 5–15)
BUN: 11 mg/dL (ref 6–20)
CALCIUM: 8.9 mg/dL (ref 8.9–10.3)
CO2: 26 mmol/L (ref 22–32)
CREATININE: 0.84 mg/dL (ref 0.44–1.00)
Chloride: 109 mmol/L (ref 98–111)
Glucose, Bld: 99 mg/dL (ref 70–99)
Potassium: 3.8 mmol/L (ref 3.5–5.1)
Sodium: 141 mmol/L (ref 135–145)
Total Bilirubin: 0.6 mg/dL (ref 0.3–1.2)
Total Protein: 7.9 g/dL (ref 6.5–8.1)

## 2018-03-18 LAB — CBC
HCT: 37.1 % (ref 36.0–46.0)
Hemoglobin: 12.3 g/dL (ref 12.0–15.0)
MCH: 31.5 pg (ref 26.0–34.0)
MCHC: 33.2 g/dL (ref 30.0–36.0)
MCV: 94.9 fL (ref 80.0–100.0)
PLATELETS: 207 10*3/uL (ref 150–400)
RBC: 3.91 MIL/uL (ref 3.87–5.11)
RDW: 12.4 % (ref 11.5–15.5)
WBC: 6.7 10*3/uL (ref 4.0–10.5)
nRBC: 0 % (ref 0.0–0.2)

## 2018-03-18 LAB — LIPASE, BLOOD: LIPASE: 35 U/L (ref 11–51)

## 2018-03-18 LAB — I-STAT BETA HCG BLOOD, ED (MC, WL, AP ONLY): I-stat hCG, quantitative: <5 m[IU]/mL (ref <5)

## 2018-03-18 MED ORDER — FAMOTIDINE 20 MG PO TABS: 20.0000 mg | ORAL_TABLET | Freq: Two times a day (BID) | ORAL | 0 refills | Status: DC

## 2018-03-18 MED ORDER — ONDANSETRON 4 MG PO TBDP: 4.0000 mg | ORAL_TABLET | Freq: Three times a day (TID) | ORAL | 0 refills | Status: DC | PRN

## 2018-03-18 MED ORDER — FAMOTIDINE IN NACL 20-0.9 MG/50ML-% IV SOLN
20.0000 mg | Freq: Once | INTRAVENOUS | Status: AC
  Administered 2018-03-18: 20 mg via INTRAVENOUS
  Filled 2018-03-18: qty 50

## 2018-03-18 MED ORDER — SODIUM CHLORIDE 0.9 % IV BOLUS
1000.0000 mL | Freq: Once | INTRAVENOUS | Status: AC
  Administered 2018-03-18: 1000 mL via INTRAVENOUS

## 2018-03-18 MED ORDER — KETOROLAC TROMETHAMINE 30 MG/ML IJ SOLN
30.0000 mg | Freq: Once | INTRAMUSCULAR | Status: AC
  Administered 2018-03-18: 30 mg via INTRAVENOUS
  Filled 2018-03-18: qty 1

## 2018-03-18 MED ORDER — ONDANSETRON 4 MG PO TBDP
4.0000 mg | ORAL_TABLET | Freq: Once | ORAL | Status: AC | PRN
  Administered 2018-03-18: 4 mg via ORAL
  Filled 2018-03-18: qty 1

## 2018-03-18 NOTE — Discharge Instructions (Signed)
Return to ED for worsening symptoms, severe abdominal pain or chest pain, vomiting or coughing up blood, lightheadedness or loss of consciousness.

## 2018-03-18 NOTE — ED Provider Notes (Signed)
[facility] DEPT Provider Note   CSN: [id number] Arrival date & time: 03/18/18  1139     History   Chief Complaint Chief Complaint  Patient presents with  . Emesis  . Abdominal Pain  . Headache    HPI Ariana Young is a 32 y.o. female with a past medical history of asthma, who presents to ED for evaluation of nausea, several episodes of nonbloody, nonbilious emesis, headache that began last night.  She reports having URI symptoms including cough productive with mucus, rhinorrhea for the past 3 days that has resolved with use of her inhaler. States that last night, she was drinking (2 Coronas and some Hennessey). She was around tobacco smokers, although she herself quit smoking 1 week ago. She is unsure if the fumes from the smoking is what caused her headache. She woke up this morning with bodyaches and vomiting. She ate a sandwich from [employer] last night and has not eaten breakfast this morning.  She has not taken any medications to help with her symptoms prior to arrival.  She endorses occasional marijuana use but none recently.  Denies any prior abdominal surgeries, chest pain, shortness of breath, fever, neck pain, injuries or falls, urinary symptoms, vaginal complaints or prior abdominal surgeries. Denies possibility of pregnancy.  HPI  Past Medical History:  Diagnosis Date  . Asthma    uses inhaler as needed  . 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   Past Surgical History:  Procedure Laterality Date  . OVARIAN CYST SURGERY  2008   ELAP, removal of bilateral dermoid cysts  . WISDOM TOOTH EXTRACTION       OB History    Gravida  1   Para  1   Term  1   Preterm  0   AB  0   Living  1     SAB  0   TAB  0   Ectopic  0   Multiple  0   Live Births  1            Home Medications    Prior to Admission medications   Medication Sig Start Date End Date  Taking? Authorizing Provider  albuterol (PROVENTIL HFA;VENTOLIN HFA) 108 (90 Base) MCG/ACT inhaler Inhale 1-2 puffs into the lungs every 6 (six) hours as needed for wheezing or shortness of breath. 02/23/17  Yes Multani, Bhupinder, NP  hydrocortisone 1 % ointment Apply 1 application topically daily. 05/20/15  Yes Truett Mainland, DO  IRON PO Take 1 tablet by mouth daily.   Yes [provider]  EPINEPHrine (EPIPEN) 0.3 mg/0.3 mL DEVI Inject 0.3 mg into the muscle once as needed (for allergic reaction).     [provider]  famotidine (PEPCID) 20 MG tablet Take 1 tablet (20 mg total) by mouth 2 (two) times daily. 03/18/18   Lashanda Storlie, PA-C  naproxen sodium (ANAPROX) 550 MG tablet Take 1 tablet (550 mg total) by mouth 2 (two) times daily with a meal. As needed for pain Patient not taking: Reported on 01/18/2018 04/22/16   Anyanwu, Sallyanne Havers, MD  ondansetron (ZOFRAN ODT) 4 MG disintegrating tablet Take 1 tablet (4 mg total) by mouth every 8 (eight) hours as needed for nausea or vomiting. 03/18/18   Larina Lieurance, PA-C  predniSONE (STERAPRED UNI-PAK 21 TAB) 10 MG (21) TBPK tablet Take by mouth daily. Take 6 tabs by mouth daily  for 1 day, then 5 tabs for 1 day, then 4 tabs for 1 day, then 3 tabs for 1 day, 2 tabs for 1 day, then 1 tab by mouth daily for 1 day Patient not taking: Reported on 01/18/2018 02/23/17   Teola Bradley, NP    Family History Family History  Problem Relation Age of Onset  . Diabetes Mother   . Hypertension Mother   . Heart disease Mother     Social History Social History   Tobacco Use  . Smoking status: Former Smoker    Packs/day: 0.50    Types: Cigarettes    Last attempt to quit: 06/11/2017    Years since quitting: 0.7  . Smokeless tobacco: Never Used  Substance Use Topics  . Alcohol use: No  . Drug use: Yes    Frequency: 5.0 times per week    Types: Marijuana     Allergies   Peanut-containing drug products and Peanuts [peanut  oil]   Review of Systems Review of Systems  Constitutional: Negative for appetite change, chills and fever.  HENT: Positive for congestion. Negative for ear pain, rhinorrhea, sneezing and sore throat.   Eyes: Negative for photophobia and visual disturbance.  Respiratory: Positive for cough. Negative for chest tightness, shortness of breath and wheezing.   Cardiovascular: Negative for chest pain and palpitations.  Gastrointestinal: Positive for abdominal pain, nausea and vomiting. Negative for blood in stool, constipation and diarrhea.  Genitourinary: Negative for dysuria, hematuria and urgency.  Musculoskeletal: Positive for myalgias. Negative for neck pain.  Skin: Negative for rash.  Neurological: Positive for headaches. Negative for dizziness, weakness and light-headedness.     Physical Exam Updated Vital Signs BP (!) 140/96   Pulse 72   Temp 98.3 F (36.8 C) (Oral)   Resp 16   Ht 5\' 6"  (1.676 m)   Wt 68 kg   LMP 03/11/2018   SpO2 100%   BMI 24.21 kg/m   Physical Exam  Constitutional: She is oriented to person, place, and time. She appears well-developed and well-nourished. No distress.  HENT:  Head: Normocephalic and atraumatic.  Nose: Nose normal.  Eyes: Pupils are equal, round, and reactive to light. Conjunctivae and EOM are normal. Right eye exhibits no discharge. Left eye exhibits no discharge. No scleral icterus.  Neck: Normal range of motion. Neck supple.  No meningismus.  Cardiovascular: Normal rate, regular rhythm, normal heart sounds and intact distal pulses. Exam reveals no gallop and no friction rub.  No murmur heard. Pulmonary/Chest: Effort normal and breath sounds normal. No respiratory distress.  Abdominal: Soft. Bowel sounds are normal. She exhibits no distension. There is tenderness in the epigastric area and periumbilical area. There is no rebound, no guarding and no CVA tenderness.  Musculoskeletal: Normal range of motion. She exhibits no edema.   Neurological: She is alert and oriented to person, place, and time. No cranial nerve deficit or sensory deficit. She exhibits normal muscle tone. Coordination normal.  Skin: Skin is warm and dry. No rash noted.  Psychiatric: She has a normal mood and affect.  Nursing note and vitals reviewed.    ED Treatments / Results  Labs (all labs ordered are listed, but only abnormal results are displayed) Labs Reviewed  LIPASE, BLOOD  COMPREHENSIVE METABOLIC PANEL  CBC  I-STAT BETA HCG BLOOD, ED ([facility], [facility], [facility] ONLY)    EKG None  Radiology Dg Chest 2 View  Result Date: 03/18/2018 CLINICAL DATA:  Vomiting and headache since this morning. EXAM: CHEST - 2  VIEW COMPARISON:  03/08/2016 FINDINGS: The heart size and mediastinal contours are within normal limits. Both lungs are clear. The visualized skeletal structures are unremarkable. IMPRESSION: Normal chest x-ray. Electronically Signed   By: Marijo Sanes M.D.   On: 03/18/2018 13:50    Procedures Procedures (including critical care time)  Medications Ordered in ED Medications  ondansetron (ZOFRAN-ODT) disintegrating tablet 4 mg (4 mg Oral Given 03/18/18 1203)  sodium chloride 0.9 % bolus 1,000 mL (0 mLs Intravenous Stopped 03/18/18 1420)  famotidine (PEPCID) IVPB 20 mg premix (0 mg Intravenous Stopped 03/18/18 1357)  ketorolac (TORADOL) 30 MG/ML injection 30 mg (30 mg Intravenous Given 03/18/18 1419)     Initial Impression / Assessment and Plan / ED Course  I have reviewed the triage vital signs and the nursing notes.  Pertinent labs & imaging results that were available during my care of the patient were reviewed by me and considered in my medical decision making (see chart for details).  Clinical Course as of Mar 18 1512  Sun Mar 18, 2018  1405 Patient reports improvement in her symptoms with medications and fluids given.   [HK]  6734 Awaiting reassessment after Toradol.   [HK]    Clinical Course User Index [HK] Delia Heady,  PA-C    32 year old female with a past medical history of asthma presents to ED for nausea, several episodes of nonbloody, nonbilious emesis, headache.  She does admit to alcohol use last night.  She quit tobacco use 1 week ago but states that she was in close vicinity of smokers last night and believes this is what caused her headache.  She woke up this morning with the nausea and vomiting.  States that "I do not know if it is a hangover because I did not even drink that much."  She has mild tenderness palpation of the epigastrium and periumbilical area without rebound or guarding noted.  Lungs are clear to auscultation bilaterally.  No CVA tenderness or urinary symptoms noted.  She is afebrile with no history of fever.  Other vital signs are within normal limits.  Lab work including CBC, CMP, lipase and hCG are unremarkable.  Chest x-ray is negative.  Patient given fluids, Toradol, Pepcid and Zofran with significant improvement in her symptoms.  Repeat abdominal exam is improved.  Suspect that symptoms are viral in nature.  There are no headache characteristics that are lateralizing or concerning for increased ICP, infectious or vascular cause of her symptoms.  Doubt appendicitis, cholecystitis or other surgical or emergent cause of her abdominal pain.  Encouraged her to slowly advance her diet and to increase p.o. intake as tolerated.  Will give as needed Zofran and Pepcid.  Advised to return to ED for any severe worsening symptoms.  Portions of this note were generated with [employer]. Dictation errors may occur despite best attempts at proofreading.   Final Clinical Impressions(s) / ED Diagnoses   Final diagnoses:  Gastritis without bleeding, unspecified chronicity, unspecified gastritis type    ED Discharge Orders         Ordered    ondansetron (ZOFRAN ODT) 4 MG disintegrating tablet  Every 8 hours PRN     03/18/18 1512    famotidine (PEPCID) 20 MG tablet  2 times daily      03/18/18 1512           Delia Heady, PA-C 03/18/18 1514    Lacretia Leigh, MD 03/21/18 1549

## 2018-03-18 NOTE — ED Triage Notes (Signed)
Pt woke about 0630 with vomiting and headach, reports stomach cramps as well.

## 2018-05-15 ENCOUNTER — Encounter (HOSPITAL_COMMUNITY): Payer: Self-pay | Admitting: Emergency Medicine

## 2018-05-15 ENCOUNTER — Emergency Department (HOSPITAL_COMMUNITY): Payer: Self-pay

## 2018-05-15 ENCOUNTER — Emergency Department (HOSPITAL_COMMUNITY)
Admission: EM | Admit: 2018-05-15 | Discharge: 2018-05-15 | Disposition: A | Discharge status: Home / Self Care | Payer: Self-pay | Attending: Emergency Medicine | Admitting: Emergency Medicine

## 2018-05-15 ENCOUNTER — Other Ambulatory Visit: Payer: Self-pay

## 2018-05-15 DIAGNOSIS — Z91010 Allergy to peanuts: Secondary | ICD-10-CM | POA: Insufficient documentation

## 2018-05-15 DIAGNOSIS — J111 Influenza due to unidentified influenza virus with other respiratory manifestations: Secondary | ICD-10-CM | POA: Insufficient documentation

## 2018-05-15 DIAGNOSIS — J101 Influenza due to other identified influenza virus with other respiratory manifestations: Secondary | ICD-10-CM

## 2018-05-15 DIAGNOSIS — Z87891 Personal history of nicotine dependence: Secondary | ICD-10-CM | POA: Insufficient documentation

## 2018-05-15 DIAGNOSIS — J45909 Unspecified asthma, uncomplicated: Secondary | ICD-10-CM | POA: Insufficient documentation

## 2018-05-15 DIAGNOSIS — R062 Wheezing: Secondary | ICD-10-CM

## 2018-05-15 DIAGNOSIS — Z79899 Other long term (current) drug therapy: Secondary | ICD-10-CM | POA: Insufficient documentation

## 2018-05-15 LAB — I-STAT TROPONIN, ED: Troponin i, poc: 0 ng/mL (ref 0.00–0.08)

## 2018-05-15 LAB — COMPREHENSIVE METABOLIC PANEL
ALBUMIN: 3.8 g/dL (ref 3.5–5.0)
ALK PHOS: 48 U/L (ref 38–126)
ALT: 10 U/L (ref 0–44)
ANION GAP: 8 (ref 5–15)
AST: 17 U/L (ref 15–41)
BILIRUBIN TOTAL: 0.5 mg/dL (ref 0.3–1.2)
BUN: 8 mg/dL (ref 6–20)
CALCIUM: 9 mg/dL (ref 8.9–10.3)
CO2: 21 mmol/L — ABNORMAL LOW (ref 22–32)
Chloride: 109 mmol/L (ref 98–111)
Creatinine, Ser: 1.15 mg/dL — ABNORMAL HIGH (ref 0.44–1.00)
GFR calc Af Amer: >60 mL/min (ref >60)
GFR calc non Af Amer: >60 mL/min (ref >60)
GLUCOSE: 104 mg/dL — AB (ref 70–99)
Potassium: 3.3 mmol/L — ABNORMAL LOW (ref 3.5–5.1)
Sodium: 138 mmol/L (ref 135–145)
TOTAL PROTEIN: 6.8 g/dL (ref 6.5–8.1)

## 2018-05-15 LAB — CBC WITH DIFFERENTIAL/PLATELET
Abs Immature Granulocytes: 0.03 10*3/uL (ref 0.00–0.07)
BASOS PCT: 1 %
Basophils Absolute: 0 10*3/uL (ref 0.0–0.1)
EOS ABS: 0.1 10*3/uL (ref 0.0–0.5)
Eosinophils Relative: 2 %
HCT: 35.7 % — ABNORMAL LOW (ref 36.0–46.0)
Hemoglobin: 11.9 g/dL — ABNORMAL LOW (ref 12.0–15.0)
Immature Granulocytes: 1 %
Lymphocytes Relative: 11 %
Lymphs Abs: 0.5 10*3/uL — ABNORMAL LOW (ref 0.7–4.0)
MCH: 31.2 pg (ref 26.0–34.0)
MCHC: 33.3 g/dL (ref 30.0–36.0)
MCV: 93.7 fL (ref 80.0–100.0)
MONOS PCT: 10 %
Monocytes Absolute: 0.4 10*3/uL (ref 0.1–1.0)
Neutro Abs: 3.3 10*3/uL (ref 1.7–7.7)
Neutrophils Relative %: 75 %
PLATELETS: 184 10*3/uL (ref 150–400)
RBC: 3.81 MIL/uL — AB (ref 3.87–5.11)
RDW: 12 % (ref 11.5–15.5)
WBC: 4.3 10*3/uL (ref 4.0–10.5)
nRBC: 0 % (ref 0.0–0.2)

## 2018-05-15 LAB — INFLUENZA PANEL BY PCR (TYPE A & B)
Influenza A By PCR: NEGATIVE
Influenza B By PCR: POSITIVE — AB

## 2018-05-15 LAB — LIPASE, BLOOD: Lipase: 22 U/L (ref 11–51)

## 2018-05-15 LAB — I-STAT BETA HCG BLOOD, ED (MC, WL, AP ONLY): I-stat hCG, quantitative: <5 m[IU]/mL (ref <5)

## 2018-05-15 LAB — I-STAT CG4 LACTIC ACID, ED
LACTIC ACID, VENOUS: 1 mmol/L (ref 0.5–1.9)
Lactic Acid, Venous: 2.34 mmol/L (ref 0.5–1.9)

## 2018-05-15 MED ORDER — SODIUM CHLORIDE 0.9 % IV BOLUS
500.0000 mL | Freq: Once | INTRAVENOUS | Status: AC
  Administered 2018-05-15: 500 mL via INTRAVENOUS

## 2018-05-15 MED ORDER — SODIUM CHLORIDE 0.9 % IV BOLUS
1000.0000 mL | Freq: Once | INTRAVENOUS | Status: AC
  Administered 2018-05-15: 1000 mL via INTRAVENOUS

## 2018-05-15 MED ORDER — MORPHINE SULFATE (PF) 4 MG/ML IV SOLN
4.0000 mg | Freq: Once | INTRAVENOUS | Status: AC
  Administered 2018-05-15: 4 mg via INTRAVENOUS
  Filled 2018-05-15: qty 1

## 2018-05-15 MED ORDER — ALBUTEROL SULFATE HFA 108 (90 BASE) MCG/ACT IN AERS
2.0000 | INHALATION_SPRAY | Freq: Once | RESPIRATORY_TRACT | Status: AC
  Administered 2018-05-15: 2 via RESPIRATORY_TRACT
  Filled 2018-05-15: qty 6.7

## 2018-05-15 MED ORDER — POTASSIUM CHLORIDE CRYS ER 20 MEQ PO TBCR
20.0000 meq | EXTENDED_RELEASE_TABLET | Freq: Once | ORAL | Status: AC
  Administered 2018-05-15: 20 meq via ORAL
  Filled 2018-05-15: qty 1

## 2018-05-15 MED ORDER — IPRATROPIUM-ALBUTEROL 0.5-2.5 (3) MG/3ML IN SOLN
3.0000 mL | Freq: Once | RESPIRATORY_TRACT | Status: AC
  Administered 2018-05-15: 3 mL via RESPIRATORY_TRACT
  Filled 2018-05-15: qty 3

## 2018-05-15 MED ORDER — BENZONATATE 100 MG PO CAPS: 100.0000 mg | ORAL_CAPSULE | Freq: Three times a day (TID) | ORAL | 0 refills | Status: DC

## 2018-05-15 MED ORDER — OSELTAMIVIR PHOSPHATE 75 MG PO CAPS: 75.0000 mg | ORAL_CAPSULE | Freq: Two times a day (BID) | ORAL | 0 refills | Status: DC

## 2018-05-15 MED ORDER — DEXAMETHASONE 4 MG PO TABS
10.0000 mg | ORAL_TABLET | Freq: Once | ORAL | Status: AC
  Administered 2018-05-15: 10 mg via ORAL
  Filled 2018-05-15: qty 3

## 2018-05-15 MED ORDER — IBUPROFEN 600 MG PO TABS: 600.0000 mg | ORAL_TABLET | Freq: Four times a day (QID) | ORAL | 0 refills | Status: DC | PRN

## 2018-05-15 MED ORDER — ACETAMINOPHEN 500 MG PO TABS
1000.0000 mg | ORAL_TABLET | Freq: Once | ORAL | Status: AC
  Administered 2018-05-15: 1000 mg via ORAL
  Filled 2018-05-15: qty 2

## 2018-05-15 NOTE — ED Notes (Signed)
ED Provider at bedside. 

## 2018-05-15 NOTE — Discharge Instructions (Addendum)
Please read and follow all provided instructions.  You were seen here today for your symptoms. Your diagnosis today is Influenza (Flu).   Take medications as prescribed. Followup with your doctor in regards to your hospital visit. If you do not have a doctor use the resource guide listed below to help he find one. You may return to the emergency department if symptoms worsen, become progressive, or become more concerning. Read below to learn more about your diagnosis & reasons to return.   Take ibuprofen for muscle aches and fever  Take Tamiflu as prescribed. Please note this medication does have side effects and can cause nausea and vomiting.   Albuterol inhaler - this medication will help open up your airway. Use inhaler as follows: 1-2 puffs with spacer every 4 hours as needed for wheezing, cough, or shortness of breath.   You were given Steroids in the department.   Influenza, Adult  Influenza (flu) starts suddenly, usually with a fever. It causes chills, a dry-hacking cough, headache, body aches, and sore throat. Influenza spreads easily from one person to another. Risk of transmission is lowered by getting the flu shot each year.  HOME CARE  Only take medicines as told by your doctor.  Rest.  Drink enough fluids to keep your pee (urine) clear or pale yellow.  Wash your hands often. Do this after you blow your nose, after you go to the bathroom, and before you touch food.  GET HELP RIGHT AWAY IF (return to the ED) You have shortness of breath while resting.  You have pain or pressure in the chest or belly (abdomen).  You suddenly feel dizzy.  You feel confused.  You have a hard time breathing.  Your skin or nails turn bluish in color.  You get a bad neck pain or stiffness.  You get a bad headache, face pain, or earache.  You throw up (vomit) a lot and often.  You have a fever > 101 that persists  Additional Information:  Your vital signs today were: BP 136/80    Pulse (!) 113     Temp 99.4 F (37.4 C) (Oral)    Resp 18    SpO2 99%  If your blood pressure (BP) was elevated above 135/85 this visit, please have this repeated by your doctor within one month. ---------------

## 2018-05-15 NOTE — ED Notes (Signed)
Pt ambulatory throughout hallway without assistance on pulse ox. Oxygen saturation remained at 100% throughout ambulation, gait steady.

## 2018-05-15 NOTE — ED Provider Notes (Signed)
[place] EMERGENCY DEPARTMENT Provider Note   CSN: [id number] Arrival date & time: 05/15/18  1132     History   Chief Complaint Chief Complaint  Patient presents with  . Shortness of Breath  . Chest Pain    HPI Ariana Young is a 32 y.o. female with history of tobacco abuse and asthma who presents emergency department today for fever and shortness of breath.  Patient reports that last night she began having a productive cough with white sputum as well as some shortness of breath.  She tried her albuterol inhaler for this which provided some relief.  She went to bed last night and was awoken this morning by an episode of coughing.  She reports shortly after she became short of breath and had chest tightness along with feeling feverish, chilled and generalized body aches.  She used the last 4 puffs of her inhaler that provided her relief of her chest tightness and shortness of breath and went back to sleep.  She woke up again in a coughing episode and called EMS.  EMS administered 10 mg of albuterol 0.5 mg of Atrovent in route to the hospital.  She reports this improved her chest tightness as well as shortness of breath but still feels some discomfort.  Patient also is noted to be febrile on arrival.  She denies antipyretics prior to arrival.  She denies feeling ill prior to leaving yesterday when her coughing began.  She notes that her niece and nephew have had runny noses and a cough for the last several days and she is unsure if she caught something from them.  Did not receive a flu vaccine this year.  Patient also complains of some nasal congestion, rhinorrhea and abdominal cramping in her lower abdomen.  Patient reports that this morning she also had a generalized headache after coughing.  She reports this is mild in severity and not worst headache of her life.  It was gradual in onset.  She reports is similar to prior headaches.  She denies any associated visual changes,  diplopia, focal weakness, dizziness, vertigo, neck stiffness or rash.  Patient denies any flank pain, upper abdominal pain, exertional chest pain, hemoptysis, lower extremity swelling, recent surgery, recent travel, recent immobilization, exogenous estrogen use, prior PE/DVT, urinary symptoms.  HPI  Past Medical History:  Diagnosis Date  . Asthma    uses inhaler as needed  . 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   Past Surgical History:  Procedure Laterality Date  . OVARIAN CYST SURGERY  2008   ELAP, removal of bilateral dermoid cysts  . WISDOM TOOTH EXTRACTION       OB History    Gravida  1   Para  1   Term  1   Preterm  0   AB  0   Living  1     SAB  0   TAB  0   Ectopic  0   Multiple  0   Live Births  1            Home Medications    Prior to Admission medications   Medication Sig Start Date End Date Taking? Authorizing Provider  albuterol (PROVENTIL HFA;VENTOLIN HFA) 108 (90 Base) MCG/ACT inhaler Inhale 1-2 puffs into the lungs every 6 (six) hours as needed for wheezing or shortness of breath. 02/23/17   Multani, Bhupinder, NP  EPINEPHrine (EPIPEN)  0.3 mg/0.3 mL DEVI Inject 0.3 mg into the muscle once as needed (for allergic reaction).     [provider]  famotidine (PEPCID) 20 MG tablet Take 1 tablet (20 mg total) by mouth 2 (two) times daily. 03/18/18   Khatri, Hina, PA-C  hydrocortisone 1 % ointment Apply 1 application topically daily. 05/20/15   Truett Mainland, DO  IRON PO Take 1 tablet by mouth daily.    [provider]  naproxen sodium (ANAPROX) 550 MG tablet Take 1 tablet (550 mg total) by mouth 2 (two) times daily with a meal. As needed for pain Patient not taking: Reported on 01/18/2018 04/22/16   Anyanwu, Sallyanne Havers, MD  ondansetron (ZOFRAN ODT) 4 MG disintegrating tablet Take 1 tablet (4 mg total) by mouth every 8 (eight) hours as needed for nausea or vomiting.  03/18/18   Khatri, Hina, PA-C  predniSONE (STERAPRED UNI-PAK 21 TAB) 10 MG (21) TBPK tablet Take by mouth daily. Take 6 tabs by mouth daily  for 1 day, then 5 tabs for 1 day, then 4 tabs for 1 day, then 3 tabs for 1 day, 2 tabs for 1 day, then 1 tab by mouth daily for 1 day Patient not taking: Reported on 01/18/2018 02/23/17   Teola Bradley, NP    Family History Family History  Problem Relation Age of Onset  . Diabetes Mother   . Hypertension Mother   . Heart disease Mother     Social History Social History   Tobacco Use  . Smoking status: Former Smoker    Packs/day: 0.50    Types: Cigarettes    Last attempt to quit: 06/11/2017    Years since quitting: 0.9  . Smokeless tobacco: Never Used  Substance Use Topics  . Alcohol use: No  . Drug use: Yes    Frequency: 5.0 times per week    Types: Marijuana     Allergies   Peanut-containing drug products and Peanuts [peanut oil]   Review of Systems Review of Systems  All other systems reviewed and are negative.    Physical Exam Updated Vital Signs BP 101/82 (BP Location: Right Arm)   Pulse (!) 115   Temp (!) 102.1 F (38.9 C) (Oral)   Resp (!) 26   SpO2 100%   Physical Exam Vitals signs and nursing note reviewed.  Constitutional:      Appearance: She is well-developed. She is not diaphoretic.  HENT:     Head: Normocephalic and atraumatic.     Right Ear: Tympanic membrane and external ear normal.     Left Ear: Tympanic membrane and external ear normal.     Nose: Mucosal edema and rhinorrhea present.     Comments: The patient has normal phonation and is in control of secretions. No stridor.  Midline uvula without edema. Soft palate rises symmetrically.  No tonsillar erythema or exudates. No PTA. Tongue protrusion is normal. No trismus. No creptius on neck palpation and patient has good dentition. No gingival erythema or fluctuance noted. Mucus membranes moist.     Mouth/Throat:     Pharynx: Uvula midline.      Tonsils: No tonsillar exudate.  Eyes:     General: No scleral icterus.       Right eye: No discharge.        Left eye: No discharge.     Pupils: Pupils are equal, round, and reactive to light.  Neck:     Musculoskeletal: Neck supple. Normal range of motion. No neck  rigidity or spinous process tenderness.     Trachea: Trachea normal.     Comments: No nuchal rigidity or meningismus Cardiovascular:     Rate and Rhythm: Normal rate and regular rhythm.     Pulses:          Radial pulses are 2+ on the right side and 2+ on the left side.       Dorsalis pedis pulses are 2+ on the right side and 2+ on the left side.       Posterior tibial pulses are 2+ on the right side and 2+ on the left side.     Heart sounds: No murmur.     Comments: No lower extremity swelling or edema. Calves symmetric in size bilaterally. Pulmonary:     Effort: Pulmonary effort is normal. Tachypnea present. No accessory muscle usage or respiratory distress.     Breath sounds: Examination of the right-lower field reveals rales. Wheezing (upper lung fields, end expiratory) and rales present.     Comments: No increased work of breathing. No accessory muscle use. Patient is sitting upright, speaking in full sentences without difficulty  Chest:     Chest wall: No tenderness.  Abdominal:     General: Bowel sounds are normal.     Palpations: Abdomen is soft.     Tenderness: There is no abdominal tenderness. There is no right CVA tenderness, left CVA tenderness, guarding or rebound. Negative signs include Murphy's sign and McBurney's sign.  Lymphadenopathy:     Cervical: No cervical adenopathy.  Skin:    General: Skin is warm and dry.     Findings: No rash.  Neurological:     Mental Status: She is alert.      ED Treatments / Results  Labs (all labs ordered are listed, but only abnormal results are displayed) Labs Reviewed  COMPREHENSIVE METABOLIC PANEL - Abnormal; Notable for the following components:      Result  Value   Potassium 3.3 (*)    CO2 21 (*)    Glucose, Bld 104 (*)    Creatinine, Ser 1.15 (*)    All other components within normal limits  CBC WITH DIFFERENTIAL/PLATELET - Abnormal; Notable for the following components:   RBC 3.81 (*)    Hemoglobin 11.9 (*)    HCT 35.7 (*)    Lymphs Abs 0.5 (*)    All other components within normal limits  INFLUENZA PANEL BY PCR (TYPE A & B) - Abnormal; Notable for the following components:   Influenza B By PCR POSITIVE (*)    All other components within normal limits  I-STAT CG4 LACTIC ACID, ED - Abnormal; Notable for the following components:   Lactic Acid, Venous 2.34 (*)    All other components within normal limits  CULTURE, BLOOD (ROUTINE X 2)  CULTURE, BLOOD (ROUTINE X 2)  URINE CULTURE  LIPASE, BLOOD  URINALYSIS, ROUTINE W REFLEX MICROSCOPIC  I-STAT BETA HCG BLOOD, ED ([facility], [facility], [facility] ONLY)  I-STAT TROPONIN, ED  I-STAT CG4 LACTIC ACID, ED    EKG EKG Interpretation  Date/Time:  Tuesday May 15 2018 12:07:42 EST Ventricular Rate:  106 PR Interval:    QRS Duration: 84 QT Interval:  330 QTC Calculation: 438 R Axis:   70 Text Interpretation:  Sinus tachycardia Minimal voltage criteria for LVH, may be normal variant Nonspecific T wave abnormality No previous tracing Confirmed by Lajean Saver [phone]) on 05/15/2018 1:07:40 PM   Radiology Dg Chest 2 View  Result Date: 05/15/2018 CLINICAL  DATA:  Shortness of breath and headache beginning this morning. EXAM: CHEST - 2 VIEW COMPARISON:  PA and lateral chest 03/18/2018 and 03/08/2018. FINDINGS: The lungs are clear. Heart size is normal. No pneumothorax or pleural fluid. No acute or focal bony abnormality. Mild S-shaped thoracolumbar scoliosis noted and unchanged. IMPRESSION: No acute disease. Electronically Signed   By: Inge Rise M.D.   On: 05/15/2018 13:12    Procedures Procedures (including critical care time)  Medications Ordered in ED Medications  sodium chloride 0.9 % bolus  1,000 mL (1,000 mLs Intravenous New Bag/Given 05/15/18 1331)  sodium chloride 0.9 % bolus 500 mL (500 mLs Intravenous New Bag/Given 05/15/18 1333)  sodium chloride 0.9 % bolus 500 mL (has no administration in time range)  morphine 4 MG/ML injection 4 mg (4 mg Intravenous Given 05/15/18 1323)  acetaminophen (TYLENOL) tablet 1,000 mg (1,000 mg Oral Given 05/15/18 1323)  ipratropium-albuterol (DUONEB) 0.5-2.5 (3) MG/3ML nebulizer solution 3 mL (3 mLs Nebulization Given 05/15/18 1225)     Initial Impression / Assessment and Plan / ED Course  I have reviewed the triage vital signs and the nursing notes.  Pertinent labs & imaging results that were available during my care of the patient were reviewed by me and considered in my medical decision making (see chart for details).     32 year old female with a history of asthma presenting for fever, chest tightness and shortness of breath.  Symptoms onset last night into this morning.  Patient has an albuterol inhaler and was transferred to EMS where she received 10 mg of albuterol 0.5 mg of Atrovent.  On arrival the patient was noted to be febrile, tachycardic and tachypneic.  She is without hypoxia or hypotension.  Patient was without meningeal signs.  No focal deficits on exam.  Lungs with faint expiratory wheezing and basilar crackles at the bases.  Chest x-ray was ordered.  Abdominal exam was benign without any tenderness.  Labs were ordered to rule out sepsis.  Patient was given an additional breathing treatment with resolution of wheezing.  IV fluids, pain and antipyretics ordered.  EKG sinus tach.  Troponin within normal limits.  Lactic elevated at 2.34.  Patient is without lactic >4 or hypotension.  Will hold off on calling code sepsis or ordering 30 cc/kg bolus.  CBC without leukocytosis.  Chest x-ray without evidence of pneumonia or other cardiopulmonary disease.  CMP with mild elevation of creatinine.  Potassium is replaced orally.  Influenza is  positive for influenza B.   Suspect patient's symptoms were likely related to influenza that caused exacerbation of patient's asthma.  Patient only given dose of Decadron in the department and to be treated with Tamiflu and conservative therapies.  Patient without hypoxia in the department.  She feels her breathing is back to normal.  Her vital signs have improved. Lactic acid improved.  The evaluation does not show pathology that would require ongoing emergent intervention or inpatient treatment. I advised the patient to follow-up with PCP this week. I advised the patient to return to the emergency department with new or worsening symptoms or new concerns. Specific return precautions discussed. The patient verbalized understanding and agreement with plan. All questions answered. No further questions at this time. The patient is hemodynamically stable, mentating appropriately and appears safe for discharge.  Patient case discussed with Dr. Ashok Cordia who is in agreement with plan.  Final Clinical Impressions(s) / ED Diagnoses   Final diagnoses:  Influenza B  Wheezing    ED Discharge  Orders         Ordered    benzonatate (TESSALON) 100 MG capsule  Every 8 hours     05/15/18 1508    ibuprofen (ADVIL,MOTRIN) 600 MG tablet  Every 6 hours PRN     05/15/18 1508    oseltamivir (TAMIFLU) 75 MG capsule  Every 12 hours     05/15/18 1508           Lorelle Gibbs 05/15/18 1513    Lajean Saver, MD 05/15/18 [phone]

## 2018-05-15 NOTE — ED Notes (Signed)
Patient transported to X-ray 

## 2018-05-15 NOTE — ED Triage Notes (Signed)
Pt arrives to ED from home with complaints of shortness of breath since this morning. EMS reports pt states she woke up with a headache this morning and became short of breath. Pt states she hs hx of asthma ands is out of her albuterol inhaler. Pt received 10mg  albuterol and 0.5mg  Atrovent in route to hospital. Pt states she has chest tightness but it has improved a little bit since the neb tx. Pt placed in position of comfort with bed locked and lowered, call bell in reach.

## 2018-05-17 ENCOUNTER — Emergency Department (HOSPITAL_COMMUNITY)
Admission: EM | Admit: 2018-05-17 | Discharge: 2018-05-17 | Disposition: A | Discharge status: Home / Self Care | Payer: Self-pay | Attending: Emergency Medicine | Admitting: Emergency Medicine

## 2018-05-17 ENCOUNTER — Other Ambulatory Visit: Payer: Self-pay

## 2018-05-17 ENCOUNTER — Encounter (HOSPITAL_COMMUNITY): Payer: Self-pay

## 2018-05-17 ENCOUNTER — Emergency Department (HOSPITAL_COMMUNITY): Payer: Self-pay

## 2018-05-17 DIAGNOSIS — R0789 Other chest pain: Secondary | ICD-10-CM | POA: Insufficient documentation

## 2018-05-17 DIAGNOSIS — Z87891 Personal history of nicotine dependence: Secondary | ICD-10-CM | POA: Insufficient documentation

## 2018-05-17 DIAGNOSIS — R509 Fever, unspecified: Secondary | ICD-10-CM | POA: Insufficient documentation

## 2018-05-17 DIAGNOSIS — R6889 Other general symptoms and signs: Secondary | ICD-10-CM

## 2018-05-17 DIAGNOSIS — J4541 Moderate persistent asthma with (acute) exacerbation: Secondary | ICD-10-CM | POA: Insufficient documentation

## 2018-05-17 DIAGNOSIS — F1290 Cannabis use, unspecified, uncomplicated: Secondary | ICD-10-CM | POA: Insufficient documentation

## 2018-05-17 LAB — PREGNANCY, URINE: Preg Test, Ur: NEGATIVE

## 2018-05-17 MED ORDER — IPRATROPIUM-ALBUTEROL 0.5-2.5 (3) MG/3ML IN SOLN
3.0000 mL | Freq: Once | RESPIRATORY_TRACT | Status: AC
  Administered 2018-05-17: 3 mL via RESPIRATORY_TRACT
  Filled 2018-05-17: qty 3

## 2018-05-17 MED ORDER — ACETAMINOPHEN 500 MG PO TABS
1000.0000 mg | ORAL_TABLET | Freq: Once | ORAL | Status: AC
  Administered 2018-05-17: 1000 mg via ORAL
  Filled 2018-05-17: qty 2

## 2018-05-17 MED ORDER — ALBUTEROL SULFATE HFA 108 (90 BASE) MCG/ACT IN AERS: 1.0000 | INHALATION_SPRAY | Freq: Four times a day (QID) | RESPIRATORY_TRACT | 0 refills | Status: DC | PRN

## 2018-05-17 MED ORDER — ALBUTEROL SULFATE HFA 108 (90 BASE) MCG/ACT IN AERS
2.0000 | INHALATION_SPRAY | Freq: Once | RESPIRATORY_TRACT | Status: AC
  Administered 2018-05-17: 2 via RESPIRATORY_TRACT
  Filled 2018-05-17: qty 6.7

## 2018-05-17 MED ORDER — KETOROLAC TROMETHAMINE 30 MG/ML IJ SOLN
30.0000 mg | Freq: Once | INTRAMUSCULAR | Status: AC
  Administered 2018-05-17: 30 mg via INTRAMUSCULAR
  Filled 2018-05-17: qty 1

## 2018-05-17 MED ORDER — DEXAMETHASONE 10 MG/ML FOR PEDIATRIC ORAL USE
10.0000 mg | Freq: Once | INTRAMUSCULAR | Status: AC
  Administered 2018-05-17: 10 mg via ORAL
  Filled 2018-05-17 (×2): qty 1

## 2018-05-17 NOTE — ED Triage Notes (Signed)
Pt comes by [facility] for chest pain. Per EMS vitals with in normal limits. Pt was diagnosed with Flu 2 days ago

## 2018-05-17 NOTE — ED Provider Notes (Signed)
Emergency Department Provider Note   I have reviewed the triage vital signs and the nursing notes.   HISTORY  Chief Complaint Chest Pain and Influenza   HPI Ariana Young is a 32 y.o. female with PMH of asthma presents to the emergency department for evaluation of cough, flulike symptoms, and chest pain.  Patient arrived by EMS.  She was seen in the emergency department on 12/24 and diagnosed with flu.  At that time the patient was also experiencing shortness of breath and chest pain.  She had an extensive emergency department work-up and was ultimately discharged home with symptom management and a prescription for Tamiflu.  She states that the prescription was sent to her pharmacy which is closed and so she has been unable to get it filled.  She notes that all she is had symptoms for approximately 1 week.  Her cough is mildly productive without hemoptysis.  No vomiting or diarrhea.  She had some mild chest pain last night with coughing and this morning awoke with shortness of breath.  She has tightness in her chest which she states feels similar to her prior asthma exacerbations.  No radiation of symptoms or other modifying factors.   Past Medical History:  Diagnosis Date  . Asthma    uses inhaler as needed  . 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   Past Surgical History:  Procedure Laterality Date  . OVARIAN CYST SURGERY  2008   ELAP, removal of bilateral dermoid cysts  . WISDOM TOOTH EXTRACTION      Allergies Peanut-containing drug products and Peanuts [peanut oil]  Family History  Problem Relation Age of Onset  . Diabetes Mother   . Hypertension Mother   . Heart disease Mother     Social History Social History   Tobacco Use  . Smoking status: Former Smoker    Packs/day: 0.50    Types: Cigarettes    Last attempt to quit: 06/11/2017    Years since quitting: 0.9  . Smokeless tobacco: Never Used    Substance Use Topics  . Alcohol use: No  . Drug use: Yes    Frequency: 5.0 times per week    Types: Marijuana    Review of Systems  Constitutional: Positive fever/chills Eyes: No visual changes. ENT: No sore throat. Cardiovascular: Positive chest pain. Respiratory: Positive shortness of breath and cough.  Gastrointestinal: No abdominal pain.  No nausea, no vomiting.  No diarrhea.  No constipation. Genitourinary: Negative for dysuria. Musculoskeletal: Negative for back pain. Skin: Negative for rash. Neurological: Negative for headaches, focal weakness or numbness.  10-point ROS otherwise negative.  ____________________________________________   PHYSICAL EXAM:  VITAL SIGNS: ED Triage Vitals  Enc Vitals Group     BP 05/17/18 0721 (!) 142/88     Pulse Rate 05/17/18 0721 (!) 105     Resp 05/17/18 0721 (!) 26     Temp 05/17/18 0724 (!) 103 F (39.4 C)     Temp Source 05/17/18 0724 Oral     SpO2 05/17/18 0721 98 %     Pain Score 05/17/18 0721 10   Constitutional: Alert and oriented. Well appearing and in no acute distress. Eyes: Conjunctivae are normal. Head: Atraumatic. Nose: Positive congestion/rhinnorhea. Mouth/Throat: Mucous membranes are moist.   Neck: No stridor.   Cardiovascular: Tachycardia. Good peripheral circulation. Grossly normal heart sounds.   Respiratory: Increased respiratory effort.  No retractions. Lungs with faint  end-expiratory wheezing and bronchospastic cough.  Gastrointestinal: No distention.  Musculoskeletal: No gross deformities of extremities. Neurologic:  Normal speech and language.  Skin:  Skin is warm, dry and intact.  ____________________________________________   LABS (all labs ordered are listed, but only abnormal results are displayed)  Labs Reviewed  PREGNANCY, URINE   ____________________________________________  RADIOLOGY  Dg Chest 2 View  Result Date: 05/17/2018 CLINICAL DATA:  Cough and shortness of breath. EXAM:  CHEST - 2 VIEW COMPARISON:  Chest x-ray from yesterday. FINDINGS: The patient's hair overlies the lung apices. The heart size and mediastinal contours are within normal limits. Normal pulmonary vascularity. No focal consolidation, pleural effusion, or pneumothorax. No acute osseous abnormality. IMPRESSION: No active cardiopulmonary disease. Electronically Signed   By: Titus Dubin M.D.   On: 05/17/2018 08:09    ____________________________________________   PROCEDURES  Procedure(s) performed:   Procedures  None ____________________________________________   INITIAL IMPRESSION / ASSESSMENT AND PLAN / ED COURSE  Pertinent labs & imaging results that were available during my care of the patient were reviewed by me and considered in my medical decision making (see chart for details).  Patient presents to the emergency department with flulike symptoms for the past week.  She was diagnosed with the flu on 12/24.  Patient is outside of the window to benefit from Tamiflu.  Her presentation today seems most consistent with asthma flare from underlying viral illness.  Patient is experiencing chest pain which feels similar to prior chest pain episodes during asthma exacerbation.  On 12/24 the patient had an extensive work-up for the symptoms including EKG, sepsis evaluation, and troponin.  My suspicion for atypical acute coronary syndrome or PE is extremely low in the setting of flulike symptoms.  Plan for symptom management here including nebulizer, Tylenol for fever, and Toradol for body aches pending pregnancy testing.  Patient is not hypoxemic.  Tachycardia likely explained by fever.  Will follow vitals and reassess frequently.  Patient improved after two nebs. CXR without infiltrate. Plan for discharge with close PCP follow up.   At this time, I do not feel there is any life-threatening condition present. I have reviewed and discussed all results (EKG, imaging, lab, urine as appropriate), exam  findings with patient. I have reviewed nursing notes and appropriate previous records.  I feel the patient is safe to be discharged home without further emergent workup. Discussed usual and customary return precautions. Patient and family (if present) verbalize understanding and are comfortable with this plan.  Patient will follow-up with their primary care provider. If they do not have a primary care provider, information for follow-up has been provided to them. All questions have been answered.  ____________________________________________  FINAL CLINICAL IMPRESSION(S) / ED DIAGNOSES  Final diagnoses:  Moderate persistent asthma with exacerbation  Flu-like symptoms     MEDICATIONS GIVEN DURING THIS VISIT:  Medications  ipratropium-albuterol (DUONEB) 0.5-2.5 (3) MG/3ML nebulizer solution 3 mL (3 mLs Nebulization Given 05/17/18 0739)  acetaminophen (TYLENOL) tablet 1,000 mg (1,000 mg Oral Given 05/17/18 0739)  ipratropium-albuterol (DUONEB) 0.5-2.5 (3) MG/3ML nebulizer solution 3 mL (3 mLs Nebulization Given 05/17/18 0907)  ketorolac (TORADOL) 30 MG/ML injection 30 mg (30 mg Intramuscular Given 05/17/18 1045)  dexamethasone (DECADRON) 10 MG/ML injection for Pediatric ORAL use 10 mg (10 mg Oral Given 05/17/18 1146)  albuterol (PROVENTIL HFA;VENTOLIN HFA) 108 (90 Base) MCG/ACT inhaler 2 puff (2 puffs Inhalation Given 05/17/18 1120)    Note:  This document was prepared using Dragon voice recognition software and may  include unintentional dictation errors.  Nanda Quinton, MD Emergency Medicine    Correna Meacham, [facility], MD 05/17/18 2038

## 2018-05-17 NOTE — Discharge Instructions (Signed)
We believe that your symptoms are caused today by an exacerbation of your asthma.  Please take the prescribed medications and any medications that you have at home.  Follow up with your doctor as recommended.  If you develop any new or worsening symptoms, including but not limited to fever, persistent vomiting, worsening shortness of breath, or other symptoms that concern you, please return to the Emergency Department immediately. ° ° °Asthma °Asthma is a recurring condition in which the airways tighten and narrow. Asthma can make it difficult to breathe. It can cause coughing, wheezing, and shortness of breath. Asthma episodes, also called asthma attacks, range from minor to life-threatening. Asthma cannot be cured, but medicines and lifestyle changes can help control it. °CAUSES °Asthma is believed to be caused by inherited (genetic) and environmental factors, but its exact cause is unknown. Asthma may be triggered by allergens, lung infections, or irritants in the air. Asthma triggers are different for each person. Common triggers include:  °Animal dander. °Dust mites. °Cockroaches. °Pollen from trees or grass. °Mold. °Smoke. °Air pollutants such as dust, household cleaners, hair sprays, aerosol sprays, paint fumes, strong chemicals, or strong odors. °Cold air, weather changes, and winds (which increase molds and pollens in the air). °Strong emotional expressions such as crying or laughing hard. °Stress. °Certain medicines (such as aspirin) or types of drugs (such as beta-blockers). °Sulfites in foods and drinks. Foods and drinks that may contain sulfites include dried fruit, potato chips, and sparkling grape juice. °Infections or inflammatory conditions such as the flu, a cold, or an inflammation of the nasal membranes (rhinitis). °Gastroesophageal reflux disease (GERD). °Exercise or strenuous activity. °SYMPTOMS °Symptoms may occur immediately after asthma is triggered or many hours later. Symptoms  include: °Wheezing. °Excessive nighttime or early morning coughing. °Frequent or severe coughing with a common cold. °Chest tightness. °Shortness of breath. °DIAGNOSIS  °The diagnosis of asthma is made by a review of your medical history and a physical exam. Tests may also be performed. These may include: °Lung function studies. These tests show how much air you breathe in and out. °Allergy tests. °Imaging tests such as X-rays. °TREATMENT  °Asthma cannot be cured, but it can usually be controlled. Treatment involves identifying and avoiding your asthma triggers. It also involves medicines. There are 2 classes of medicine used for asthma treatment:  °Controller medicines. These prevent asthma symptoms from occurring. They are usually taken every day. °Reliever or rescue medicines. These quickly relieve asthma symptoms. They are used as needed and provide short-term relief. °Your health care provider will help you create an asthma action plan. An asthma action plan is a written plan for managing and treating your asthma attacks. It includes a list of your asthma triggers and how they may be avoided. It also includes information on when medicines should be taken and when their dosage should be changed. An action plan may also involve the use of a device called a peak flow meter. A peak flow meter measures how well the lungs are working. It helps you monitor your condition. °HOME CARE INSTRUCTIONS  °Take medicines only as directed by your health care provider. Speak with your health care provider if you have questions about how or when to take the medicines. °Use a peak flow meter as directed by your health care provider. Record and keep track of readings. °Understand and use the action plan to help minimize or stop an asthma attack without needing to seek medical care. °Control your home environment in the following   ways to help prevent asthma attacks: °Do not smoke. Avoid being exposed to secondhand smoke. °Change  your heating and air conditioning filter regularly. °Limit your use of fireplaces and wood stoves. °Get rid of pests (such as roaches and mice) and their droppings. °Throw away plants if you see mold on them. °Clean your floors and dust regularly. Use unscented cleaning products. °Try to have someone else vacuum for you regularly. Stay out of rooms while they are being vacuumed and for a short while afterward. If you vacuum, use a dust mask from a hardware store, a double-layered or microfilter vacuum cleaner bag, or a vacuum cleaner with a HEPA filter. °Replace carpet with wood, tile, or vinyl flooring. Carpet can trap dander and dust. °Use allergy-proof pillows, mattress covers, and box spring covers. °Wash bed sheets and blankets every week in hot water and dry them in a dryer. °Use blankets that are made of polyester or cotton. °Clean bathrooms and kitchens with bleach. If possible, have someone repaint the walls in these rooms with mold-resistant paint. Keep out of the rooms that are being cleaned and painted. °Wash hands frequently. °SEEK MEDICAL CARE IF:  °You have wheezing, shortness of breath, or a cough even if taking medicine to prevent attacks. °The colored mucus you cough up (sputum) is thicker than usual. °Your sputum changes from clear or white to yellow, green, gray, or bloody. °You have any problems that may be related to the medicines you are taking (such as a rash, itching, swelling, or trouble breathing). °You are using a reliever medicine more than 2-3 times per week. °Your peak flow is still at 50-79% of your personal best after following your action plan for 1 hour. °You have a fever. °SEEK IMMEDIATE MEDICAL CARE IF:  °You seem to be getting worse and are unresponsive to treatment during an asthma attack. °You are short of breath even at rest. °You get short of breath when doing very little physical activity. °You have difficulty eating, drinking, or talking due to asthma symptoms. °You  develop chest pain. °You develop a fast heartbeat. °You have a bluish color to your lips or fingernails. °You are light-headed, dizzy, or faint. °Your peak flow is less than 50% of your personal best. °MAKE SURE YOU:  °Understand these instructions. °Will watch your condition. °Will get help right away if you are not doing well or get worse. °Document Released: 05/09/2005 Document Revised: 09/23/2013 Document Reviewed: 12/06/2012 °[employer]® Patient Information ©2015 [employer]. This information is not intended to replace advice given to you by your health care provider. Make sure you discuss any questions you have with your health care provider. ° °How to Use a Nebulizer °If you have asthma or other breathing problems, you might need to breathe in (inhale) medicine. This can be done with a nebulizer. A nebulizer is a device that turns liquid medicine into a mist that you can inhale.  °There are different kinds of nebulizers. Most are small. With some, you breathe in through a mouthpiece. With others, a mask fits over your nose and mouth. Most nebulizers must be connected to a small air compressor. Air is forced through tubing from the compressor to the nebulizer. The forced air changes the liquid into a fine spray. °RISKS AND COMPLICATIONS °The nebulizer must work properly for it to help your breathing. If the nebulizer does not produce mist, or if foam comes out, this indicates that the nebulizer is not working properly. Sometimes a filter can get clogged, or   there might be a problem with the air compressor. Check the instruction booklet that came with your nebulizer. It should tell you how to fix problems or where to call for help. You should have at least one extra nebulizer at home. That way, you will always have one when you need it.  °HOW TO PREPARE BEFORE USING THE NEBULIZER °Take these steps before using the nebulizer: °Check your medicine. Make sure it has not expired and is not damaged in any way.    °Wash your hands with soap and water.   °Put all the parts of your nebulizer on a sturdy, flat surface. Make sure the tubing connects the compressor and the nebulizer. °Measure the liquid medicine according to your health care provider's instructions. Pour it into the nebulizer. °Attach the mouthpiece or mask.   °Test the nebulizer by turning it on to make sure a spray is coming out. Then, turn it off.   °HOW TO USE THE NEBULIZER °Sit down and focus on staying relaxed.   °If your nebulizer has a mask, put it over your nose and mouth. If you use a mouthpiece, put it in your mouth. Press your lips firmly around the mouthpiece. °Turn on the nebulizer.   °Breathe out.   °Some nebulizers have a finger valve. If yours does, cover up the air hole so the air gets to the nebulizer. °Once the medicine begins to mist out, take slow, deep breaths. If there is a finger valve, release it at the end of your breath. °Continue taking slow, deep breaths until the nebulizer is empty.   °Be sure to stop the machine at any point if you start coughing or if the medicine foams or bubbles. °HOW TO CLEAN THE NEBULIZER  °The nebulizer and all its parts must be kept very clean. Follow the manufacturer's instructions for cleaning. For most nebulizers, you should follow these guidelines: °Wash the nebulizer after each use. Use warm water and soap. Rinse it well. Shake the nebulizer to remove extra water. Put it on a clean towel until it is completely dry. To make sure it is dry, put the nebulizer back together. Turn on the compressor for a few minutes. This will blow air through the nebulizer.   °Do not wash the tubing or the finger valve.   °Store the nebulizer in a dust-free place.   °Inspect the filter every week. Replace it any time it looks dirty.   °Sometimes the nebulizer will need a more complete cleaning. The instruction booklet should say how often you need to do this. °SEEK MEDICAL CARE IF:  °You continue to have difficulty  breathing.   °You have trouble using the nebulizer.   °Document Released: 04/27/2009 Document Revised: 09/23/2013 Document Reviewed: 10/29/2012 °[employer]® Patient Information ©2015 [employer]. This information is not intended to replace advice given to you by your health care provider. Make sure you discuss any questions you have with your health care provider. ° °How to Use an Inhaler °Proper inhaler technique is very important. Good technique ensures that the medicine reaches the lungs. Poor technique results in depositing the medicine on the tongue and back of the throat rather than in the airways. If you do not use the inhaler with good technique, the medicine will not help you. °STEPS TO FOLLOW IF USING AN INHALER WITHOUT AN EXTENSION TUBE °Remove the cap from the inhaler. °If you are using the inhaler for the first time, you will need to prime it. Shake the inhaler for   5 seconds and release four puffs into the air, away from your face. Ask your health care provider or pharmacist if you have questions about priming your inhaler. °Shake the inhaler for 5 seconds before each breath in (inhalation). °Position the inhaler so that the top of the canister faces up. °Put your index finger on the top of the medicine canister. Your thumb supports the bottom of the inhaler. °Open your mouth. °Either place the inhaler between your teeth and place your lips tightly around the mouthpiece, or hold the inhaler 1-2 inches away from your open mouth. If you are unsure of which technique to use, ask your health care provider. °Breathe out (exhale) normally and as completely as possible. °Press the canister down with your index finger to release the medicine. °At the same time as the canister is pressed, inhale deeply and slowly until your lungs are completely filled. This should take 4-6 seconds. Keep your tongue down. °Hold the medicine in your lungs for 5-10 seconds (10 seconds is best). This helps the medicine get into the  small airways of your lungs. °Breathe out slowly, through pursed lips. Whistling is an example of pursed lips. °Wait at least 15-30 seconds between puffs. Continue with the above steps until you have taken the number of puffs your health care provider has ordered. Do not use the inhaler more than your health care provider tells you. °Replace the cap on the inhaler. °Follow the directions from your health care provider or the inhaler insert for cleaning the inhaler. °STEPS TO FOLLOW IF USING AN INHALER WITH AN EXTENSION (SPACER) °Remove the cap from the inhaler. °If you are using the inhaler for the first time, you will need to prime it. Shake the inhaler for 5 seconds and release four puffs into the air, away from your face. Ask your health care provider or pharmacist if you have questions about priming your inhaler. °Shake the inhaler for 5 seconds before each breath in (inhalation). °Place the open end of the spacer onto the mouthpiece of the inhaler. °Position the inhaler so that the top of the canister faces up and the spacer mouthpiece faces you. °Put your index finger on the top of the medicine canister. Your thumb supports the bottom of the inhaler and the spacer. °Breathe out (exhale) normally and as completely as possible. °Immediately after exhaling, place the spacer between your teeth and into your mouth. Close your lips tightly around the spacer. °Press the canister down with your index finger to release the medicine. °At the same time as the canister is pressed, inhale deeply and slowly until your lungs are completely filled. This should take 4-6 seconds. Keep your tongue down and out of the way. °Hold the medicine in your lungs for 5-10 seconds (10 seconds is best). This helps the medicine get into the small airways of your lungs. Exhale. °Repeat inhaling deeply through the spacer mouthpiece. Again hold that breath for up to 10 seconds (10 seconds is best). Exhale slowly. If it is difficult to take  this second deep breath through the spacer, breathe normally several times through the spacer. Remove the spacer from your mouth. °Wait at least 15-30 seconds between puffs. Continue with the above steps until you have taken the number of puffs your health care provider has ordered. Do not use the inhaler more than your health care provider tells you. °Remove the spacer from the inhaler, and place the cap on the inhaler. °Follow the directions from your health care provider   or the inhaler insert for cleaning the inhaler and spacer. °If you are using different kinds of inhalers, use your quick relief medicine to open the airways 10-15 minutes before using a steroid if instructed to do so by your health care provider. If you are unsure which inhalers to use and the order of using them, ask your health care provider, nurse, or respiratory therapist. °If you are using a steroid inhaler, always rinse your mouth with water after your last puff, then gargle and spit out the water. Do not swallow the water. °AVOID: °Inhaling before or after starting the spray of medicine. It takes practice to coordinate your breathing with triggering the spray. °Inhaling through the nose (rather than the mouth) when triggering the spray. °HOW TO DETERMINE IF YOUR INHALER IS FULL OR NEARLY EMPTY °You cannot know when an inhaler is empty by shaking it. A few inhalers are now being made with dose counters. Ask your health care provider for a prescription that has a dose counter if you feel you need that extra help. If your inhaler does not have a counter, ask your health care provider to help you determine the date you need to refill your inhaler. Write the refill date on a calendar or your inhaler canister. Refill your inhaler 7-10 days before it runs out. Be sure to keep an adequate supply of medicine. This includes making sure it is not expired, and that you have a spare inhaler.  °SEEK MEDICAL CARE IF:  °Your symptoms are only partially  relieved with your inhaler. °You are having trouble using your inhaler. °You have some increase in phlegm. °SEEK IMMEDIATE MEDICAL CARE IF:  °You feel little or no relief with your inhalers. You are still wheezing and are feeling shortness of breath or tightness in your chest or both. °You have dizziness, headaches, or a fast heart rate. °You have chills, fever, or night sweats. °You have a noticeable increase in phlegm production, or there is blood in the phlegm. °MAKE SURE YOU:  °Understand these instructions. °Will watch your condition. °Will get help right away if you are not doing well or get worse. °Document Released: 05/06/2000 Document Revised: 02/27/2013 Document Reviewed: 12/06/2012 °[employer]® Patient Information ©2015 [employer]. This information is not intended to replace advice given to you by your health care provider. Make sure you discuss any questions you have with your health care provider. ° ° ° °

## 2018-05-17 NOTE — ED Notes (Signed)
Patient transported to X-ray 

## 2018-05-18 LAB — URINE CULTURE

## 2018-05-20 LAB — CULTURE, BLOOD (ROUTINE X 2)
CULTURE: NO GROWTH
Culture: NO GROWTH
Special Requests: ADEQUATE

## 2018-08-06 ENCOUNTER — Telehealth: Payer: Self-pay | Admitting: Family Medicine

## 2018-08-06 NOTE — Telephone Encounter (Signed)
Called the patient regarding the message left by the call center on 08/02/2018. The patient stated she would like to make an appointment regarding a surgery she had in 2007. She is experiencing pain on her left side and needs to get a pap smear. I left a voicemail requesting the patient call our office.

## 2018-11-15 ENCOUNTER — Other Ambulatory Visit: Payer: Self-pay | Admitting: Family Medicine

## 2019-03-13 ENCOUNTER — Emergency Department (HOSPITAL_COMMUNITY)
Admission: EM | Admit: 2019-03-13 | Discharge: 2019-03-13 | Disposition: A | Discharge status: Home / Self Care | Payer: Medicaid Other | Attending: Emergency Medicine | Admitting: Emergency Medicine

## 2019-03-13 ENCOUNTER — Other Ambulatory Visit: Payer: Self-pay

## 2019-03-13 DIAGNOSIS — L0291 Cutaneous abscess, unspecified: Secondary | ICD-10-CM

## 2019-03-13 DIAGNOSIS — Z91010 Allergy to peanuts: Secondary | ICD-10-CM | POA: Insufficient documentation

## 2019-03-13 DIAGNOSIS — J45909 Unspecified asthma, uncomplicated: Secondary | ICD-10-CM | POA: Insufficient documentation

## 2019-03-13 DIAGNOSIS — L0201 Cutaneous abscess of face: Secondary | ICD-10-CM | POA: Insufficient documentation

## 2019-03-13 DIAGNOSIS — Z87891 Personal history of nicotine dependence: Secondary | ICD-10-CM | POA: Insufficient documentation

## 2019-03-13 MED ORDER — LIDOCAINE HCL 2 % IJ SOLN
5.0000 mL | Freq: Once | INTRAMUSCULAR | Status: AC
  Administered 2019-03-13: 100 mg
  Filled 2019-03-13: qty 20

## 2019-03-13 MED ORDER — HYDROCODONE-ACETAMINOPHEN 5-325 MG PO TABS: 1.0000 | ORAL_TABLET | Freq: Four times a day (QID) | ORAL | 0 refills | Status: AC | PRN

## 2019-03-13 MED ORDER — ACETAMINOPHEN 500 MG PO TABS
1000.0000 mg | ORAL_TABLET | Freq: Once | ORAL | Status: AC
  Administered 2019-03-13: 1000 mg via ORAL
  Filled 2019-03-13: qty 2

## 2019-03-13 MED ORDER — MUPIROCIN CALCIUM 2 % EX CREA: 1.0000 "application " | TOPICAL_CREAM | Freq: Two times a day (BID) | CUTANEOUS | 0 refills | Status: DC

## 2019-03-13 NOTE — ED Triage Notes (Addendum)
PT has nondraining abscess to right cheek. Noticed it last Saturday. Swelling getting worse. Denies fever and chills. No treatment thus far for it

## 2019-03-13 NOTE — ED Notes (Signed)
Pt in tears, given tylenol for pain. Wound dressed with gauze and tape.

## 2019-03-13 NOTE — Discharge Instructions (Addendum)
Wash the wound daily with soap and water and apply the prescription ointment 1-3 times a day.  Apply warm water soaks to help to continue to drain the abscess.

## 2019-03-13 NOTE — ED Provider Notes (Signed)
[facility] EMERGENCY DEPARTMENT Provider Note   CSN: [id number] Arrival date & time: 03/13/19  1346     History   Chief Complaint Chief Complaint  Patient presents with  . Recurrent Skin Infections    HPI Ariana Young is a 33 y.o. female.     Patient is a 33 year old female with no significant past medical history presenting to the emergency department for abscess on the right cheek.  Patient reports that she noticed this about a week ago.  It started off as a pimple and then she tried to pop it and it got worse.  Denies any fever, chills, chest pain, shortness of breath, trouble breathing or swallowing.     Past Medical History:  Diagnosis Date  . Asthma    uses inhaler as needed  . 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   Past Surgical History:  Procedure Laterality Date  . OVARIAN CYST SURGERY  2008   ELAP, removal of bilateral dermoid cysts  . WISDOM TOOTH EXTRACTION       OB History    Gravida  1   Para  1   Term  1   Preterm  0   AB  0   Living  1     SAB  0   TAB  0   Ectopic  0   Multiple  0   Live Births  1            Home Medications    Prior to Admission medications   Medication Sig Start Date End Date Taking? Authorizing Provider  benzonatate (TESSALON) 100 MG capsule Take 1 capsule (100 mg total) by mouth every 8 (eight) hours. 05/15/18   Maczis, Barth Kirks, PA-C  EPINEPHrine (EPIPEN) 0.3 mg/0.3 mL DEVI Inject 0.3 mg into the muscle once as needed (for allergic reaction).     [provider]  famotidine (PEPCID) 20 MG tablet Take 1 tablet (20 mg total) by mouth 2 (two) times daily. Patient not taking: Reported on 05/17/2018 03/18/18   Delia Heady, PA-C  fluticasone (FLONASE) 50 MCG/ACT nasal spray Place 1 spray into both nostrils daily as needed for allergies or rhinitis.    [provider]  hydrocortisone 1 % ointment Apply  1 application topically daily. 05/20/15   Truett Mainland, DO  ibuprofen (ADVIL,MOTRIN) 200 MG tablet Take 400 mg by mouth as needed for moderate pain.    [provider]  ibuprofen (ADVIL,MOTRIN) 600 MG tablet Take 1 tablet (600 mg total) by mouth every 6 (six) hours as needed. 05/15/18   Maczis, Barth Kirks, PA-C  mupirocin cream (BACTROBAN) 2 % Apply 1 application topically 2 (two) times daily. 03/13/19   Alveria Apley, PA-C  naproxen sodium (ANAPROX) 550 MG tablet Take 1 tablet (550 mg total) by mouth 2 (two) times daily with a meal. As needed for pain Patient not taking: Reported on 01/18/2018 04/22/16   Anyanwu, Sallyanne Havers, MD  ondansetron (ZOFRAN ODT) 4 MG disintegrating tablet Take 1 tablet (4 mg total) by mouth every 8 (eight) hours as needed for nausea or vomiting. Patient not taking: Reported on 05/17/2018 03/18/18   Delia Heady, PA-C  oseltamivir (TAMIFLU) 75 MG capsule Take 1 capsule (75 mg total) by mouth every 12 (twelve) hours. 05/15/18   Maczis, Barth Kirks, PA-C  predniSONE (STERAPRED UNI-PAK 21 TAB) 10 MG (21) TBPK tablet Take by mouth  daily. Take 6 tabs by mouth daily  for 1 day, then 5 tabs for 1 day, then 4 tabs for 1 day, then 3 tabs for 1 day, 2 tabs for 1 day, then 1 tab by mouth daily for 1 day Patient not taking: Reported on 01/18/2018 02/23/17   Multani, Bhupinder, NP  PROVENTIL HFA 108 (90 Base) MCG/ACT inhaler INHALE 2 PUFFS INTO THE LUNGS EVERY FOUR HOURS AS NEEDED FOR WHEEZING OR SHORTNESS OF BREATH 11/15/18   Truett Mainland, DO    Family History Family History  Problem Relation Age of Onset  . Diabetes Mother   . Hypertension Mother   . Heart disease Mother     Social History Social History   Tobacco Use  . Smoking status: Former Smoker    Packs/day: 0.50    Types: Cigarettes    Quit date: 06/11/2017    Years since quitting: 1.7  . Smokeless tobacco: Never Used  Substance Use Topics  . Alcohol use: No  . Drug use: Yes    Frequency: 5.0 times per  week    Types: Marijuana     Allergies   Peanut-containing drug products and Peanuts [peanut oil]   Review of Systems Review of Systems  Constitutional: Negative for fever.  Skin: Positive for wound.  Allergic/Immunologic: Negative for immunocompromised state.  Hematological: Does not bruise/bleed easily.     Physical Exam Updated Vital Signs BP (!) 140/108 (BP Location: Right Arm)   Pulse 70   Temp 98.6 F (37 C) (Oral)   Resp 16   LMP 02/21/2019   SpO2 100%   Physical Exam Vitals signs and nursing note reviewed.  Constitutional:      Appearance: Normal appearance.  HENT:     Head: Normocephalic.      Mouth/Throat:     Mouth: Mucous membranes are moist.     Tongue: No lesions.     Pharynx: Oropharynx is clear.     Tonsils: No tonsillar exudate or tonsillar abscesses.  Eyes:     Conjunctiva/sclera: Conjunctivae normal.  Pulmonary:     Effort: Pulmonary effort is normal.  Lymphadenopathy:     Cervical: No cervical adenopathy.  Skin:    General: Skin is dry.  Neurological:     Mental Status: She is alert.  Psychiatric:        Mood and Affect: Mood normal.      ED Treatments / Results  Labs (all labs ordered are listed, but only abnormal results are displayed) Labs Reviewed - No data to display  EKG None  Radiology No results found.  Procedures .Marland KitchenIncision and Drainage  Date/Time: 03/13/2019 3:53 PM Performed by: Alveria Apley, PA-C Authorized by: Alveria Apley, PA-C   Consent:    Consent obtained:  Verbal   Consent given by:  Patient   Risks discussed:  Bleeding, incomplete drainage, pain and damage to other organs   Alternatives discussed:  No treatment Universal protocol:    Procedure explained and questions answered to patient or proxy's satisfaction: yes     Relevant documents present and verified: yes     Test results available and properly labeled: yes     Imaging studies available: yes     Required blood products, implants,  devices, and special equipment available: yes     Site/side marked: yes     Immediately prior to procedure a time out was called: yes     Patient identity confirmed:  Verbally with patient Location:  Type:  Abscess   Size:  1 cm   Location:  Head   Head location:  Face (Right cheek) Pre-procedure details:    Skin preparation:  Betadine Anesthesia (see MAR for exact dosages):    Anesthesia method:  Local infiltration   Local anesthetic:  Lidocaine 2% w/o epi Procedure type:    Complexity:  Simple Procedure details:    Incision types:  Single straight (50mm incision)   Incision depth:  Subcutaneous   Scalpel blade:  11   Wound management:  Probed and deloculated, irrigated with saline and extensive cleaning   Drainage:  Purulent   Drainage amount:  Scant   Packing materials:  None Post-procedure details:    Patient tolerance of procedure:  Tolerated well, no immediate complications   (including critical care time)  Medications Ordered in ED Medications  lidocaine (XYLOCAINE) 2 % (with pres) injection 100 mg (100 mg Infiltration Given 03/13/19 1552)     Initial Impression / Assessment and Plan / ED Course  I have reviewed the triage vital signs and the nursing notes.  Pertinent labs & imaging results that were available during my care of the patient were reviewed by me and considered in my medical decision making (see chart for details).        Based on review of vitals, medical screening exam, lab work and/or imaging, there does not appear to be an acute, emergent etiology for the patient's symptoms. Counseled pt on good return precautions and encouraged both PCP and ED follow-up as needed.  Prior to discharge, I also discussed incidental imaging findings with patient in detail and advised appropriate, recommended follow-up in detail.  Clinical Impression: 1. Abscess     Disposition: Discharge  Prior to providing a prescription for a controlled substance, I  independently reviewed the patient's recent prescription history on the [place]. The patient had no recent or regular prescriptions and was deemed appropriate for a brief, less than 3 day prescription of narcotic for acute analgesia.  This note was prepared with assistance of Systems analyst. Occasional wrong-word or sound-a-like substitutions may have occurred due to the inherent limitations of voice recognition software.   Final Clinical Impressions(s) / ED Diagnoses   Final diagnoses:  Abscess    ED Discharge Orders         Ordered    mupirocin cream (BACTROBAN) 2 %  2 times daily     03/13/19 1611           Kristine Royal 03/13/19 Canon, [place], DO 03/18/19 1614

## 2019-03-19 ENCOUNTER — Telehealth: Payer: Self-pay | Admitting: *Deleted

## 2019-03-19 NOTE — Telephone Encounter (Signed)
Pharmacy called related to Rx: Mupirocin cream being too expensive and suggesting to change to ointment...EDCM clarified with EDP (Hedges) to change Rx to: ointment.

## 2019-03-25 ENCOUNTER — Encounter (HOSPITAL_COMMUNITY): Payer: Self-pay | Admitting: *Deleted

## 2019-03-25 ENCOUNTER — Emergency Department (HOSPITAL_COMMUNITY)
Admission: EM | Admit: 2019-03-25 | Discharge: 2019-03-25 | Disposition: A | Discharge status: Home / Self Care | Payer: Medicaid Other | Attending: Emergency Medicine | Admitting: Emergency Medicine

## 2019-03-25 ENCOUNTER — Other Ambulatory Visit: Payer: Self-pay

## 2019-03-25 DIAGNOSIS — R21 Rash and other nonspecific skin eruption: Secondary | ICD-10-CM | POA: Insufficient documentation

## 2019-03-25 DIAGNOSIS — J45909 Unspecified asthma, uncomplicated: Secondary | ICD-10-CM | POA: Insufficient documentation

## 2019-03-25 DIAGNOSIS — Z91010 Allergy to peanuts: Secondary | ICD-10-CM | POA: Insufficient documentation

## 2019-03-25 DIAGNOSIS — Z87891 Personal history of nicotine dependence: Secondary | ICD-10-CM | POA: Insufficient documentation

## 2019-03-25 DIAGNOSIS — L0100 Impetigo, unspecified: Secondary | ICD-10-CM

## 2019-03-25 DIAGNOSIS — F1210 Cannabis abuse, uncomplicated: Secondary | ICD-10-CM | POA: Insufficient documentation

## 2019-03-25 HISTORY — DX: Dermatitis, unspecified: L30.9

## 2019-03-25 MED ORDER — ERYTHROMYCIN 5 MG/GM OP OINT: TOPICAL_OINTMENT | OPHTHALMIC | 0 refills | Status: DC

## 2019-03-25 MED ORDER — METHYLPREDNISOLONE SODIUM SUCC 125 MG IJ SOLR
125.0000 mg | Freq: Once | INTRAMUSCULAR | Status: AC
  Administered 2019-03-25: 125 mg via INTRAMUSCULAR
  Filled 2019-03-25: qty 2

## 2019-03-25 MED ORDER — DOXYCYCLINE HYCLATE 100 MG PO CAPS: 100.0000 mg | ORAL_CAPSULE | Freq: Two times a day (BID) | ORAL | 0 refills | Status: DC

## 2019-03-25 NOTE — Discharge Instructions (Addendum)
Call the eye doctor tomorrow make an appointment this week.  Stop using the antibiotic cream.  And stop using the hydrocortisone cream

## 2019-03-25 NOTE — ED Provider Notes (Signed)
[facility] DEPT Provider Note   CSN: [id number] Arrival date & time: 03/25/19  1732     History   Chief Complaint Chief Complaint  Patient presents with  . Eye Pain  . Eczema    HPI Ariana Young is a 33 y.o. female.     Patient has eczema.  And was put on Bactroban for skin infection.  The eczema is gotten worse and now she has swelling to left upper eyelid.  The history is provided by the patient. No language interpreter was used.  Rash Location:  Face Facial rash location:  Face Quality: blistering   Severity:  Moderate Onset quality:  Sudden Timing:  Constant Chronicity:  New Associated symptoms: no abdominal pain, no diarrhea, no fatigue and no headaches     Past Medical History:  Diagnosis Date  . Asthma    uses inhaler as needed  . 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 Eczema     There are no active problems to display for this patient.   Past Surgical History:  Procedure Laterality Date  . OVARIAN CYST SURGERY  2008   ELAP, removal of bilateral dermoid cysts  . WISDOM TOOTH EXTRACTION       OB History    Gravida  1   Para  1   Term  1   Preterm  0   AB  0   Living  1     SAB  0   TAB  0   Ectopic  0   Multiple  0   Live Births  1            Home Medications    Prior to Admission medications   Medication Sig Start Date End Date Taking? Authorizing Provider  benzonatate (TESSALON) 100 MG capsule Take 1 capsule (100 mg total) by mouth every 8 (eight) hours. 05/15/18   Maczis, Barth Kirks, PA-C  doxycycline (VIBRAMYCIN) 100 MG capsule Take 1 capsule (100 mg total) by mouth 2 (two) times daily. One po bid x 7 days 03/25/19   Milton Ferguson, MD  EPINEPHrine (EPIPEN) 0.3 mg/0.3 mL DEVI Inject 0.3 mg into the muscle once as needed (for allergic reaction).     [provider]  famotidine (PEPCID) 20 MG tablet Take 1 tablet (20 mg total) by mouth 2 (two) times daily. Patient not taking:  Reported on 05/17/2018 03/18/18   Delia Heady, PA-C  fluticasone (FLONASE) 50 MCG/ACT nasal spray Place 1 spray into both nostrils daily as needed for allergies or rhinitis.    [provider]  hydrocortisone 1 % ointment Apply 1 application topically daily. 05/20/15   Truett Mainland, DO  ibuprofen (ADVIL,MOTRIN) 200 MG tablet Take 400 mg by mouth as needed for moderate pain.    [provider]  ibuprofen (ADVIL,MOTRIN) 600 MG tablet Take 1 tablet (600 mg total) by mouth every 6 (six) hours as needed. 05/15/18   Maczis, Barth Kirks, PA-C  mupirocin cream (BACTROBAN) 2 % Apply 1 application topically 2 (two) times daily. 03/13/19   Alveria Apley, PA-C  naproxen sodium (ANAPROX) 550 MG tablet Take 1 tablet (550 mg total) by mouth 2 (two) times daily with a meal. As needed for pain Patient not taking: Reported on 01/18/2018 04/22/16   Anyanwu, Sallyanne Havers, MD  ondansetron (ZOFRAN ODT) 4 MG disintegrating tablet Take 1 tablet (4 mg total) by mouth every 8 (eight) hours as needed for nausea or vomiting. Patient not  taking: Reported on 05/17/2018 03/18/18   Delia Heady, PA-C  oseltamivir (TAMIFLU) 75 MG capsule Take 1 capsule (75 mg total) by mouth every 12 (twelve) hours. 05/15/18   Maczis, Barth Kirks, PA-C  predniSONE (STERAPRED UNI-PAK 21 TAB) 10 MG (21) TBPK tablet Take by mouth daily. Take 6 tabs by mouth daily  for 1 day, then 5 tabs for 1 day, then 4 tabs for 1 day, then 3 tabs for 1 day, 2 tabs for 1 day, then 1 tab by mouth daily for 1 day Patient not taking: Reported on 01/18/2018 02/23/17   Multani, Bhupinder, NP  PROVENTIL HFA 108 (90 Base) MCG/ACT inhaler INHALE 2 PUFFS INTO THE LUNGS EVERY FOUR HOURS AS NEEDED FOR WHEEZING OR SHORTNESS OF BREATH 11/15/18   Truett Mainland, DO    Family History Family History  Problem Relation Age of Onset  . Diabetes Mother   . Hypertension Mother   . Heart disease Mother     Social History Social History   Tobacco Use  . Smoking  status: Former Smoker    Packs/day: 0.50    Types: Cigarettes    Quit date: 06/11/2017    Years since quitting: 1.7  . Smokeless tobacco: Never Used  Substance Use Topics  . Alcohol use: No  . Drug use: Yes    Frequency: 5.0 times per week    Types: Marijuana     Allergies   Peanut-containing drug products and Peanuts [peanut oil]   Review of Systems Review of Systems  Constitutional: Negative for appetite change and fatigue.  HENT: Negative for congestion, ear discharge and sinus pressure.   Eyes: Negative for discharge.  Respiratory: Negative for cough.   Cardiovascular: Negative for chest pain.  Gastrointestinal: Negative for abdominal pain and diarrhea.  Genitourinary: Negative for frequency and hematuria.  Musculoskeletal: Negative for back pain.  Skin: Positive for rash.  Neurological: Negative for seizures and headaches.  Psychiatric/Behavioral: Negative for hallucinations.     Physical Exam Updated Vital Signs BP (!) 136/105 (BP Location: Right Arm)   Pulse 87   Temp 98.7 F (37.1 C) (Oral)   Resp 20   Ht 5\' 7"  (1.702 m)   Wt 74.8 kg   SpO2 100%   BMI 25.84 kg/m   Physical Exam Vitals signs reviewed.  Constitutional:      Appearance: She is well-developed.  HENT:     Head: Normocephalic.     Nose: Nose normal.  Eyes:     General: No scleral icterus.    Conjunctiva/sclera: Conjunctivae normal.  Neck:     Musculoskeletal: Neck supple.     Thyroid: No thyromegaly.  Cardiovascular:     Rate and Rhythm: Normal rate and regular rhythm.     Heart sounds: No murmur. No friction rub. No gallop.   Pulmonary:     Breath sounds: No stridor. No wheezing or rales.  Chest:     Chest wall: No tenderness.  Abdominal:     General: There is no distension.     Tenderness: There is no abdominal tenderness. There is no rebound.  Musculoskeletal: Normal range of motion.  Lymphadenopathy:     Cervical: No cervical adenopathy.  Skin:    Findings: No erythema or  rash.     Comments: Patient has worsening eczema to face with bad acne and appears to have an infection in her left upper lid.  Neurological:     Mental Status: She is oriented to person, place, and time.  Motor: No abnormal muscle tone.     Coordination: Coordination normal.  Psychiatric:        Behavior: Behavior normal.      ED Treatments / Results  Labs (all labs ordered are listed, but only abnormal results are displayed) Labs Reviewed - No data to display  EKG None  Radiology No results found.  Procedures Procedures (including critical care time)  Medications Ordered in ED Medications  methylPREDNISolone sodium succinate (SOLU-MEDROL) 125 mg/2 mL injection 125 mg (has no administration in time range)     Initial Impression / Assessment and Plan / ED Course  I have reviewed the triage vital signs and the nursing notes.  Pertinent labs & imaging results that were available during my care of the patient were reviewed by me and considered in my medical decision making (see chart for details).        Patient is told to stop using hydrocortisone cream and Bactroban she is placed on doxycycline and will be given an injection of Solu-Medrol.  She is referred to ophthalmology for the eyelid infection.  Patient also given erythromycin ointment for her eyelid  Final Clinical Impressions(s) / ED Diagnoses   Final diagnoses:  Impetigo    ED Discharge Orders         Ordered    doxycycline (VIBRAMYCIN) 100 MG capsule  2 times daily     03/25/19 1823           Milton Ferguson, MD 03/25/19 1831

## 2019-03-25 NOTE — ED Triage Notes (Signed)
Pt had an abscess on face last week in return an eczema flair up and now a ? Stye on upper rt eye lid, eczema now intermittent on body

## 2019-03-28 ENCOUNTER — Encounter (HOSPITAL_COMMUNITY): Payer: Self-pay | Admitting: Emergency Medicine

## 2019-03-28 ENCOUNTER — Emergency Department (HOSPITAL_COMMUNITY)
Admission: EM | Admit: 2019-03-28 | Discharge: 2019-03-28 | Disposition: A | Discharge status: Home / Self Care | Payer: Medicaid Other | Attending: Emergency Medicine | Admitting: Emergency Medicine

## 2019-03-28 DIAGNOSIS — R109 Unspecified abdominal pain: Secondary | ICD-10-CM | POA: Insufficient documentation

## 2019-03-28 DIAGNOSIS — Z5321 Procedure and treatment not carried out due to patient leaving prior to being seen by health care provider: Secondary | ICD-10-CM | POA: Insufficient documentation

## 2019-03-28 NOTE — ED Triage Notes (Signed)
Per EMS-states she took an antibiotic this am for skin infection-states by the time she got to work she stated having nausea and sharp abdominal pain-dry heaved with EMS

## 2019-03-28 NOTE — ED Notes (Signed)
No answer from patient when called in lobby. No sight of patient in or around lobby/triage area.

## 2019-03-28 NOTE — ED Notes (Signed)
I called patient to collect labs and no one responded 

## 2019-04-29 ENCOUNTER — Ambulatory Visit: Payer: Medicaid Other | Attending: Family Medicine | Admitting: Family Medicine

## 2019-05-08 ENCOUNTER — Ambulatory Visit: Payer: Self-pay | Attending: Family Medicine | Admitting: Family Medicine

## 2019-05-08 ENCOUNTER — Other Ambulatory Visit: Payer: Self-pay

## 2019-05-08 DIAGNOSIS — L308 Other specified dermatitis: Secondary | ICD-10-CM

## 2019-05-08 DIAGNOSIS — J4520 Mild intermittent asthma, uncomplicated: Secondary | ICD-10-CM

## 2019-05-08 DIAGNOSIS — N898 Other specified noninflammatory disorders of vagina: Secondary | ICD-10-CM

## 2019-05-08 MED ORDER — METRONIDAZOLE 0.75 % VA GEL: 1.0000 | Freq: Every day | VAGINAL | 0 refills | Status: DC

## 2019-05-08 MED ORDER — FLUCONAZOLE 150 MG PO TABS: 150.0000 mg | ORAL_TABLET | Freq: Once | ORAL | 0 refills | Status: AC

## 2019-05-08 MED ORDER — TRIAMCINOLONE ACETONIDE 0.1 % EX CREA: 1.0000 "application " | TOPICAL_CREAM | Freq: Two times a day (BID) | CUTANEOUS | 1 refills | Status: DC

## 2019-05-08 NOTE — Progress Notes (Signed)
Patient has been called and DOB has been verified. Patient has been screened and transferred to PCP to start phone visit.     

## 2019-05-08 NOTE — Progress Notes (Signed)
Virtual Visit via Telephone Note  I connected with Ariana Young, on 05/08/2019 at 1:33 PM by telephone due to the COVID-19 pandemic and verified that I am speaking with the correct person using two identifiers.   Consent: I discussed the limitations, risks, security and privacy concerns of performing an evaluation and management service by telephone and the availability of in person appointments. I also discussed with the patient that there may be a patient responsible charge related to this service. The patient expressed understanding and agreed to proceed.   Location of Patient: Home  Location of Provider: Clinic   Persons participating in Telemedicine visit: Torin Katara Detzel Farrington-CMA Dr. Margarita Rana     History of Present Illness: 33 year old female with a history of asthma who presents today to establish care.  Her PCP was Dr Doylene Canard and she has not had any since then. States she was diagnosed with Eczema and is having patches under her arms, pelvis and her face is breaking out. Using Hydrocortisone, Aquaphor with minimal improvement.  She completed a course of Doxycycline which she received from the ED as treatment for impetigo and has noticed a vaginal discharge with a foul smell. Discharge is whitish brown with associated vaginal itching. She is not sexually active. Symptoms have been present for a week.  She also has Asthma but this is controlled and she uses albuterol inhaler intermittently.  Past Medical History:  Diagnosis Date  . Asthma    uses inhaler as needed  . 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 Eczema    Allergies  Allergen Reactions  . Peanut-Containing Drug Products Anaphylaxis and Hives  . Peanuts [Peanut Oil] Anaphylaxis and Hives    Current Outpatient Medications on File Prior to Visit  Medication Sig Dispense Refill  . doxycycline (VIBRAMYCIN) 100 MG capsule Take 1 capsule (100 mg total) by mouth 2 (two) times daily. One po bid x 7  days 14 capsule 0  . EPINEPHrine (EPIPEN) 0.3 mg/0.3 mL DEVI Inject 0.3 mg into the muscle once as needed (for allergic reaction).     Marland Kitchen PROVENTIL HFA 108 (90 Base) MCG/ACT inhaler INHALE 2 PUFFS INTO THE LUNGS EVERY FOUR HOURS AS NEEDED FOR WHEEZING OR SHORTNESS OF BREATH 40.2 g 2  . benzonatate (TESSALON) 100 MG capsule Take 1 capsule (100 mg total) by mouth every 8 (eight) hours. (Patient not taking: Reported on 05/08/2019) 21 capsule 0  . erythromycin ophthalmic ointment Place a 1/2 inch ribbon of ointment into the upper eyelid.  Use qid (Patient not taking: Reported on 05/08/2019) 1 g 0  . famotidine (PEPCID) 20 MG tablet Take 1 tablet (20 mg total) by mouth 2 (two) times daily. (Patient not taking: Reported on 05/17/2018) 30 tablet 0  . fluticasone (FLONASE) 50 MCG/ACT nasal spray Place 1 spray into both nostrils daily as needed for allergies or rhinitis.    . hydrocortisone 1 % ointment Apply 1 application topically daily. (Patient not taking: Reported on 05/08/2019) 30 g 2  . ibuprofen (ADVIL,MOTRIN) 200 MG tablet Take 400 mg by mouth as needed for moderate pain.    Marland Kitchen ibuprofen (ADVIL,MOTRIN) 600 MG tablet Take 1 tablet (600 mg total) by mouth every 6 (six) hours as needed. (Patient not taking: Reported on 05/08/2019) 30 tablet 0  . mupirocin cream (BACTROBAN) 2 % Apply 1 application topically 2 (two) times daily. (Patient not taking: Reported on 05/08/2019) 15 g 0  . naproxen sodium (ANAPROX) 550 MG tablet Take 1 tablet (550 mg  total) by mouth 2 (two) times daily with a meal. As needed for pain (Patient not taking: Reported on 01/18/2018) 60 tablet 2  . ondansetron (ZOFRAN ODT) 4 MG disintegrating tablet Take 1 tablet (4 mg total) by mouth every 8 (eight) hours as needed for nausea or vomiting. (Patient not taking: Reported on 05/17/2018) 5 tablet 0  . oseltamivir (TAMIFLU) 75 MG capsule Take 1 capsule (75 mg total) by mouth every 12 (twelve) hours. (Patient not taking: Reported on 05/08/2019)  10 capsule 0  . predniSONE (STERAPRED UNI-PAK 21 TAB) 10 MG (21) TBPK tablet Take by mouth daily. Take 6 tabs by mouth daily  for 1 day, then 5 tabs for 1 day, then 4 tabs for 1 day, then 3 tabs for 1 day, 2 tabs for 1 day, then 1 tab by mouth daily for 1 day (Patient not taking: Reported on 01/18/2018) 21 tablet 0   Current Facility-Administered Medications on File Prior to Visit  Medication Dose Route Frequency Provider Last Rate Last Admin  . medroxyPROGESTERone (DEPO-PROVERA) injection 150 mg  150 mg Intramuscular Q90 days Anyanwu, Ugonna A, MD   150 mg at 05/20/15 1304    Observations/Objective: Awake, alert, oriented x3 Not in acute distress  Assessment and Plan: 1. Mild intermittent asthma without complication Controlled Continue MDI as needed  2. Other eczema Uncontrolled We will try medium potency steroid topically Discussed skin care related to eczema-avoid hot baths, avoid frequent baths, use moisturizers - triamcinolone cream (KENALOG) 0.1 %; Apply 1 application topically 2 (two) times daily.  Dispense: 80 g; Refill: 1  3. Vaginal discharge We will treat presumptively for BV and vaginal candidiasis given recent completion of antibiotics - metroNIDAZOLE (METROGEL VAGINAL) 0.75 % vaginal gel; Place 1 Applicatorful vaginally at bedtime.  Dispense: 70 g; Refill: 0 - fluconazole (DIFLUCAN) 150 MG tablet; Take 1 tablet (150 mg total) by mouth once for 1 dose.  Dispense: 1 tablet; Refill: 0   Follow Up Instructions: Return if symptoms worsen or fail to improve.    I discussed the assessment and treatment plan with the patient. The patient was provided an opportunity to ask questions and all were answered. The patient agreed with the plan and demonstrated an understanding of the instructions.   The patient was advised to call back or seek an in-person evaluation if the symptoms worsen or if the condition fails to improve as anticipated.     I provided 18 minutes total of  non-face-to-face time during this encounter including median intraservice time, reviewing previous notes, labs, imaging, medications, management and patient verbalized understanding.     Charlott Rakes, MD, FAAFP. [facility] [place], [place]   05/08/2019, 1:33 PM

## 2019-05-13 ENCOUNTER — Other Ambulatory Visit: Payer: Self-pay

## 2019-05-13 ENCOUNTER — Emergency Department (HOSPITAL_COMMUNITY)
Admission: EM | Admit: 2019-05-13 | Discharge: 2019-05-13 | Disposition: A | Discharge status: Home / Self Care | Payer: Medicaid Other | Attending: Emergency Medicine | Admitting: Emergency Medicine

## 2019-05-13 ENCOUNTER — Telehealth: Payer: Self-pay | Admitting: Family Medicine

## 2019-05-13 ENCOUNTER — Ambulatory Visit: Payer: Self-pay | Admitting: Family Medicine

## 2019-05-13 ENCOUNTER — Encounter (HOSPITAL_COMMUNITY): Payer: Self-pay

## 2019-05-13 DIAGNOSIS — Z91010 Allergy to peanuts: Secondary | ICD-10-CM | POA: Insufficient documentation

## 2019-05-13 DIAGNOSIS — Z87891 Personal history of nicotine dependence: Secondary | ICD-10-CM | POA: Insufficient documentation

## 2019-05-13 DIAGNOSIS — R220 Localized swelling, mass and lump, head: Secondary | ICD-10-CM | POA: Insufficient documentation

## 2019-05-13 DIAGNOSIS — L309 Dermatitis, unspecified: Secondary | ICD-10-CM

## 2019-05-13 DIAGNOSIS — L308 Other specified dermatitis: Secondary | ICD-10-CM

## 2019-05-13 MED ORDER — HYDROCORTISONE 2.5 % EX LOTN: TOPICAL_LOTION | Freq: Two times a day (BID) | CUTANEOUS | 0 refills | Status: DC

## 2019-05-13 MED ORDER — TRIAMCINOLONE ACETONIDE 0.1 % EX CREA: 1.0000 "application " | TOPICAL_CREAM | Freq: Two times a day (BID) | CUTANEOUS | 1 refills | Status: DC

## 2019-05-13 MED ORDER — DOXYCYCLINE HYCLATE 100 MG PO CAPS: 100.0000 mg | ORAL_CAPSULE | Freq: Two times a day (BID) | ORAL | 0 refills | Status: DC

## 2019-05-13 NOTE — ED Triage Notes (Signed)
Patient c/o facial swelling and rash on the left side of the face, chest and axillary area. Patient states she was seen a month ago for the same =, but was on the right side. patient states pain when she tries to talk.

## 2019-05-13 NOTE — ED Provider Notes (Signed)
[name] Provider Note   CSN: [id number] Arrival date & time: 05/13/19  1740     History Chief Complaint  Patient presents with  . Rash  . Facial Swelling    Ariana Young is a 33 y.o. female with history of eczema, asthma who presents with rash to her face, chest, axilla, waist, leg.  It is itchy.  She has had associated swelling to her left side of face and eye.  She reports this is improved when she put ice on it.  She reports she had a little blurry vision in her left eye when she woke up associated with the swelling, but this improved in an hour.  She denies any vision changes now.  She reports she did change detergent recently, but realized it may be causing her eczema to flare, so she changed back.  Patient was recently seen for similar symptoms and was given doxycycline.  Per chart review, patient was also seen by her PCP and prescribed triamcinolone, however patient states she is only taking hydrocortisone at this time.  She denies any fever, chest pain, difficulty breathing.  HPI     Past Medical History:  Diagnosis Date  . Asthma    uses inhaler as needed  . 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 Eczema     There are no problems to display for this patient.   Past Surgical History:  Procedure Laterality Date  . OVARIAN CYST SURGERY  2008   ELAP, removal of bilateral dermoid cysts  . WISDOM TOOTH EXTRACTION       OB History    Gravida  1   Para  1   Term  1   Preterm  0   AB  0   Living  1     SAB  0   TAB  0   Ectopic  0   Multiple  0   Live Births  1           Family History  Problem Relation Age of Onset  . Diabetes Mother   . Hypertension Mother   . Heart disease Mother     Social History   Tobacco Use  . Smoking status: Former Smoker    Packs/day: 0.50    Types: Cigarettes    Quit date: 06/11/2017    Years since quitting: 1.9  . Smokeless tobacco: Never Used  Substance Use  Topics  . Alcohol use: No  . Drug use: Yes    Frequency: 5.0 times per week    Types: Marijuana    Home Medications Prior to Admission medications   Medication Sig Start Date End Date Taking? Authorizing Provider  doxycycline (VIBRAMYCIN) 100 MG capsule Take 1 capsule (100 mg total) by mouth 2 (two) times daily. 05/13/19   Leith Szafranski, Bea Graff, PA-C  EPINEPHrine (EPIPEN) 0.3 mg/0.3 mL DEVI Inject 0.3 mg into the muscle once as needed (for allergic reaction).     [provider]  hydrocortisone 2.5 % lotion Apply topically 2 (two) times daily. Use on face. 05/13/19   Dajour Pierpoint, Bea Graff, PA-C  metroNIDAZOLE (METROGEL VAGINAL) 0.75 % vaginal gel Place 1 Applicatorful vaginally at bedtime. 05/08/19   Charlott Rakes, MD  triamcinolone cream (KENALOG) 0.1 % Apply 1 application topically 2 (two) times daily. Avoid face. 05/13/19   Frederica Kuster, PA-C    Allergies    Peanut-containing drug products and Peanuts [peanut oil]  Review of Systems   Review  of Systems  Constitutional: Negative for fever.  HENT: Positive for facial swelling.   Eyes: Positive for visual disturbance (resolved).  Respiratory: Negative for shortness of breath.   Cardiovascular: Negative for chest pain.  Skin: Positive for rash.    Physical Exam Updated Vital Signs BP 116/76 (BP Location: Left Arm)   Pulse 77   Temp 98.7 F (37.1 C) (Oral)   Resp 16   Ht 5\' 7"  (1.702 m)   Wt 74.8 kg   LMP 05/13/2019   SpO2 95%   BMI 25.84 kg/m   Physical Exam Vitals and nursing note reviewed.  Constitutional:      General: She is not in acute distress.    Appearance: She is well-developed. She is not diaphoretic.  HENT:     Head: Normocephalic and atraumatic.     Mouth/Throat:     Pharynx: No oropharyngeal exudate.  Eyes:     General: No scleral icterus.       Right eye: No discharge.        Left eye: No discharge.     Extraocular Movements: Extraocular movements intact.     Conjunctiva/sclera:  Conjunctivae normal.     Pupils: Pupils are equal, round, and reactive to light.     Comments: No difficulty with EOMs  Neck:     Thyroid: No thyromegaly.  Cardiovascular:     Rate and Rhythm: Normal rate and regular rhythm.     Heart sounds: Normal heart sounds. No murmur. No friction rub. No gallop.   Pulmonary:     Effort: Pulmonary effort is normal. No respiratory distress.     Breath sounds: Normal breath sounds. No stridor. No wheezing or rales.  Abdominal:     General: Bowel sounds are normal. There is no distension.     Palpations: Abdomen is soft.     Tenderness: There is no abdominal tenderness. There is no guarding or rebound.  Musculoskeletal:     Cervical back: Normal range of motion and neck supple.  Lymphadenopathy:     Cervical: No cervical adenopathy.  Skin:    General: Skin is warm and dry.     Coloration: Skin is not pale.     Findings: No rash.     Comments: Scaly rash with erythematous base to bilateral eyes, axilla, waist area, base of neck and chest, and legs; no significant extension of erythema, no drainage On the face, there is multiple areas of acne with erythematous, no abscess In the right axilla, there are 2 pea-sized hard nodules, could be lymph nodes or early abscess There is one similar pea-sized hard nodule in the left axilla  Neurological:     Mental Status: She is alert.     Coordination: Coordination normal.     ED Results / Procedures / Treatments   Labs (all labs ordered are listed, but only abnormal results are displayed) Labs Reviewed - No data to display  EKG None  Radiology No results found.  Procedures Procedures (including critical care time)  Medications Ordered in ED Medications - No data to display  ED Course  I have reviewed the triage vital signs and the nursing notes.  Pertinent labs & imaging results that were available during my care of the patient were reviewed by me and considered in my medical decision making  (see chart for details).    MDM Rules/Calculators/A&P  Patient presenting with flare of eczema.  There may be secondary infection on the face and early abscess on the axillas, however these could be lymph nodes.  Will cover with doxycycline.  We will also recommend triamcinolone on the body and hydrocortisone 2.5% on the face.  Will refer to dermatology.  No abscesses indicating incision and drainage today.  No signs of orbital cellulitis.  I feel patient swelling around the eyes mostly related to her eczema, however doxycycline to cover for any secondary infection.  Return precautions discussed including worsening swelling, redness, difficulty moving her eyes, vision changes, or increasing size of nodules in the axilla.  She understands and agrees with plan.  Patient vitals stable throughout ED course and discharged in satisfactory condition.  Final Clinical Impression(s) / ED Diagnoses Final diagnoses:  Eczema, unspecified type    Rx / DC Orders ED Discharge Orders         Ordered    triamcinolone cream (KENALOG) 0.1 %  2 times daily     05/13/19 2013    doxycycline (VIBRAMYCIN) 100 MG capsule  2 times daily     05/13/19 2013    hydrocortisone 2.5 % lotion  2 times daily     05/13/19 2013           Caryl Ada 05/13/19 2022    Davonna Belling, MD 05/13/19 2243

## 2019-05-13 NOTE — Progress Notes (Signed)
Patient ID: Ariana Young, female   DOB: 1986-01-13, 33 y.o.   MRN: [id number]   Patient was placed on schedule and has complaint of facial swelling, blurry vision and eye swelling.  Discussed with the patient that her condition was not appropriate to be evaluated by a telemedicine visit and that she should go to urgent care or call the office to see if there is an appointment today for an in person visit.  Message was sent to patient's primary care provider so that her PCP can see if there is an opening in which patient can be scheduled today.  Patient expressed understanding.

## 2019-05-13 NOTE — Discharge Instructions (Addendum)
Use triamcinolone twice daily on the areas of eczema on your body.  Avoid your face with this medication.  Apply hydrocortisone twice daily to your face.  Take doxycycline as prescribed until completed.  Please follow-up with a dermatologist for further evaluation and treatment of your symptoms.  Please return the emergency department you develop any worsening swelling, redness to your eye, vision changes, or any other concerning symptoms.

## 2019-05-22 ENCOUNTER — Telehealth: Payer: Self-pay | Admitting: Family Medicine

## 2019-05-22 DIAGNOSIS — L308 Other specified dermatitis: Secondary | ICD-10-CM

## 2019-05-22 NOTE — Telephone Encounter (Signed)
[facility] called [facility] the fax number is [phone]. The hospital didn't send the referral to who takes her insurance

## 2019-05-22 NOTE — Telephone Encounter (Signed)
Done

## 2019-05-22 NOTE — Telephone Encounter (Signed)
Patient is requesting a dermatology referral for her eczema.

## 2019-05-23 NOTE — Telephone Encounter (Signed)
Patient was called and she does not have a voicemail set up to leave a message.

## 2019-05-24 ENCOUNTER — Emergency Department (HOSPITAL_COMMUNITY)
Admission: EM | Admit: 2019-05-24 | Discharge: 2019-05-24 | Disposition: A | Discharge status: Home / Self Care | Payer: Medicaid Other | Attending: Emergency Medicine | Admitting: Emergency Medicine

## 2019-05-24 ENCOUNTER — Encounter (HOSPITAL_COMMUNITY): Payer: Self-pay | Admitting: Emergency Medicine

## 2019-05-24 ENCOUNTER — Other Ambulatory Visit: Payer: Self-pay

## 2019-05-24 DIAGNOSIS — Z91010 Allergy to peanuts: Secondary | ICD-10-CM | POA: Insufficient documentation

## 2019-05-24 DIAGNOSIS — L03116 Cellulitis of left lower limb: Secondary | ICD-10-CM | POA: Insufficient documentation

## 2019-05-24 DIAGNOSIS — J45909 Unspecified asthma, uncomplicated: Secondary | ICD-10-CM | POA: Insufficient documentation

## 2019-05-24 DIAGNOSIS — Z87891 Personal history of nicotine dependence: Secondary | ICD-10-CM | POA: Insufficient documentation

## 2019-05-24 MED ORDER — HYDROCODONE-ACETAMINOPHEN 5-325 MG PO TABS: 1.0000 | ORAL_TABLET | Freq: Four times a day (QID) | ORAL | 0 refills | Status: DC | PRN

## 2019-05-24 MED ORDER — DOXYCYCLINE HYCLATE 100 MG PO TABS
100.0000 mg | ORAL_TABLET | Freq: Once | ORAL | Status: AC
  Administered 2019-05-24: 100 mg via ORAL
  Filled 2019-05-24: qty 1

## 2019-05-24 MED ORDER — OXYCODONE HCL 5 MG PO TABS
5.0000 mg | ORAL_TABLET | Freq: Once | ORAL | Status: AC
  Administered 2019-05-24: 5 mg via ORAL
  Filled 2019-05-24: qty 1

## 2019-05-24 MED ORDER — LIDOCAINE-EPINEPHRINE (PF) 2 %-1:200000 IJ SOLN
INTRAMUSCULAR | Status: AC
  Filled 2019-05-24: qty 20

## 2019-05-24 MED ORDER — ACETAMINOPHEN 325 MG PO TABS
650.0000 mg | ORAL_TABLET | Freq: Once | ORAL | Status: AC
  Administered 2019-05-24: 06:00:00 650 mg via ORAL
  Filled 2019-05-24: qty 2

## 2019-05-24 MED ORDER — DOXYCYCLINE HYCLATE 100 MG PO CAPS: 100.0000 mg | ORAL_CAPSULE | Freq: Two times a day (BID) | ORAL | 0 refills | Status: DC

## 2019-05-24 MED ORDER — ICHTHAMMOL 10 % EX OINT: TOPICAL_OINTMENT | CUTANEOUS | 0 refills | Status: DC

## 2019-05-24 NOTE — Discharge Instructions (Signed)

## 2019-05-24 NOTE — ED Provider Notes (Signed)
[facility] DEPT Provider Note   CSN: [id number] Arrival date & time: 05/24/19  [id number]     History Chief Complaint  Patient presents with  . Abscess    Effa Brozek is a 34 y.o. female who presents emergency department chief complaint of left lower extremity pain.  She has a past medical history of severe atopic dermatitis.  Patient states that she developed small area of erythema on the left lower extremity over the past several days which has been progressively worsening and is now severely painful with local heat, redness swelling.  She rates her pain as severe.  She denies fever or chills.  She has a previous history of facial impetigo diagnosed earlier this year which was treated with doxycycline and bacitracin.  HPI     Past Medical History:  Diagnosis Date  . Asthma    uses inhaler as needed  . 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 Eczema     There are no problems to display for this patient.   Past Surgical History:  Procedure Laterality Date  . OVARIAN CYST SURGERY  2008   ELAP, removal of bilateral dermoid cysts  . WISDOM TOOTH EXTRACTION       OB History    Gravida  1   Para  1   Term  1   Preterm  0   AB  0   Living  1     SAB  0   TAB  0   Ectopic  0   Multiple  0   Live Births  1           Family History  Problem Relation Age of Onset  . Diabetes Mother   . Hypertension Mother   . Heart disease Mother     Social History   Tobacco Use  . Smoking status: Former Smoker    Packs/day: 0.50    Types: Cigarettes    Quit date: 06/11/2017    Years since quitting: 1.9  . Smokeless tobacco: Never Used  Substance Use Topics  . Alcohol use: No  . Drug use: Yes    Frequency: 5.0 times per week    Types: Marijuana    Home Medications Prior to Admission medications   Medication Sig Start Date End Date Taking? Authorizing Provider  doxycycline (VIBRAMYCIN) 100 MG capsule Take 1 capsule (100 mg  total) by mouth 2 (two) times daily. 05/13/19   Law, Bea Graff, PA-C  EPINEPHrine (EPIPEN) 0.3 mg/0.3 mL DEVI Inject 0.3 mg into the muscle once as needed (for allergic reaction).     [provider]  hydrocortisone 2.5 % lotion Apply topically 2 (two) times daily. Use on face. 05/13/19   Law, Bea Graff, PA-C  metroNIDAZOLE (METROGEL VAGINAL) 0.75 % vaginal gel Place 1 Applicatorful vaginally at bedtime. 05/08/19   Charlott Rakes, MD  triamcinolone cream (KENALOG) 0.1 % Apply 1 application topically 2 (two) times daily. Avoid face. 05/13/19   Frederica Kuster, PA-C    Allergies    Peanut-containing drug products and Peanuts [peanut oil]  Review of Systems   Review of Systems Ten systems reviewed and are negative for acute change, except as noted in the HPI.   Physical Exam Updated Vital Signs BP 115/81   Pulse 77   Temp 99 F (37.2 C) (Oral)   Resp 18   LMP 05/13/2019   SpO2 100%   Physical Exam Vitals and nursing note reviewed.  Constitutional:  General: She is not in acute distress.    Appearance: She is well-developed. She is not diaphoretic.  HENT:     Head: Normocephalic and atraumatic.  Eyes:     General: No scleral icterus.    Conjunctiva/sclera: Conjunctivae normal.  Cardiovascular:     Rate and Rhythm: Normal rate and regular rhythm.     Heart sounds: Normal heart sounds. No murmur. No friction rub. No gallop.   Pulmonary:     Effort: Pulmonary effort is normal. No respiratory distress.     Breath sounds: Normal breath sounds.  Abdominal:     General: Bowel sounds are normal. There is no distension.     Palpations: Abdomen is soft. There is no mass.     Tenderness: There is no abdominal tenderness. There is no guarding.  Musculoskeletal:     Cervical back: Normal range of motion.  Skin:    General: Skin is warm and dry.     Findings: Erythema and rash present.     Comments: Multiple areas of hypertrophic, hyperpigmented skin changes around  the eyes, flexor surfaces, wrists.  On the left lower extremity there is area of small erythematous punctate papules with excoriation.  On the medial left lower extremity there is a 10 cm erythematous, tender, hot area without lymphangitis with central pustule and no fluctuance.  Neurological:     Mental Status: She is alert and oriented to person, place, and time.  Psychiatric:        Behavior: Behavior normal.     ED Results / Procedures / Treatments   Labs (all labs ordered are listed, but only abnormal results are displayed) Labs Reviewed - No data to display  EKG None  Radiology No results found.  Procedures Procedures (including critical care time)  Medications Ordered in ED Medications  lidocaine-EPINEPHrine (XYLOCAINE W/EPI) 2 %-1:200000 (PF) injection (has no administration in time range)  acetaminophen (TYLENOL) tablet 650 mg (650 mg Oral Given 05/24/19 [id number])    ED Course  I have reviewed the triage vital signs and the nursing notes.  Pertinent labs & imaging results that were available during my care of the patient were reviewed by me and considered in my medical decision making (see chart for details).    MDM Rules/Calculators/A&P                       Patient here with evidence of Cellulitis. Patient does not have evidence of sepsis, nec fascitis,  Or abscess. Will treat with doxycycline, ichthammol ointment and oral pain medications. Discussed return precautions.  The patient appears reasonably screened and/or stabilized for discharge and I doubt any other medical condition or other [facility] requiring further screening, evaluation, or treatment in the ED at this time prior to discharge.  Final Clinical Impression(s) / ED Diagnoses Final diagnoses:  None    Rx / DC Orders ED Discharge Orders    None       Margarita Mail, PA-C 05/24/19 0730    Nat Christen, MD 05/24/19 [phone]

## 2019-05-24 NOTE — ED Triage Notes (Signed)
Pt arrives via [facility] for insect bite Occurred 24 hours ago, swollen and red Burning sensation

## 2019-06-19 ENCOUNTER — Other Ambulatory Visit: Payer: Self-pay

## 2019-06-19 ENCOUNTER — Ambulatory Visit: Payer: Medicaid Other | Attending: Family Medicine | Admitting: Family Medicine

## 2019-06-19 VITALS — BP 119/80 | HR 66

## 2019-06-19 DIAGNOSIS — L308 Other specified dermatitis: Secondary | ICD-10-CM

## 2019-06-19 DIAGNOSIS — Z23 Encounter for immunization: Secondary | ICD-10-CM

## 2019-06-19 MED ORDER — HYDROXYZINE HCL 25 MG PO TABS: 25.0000 mg | ORAL_TABLET | Freq: Every evening | ORAL | 1 refills | Status: DC | PRN

## 2019-06-19 NOTE — Progress Notes (Signed)
Subjective:  Patient ID: Ariana Young, female    DOB: [birth year]  Age: 34 y.o. MRN: [id number]  CC: Eczema   HPI Babbette Prusha is a 34 year old female with a history of eczema, asthma who presents today for follow-up visit. She was prescribed triamcinolone which she is yet to pick up from the pharmacy but has been using hydrocortisone which has been ineffective.  She finds that she is itching in her sleep as well. Asthma has been stable and she is currently not on any bronchodilator and does not need one. She has no additional concerns today.  Past Medical History:  Diagnosis Date  . Asthma    uses inhaler as needed  . 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 Eczema     Past Surgical History:  Procedure Laterality Date  . OVARIAN CYST SURGERY  2008   ELAP, removal of bilateral dermoid cysts  . WISDOM TOOTH EXTRACTION      Family History  Problem Relation Age of Onset  . Diabetes Mother   . Hypertension Mother   . Heart disease Mother     Allergies  Allergen Reactions  . Peanut-Containing Drug Products Anaphylaxis and Hives  . Peanuts [Peanut Oil] Anaphylaxis and Hives    Outpatient Medications Prior to Visit  Medication Sig Dispense Refill  . EPINEPHrine (EPIPEN) 0.3 mg/0.3 mL DEVI Inject 0.3 mg into the muscle once as needed (for allergic reaction).     . triamcinolone cream (KENALOG) 0.1 % Apply 1 application topically 2 (two) times daily. Avoid face. 80 g 1  . doxycycline (VIBRAMYCIN) 100 MG capsule Take 1 capsule (100 mg total) by mouth 2 (two) times daily. (Patient not taking: Reported on 06/19/2019) 20 capsule 0  . HYDROcodone-acetaminophen (NORCO) 5-325 MG tablet Take 1 tablet by mouth every 6 (six) hours as needed for severe pain. (Patient not taking: Reported on 06/19/2019) 6 tablet 0  . hydrocortisone 2.5 % lotion Apply topically 2 (two) times daily. Use on face. (Patient not taking: Reported on 06/19/2019) 59 mL 0  . Ichthammol 10 % OINT Apply a small amount  to the affected area 2 times a day (Patient not taking: Reported on 06/19/2019) 28.3 g 0  . metroNIDAZOLE (METROGEL VAGINAL) 0.75 % vaginal gel Place 1 Applicatorful vaginally at bedtime. (Patient not taking: Reported on 06/19/2019) 70 g 0   No facility-administered medications prior to visit.     ROS Review of Systems  Constitutional: Negative for activity change, appetite change and fatigue.  HENT: Negative for congestion, sinus pressure and sore throat.   Eyes: Negative for visual disturbance.  Respiratory: Negative for cough, chest tightness, shortness of breath and wheezing.   Cardiovascular: Negative for chest pain and palpitations.  Gastrointestinal: Negative for abdominal distention, abdominal pain and constipation.  Endocrine: Negative for polydipsia.  Genitourinary: Negative for dysuria and frequency.  Musculoskeletal: Negative for arthralgias and back pain.  Skin: Positive for rash.  Neurological: Negative for tremors, light-headedness and numbness.  Hematological: Does not bruise/bleed easily.  Psychiatric/Behavioral: Negative for agitation and behavioral problems.    Objective:  BP 119/80   Pulse 66   SpO2 100%   BP/Weight 06/19/2019 05/24/2019 [id number]  Systolic BP 123456 [id number]  Diastolic BP 80 83 76  Wt. (Lbs) - - 165  BMI - - 25.84      Physical Exam Constitutional:      Appearance: She is well-developed.  Neck:     Vascular: No JVD.  Cardiovascular:  Rate and Rhythm: Normal rate.     Heart sounds: Normal heart sounds. No murmur.  Pulmonary:     Effort: Pulmonary effort is normal.     Breath sounds: Normal breath sounds. No wheezing or rales.  Chest:     Chest wall: No tenderness.  Abdominal:     General: Bowel sounds are normal. There is no distension.     Palpations: Abdomen is soft. There is no mass.     Tenderness: There is no abdominal tenderness.  Musculoskeletal:        General: Normal range of motion.     Right lower leg: No edema.      Left lower leg: No edema.  Skin:    Comments: Hyperpigmented scaly rash on base of neck, flexor aspect of both arms, anterior trunk  Neurological:     Mental Status: She is alert and oriented to person, place, and time.  Psychiatric:        Mood and Affect: Mood normal.     CMP Latest Ref Rng & Units 05/15/2018 03/18/2018 01/11/2012  Glucose 70 - 99 mg/dL 104(H) 99 89  BUN 6 - 20 mg/dL 8 11 8   Creatinine 0.44 - 1.00 mg/dL 1.15(H) 0.84 0.90  Sodium 135 - 145 mmol/L 138 141 140  Potassium 3.5 - 5.1 mmol/L 3.3(L) 3.8 3.8  Chloride 98 - 111 mmol/L 109 109 107  CO2 22 - 32 mmol/L 21(L) 26 27  Calcium 8.9 - 10.3 mg/dL 9.0 8.9 9.1  Total Protein 6.5 - 8.1 g/dL 6.8 7.9 -  Total Bilirubin 0.3 - 1.2 mg/dL 0.5 0.6 -  Alkaline Phos 38 - 126 U/L 48 54 -  AST 15 - 41 U/L 17 19 -  ALT 0 - 44 U/L 10 14 -    Lipid Panel  No results found for: CHOL, TRIG, HDL, CHOLHDL, VLDL, LDLCALC, LDLDIRECT  CBC    Component Value Date/Time   WBC 4.3 05/15/2018 1257   RBC 3.81 (L) 05/15/2018 1257   HGB 11.9 (L) 05/15/2018 1257   HGB 10.8 07/01/2014 0000   HCT 35.7 (L) 05/15/2018 1257   HCT 31 07/01/2014 0000   PLT 184 05/15/2018 1257   PLT 201 07/01/2014 0000   MCV 93.7 05/15/2018 1257   MCH 31.2 05/15/2018 1257   MCHC 33.3 05/15/2018 1257   RDW 12.0 05/15/2018 1257   LYMPHSABS 0.5 (L) 05/15/2018 1257   MONOABS 0.4 05/15/2018 1257   EOSABS 0.1 05/15/2018 1257   BASOSABS 0.0 05/15/2018 1257    No results found for: HGBA1C  Assessment & Plan:  1. Other eczema Uncontrolled Advised to pick up triamcinolone prescription which was previously sent to her pharmacy Discussed skin care with use of fragrance free skin products, limiting showers and avoiding hot baths If symptoms persist at next visit we will try oral steroids - hydrOXYzine (ATARAX/VISTARIL) 25 MG tablet; Take 1 tablet (25 mg total) by mouth at bedtime as needed.  Dispense: 30 tablet; Refill: 1  2. Need for immunization against  influenza - Flu Vaccine QUAD 36+ mos IM      Charlott Rakes, MD, FAAFP. [facility] and [place], [place]   06/19/2019, 4:20 PM

## 2019-06-20 ENCOUNTER — Encounter: Payer: Self-pay | Admitting: Family Medicine

## 2019-08-20 ENCOUNTER — Telehealth: Payer: Self-pay | Admitting: Family Medicine

## 2019-08-20 MED ORDER — ALBUTEROL SULFATE HFA 108 (90 BASE) MCG/ACT IN AERS: 2.0000 | INHALATION_SPRAY | RESPIRATORY_TRACT | 2 refills | Status: DC | PRN

## 2019-08-20 NOTE — Telephone Encounter (Signed)
Refill sent.

## 2019-08-20 NOTE — Telephone Encounter (Signed)
Hey luke can you take a look at this

## 2019-08-20 NOTE — Telephone Encounter (Signed)
1) Medication(s) Requested (by name):albuterol (PROVENTIL HFA;VENTOLIN HFA) 108 (90 Base) MCG/ACT inhaler 2 puff  [id number]    2) Pharmacy of Choice:[facility] on [address]   3) Special Requests:   Approved medications will be sent to the pharmacy, we will reach out if there is an issue.  Requests made after 3pm may not be addressed until the following business day!  If a patient is unsure of the name of the medication(s) please note and ask patient to call back when they are able to provide all info, do not send to responsible party until all information is available!

## 2019-08-25 IMAGING — CR DG CHEST 2V
2 series · 2 of 2 positions shown · non-contrast
Comparison: 03/08/2016

CLINICAL DATA: Vomiting and headache since this morning.

EXAM:
CHEST - 2 VIEW

[w chest pa]
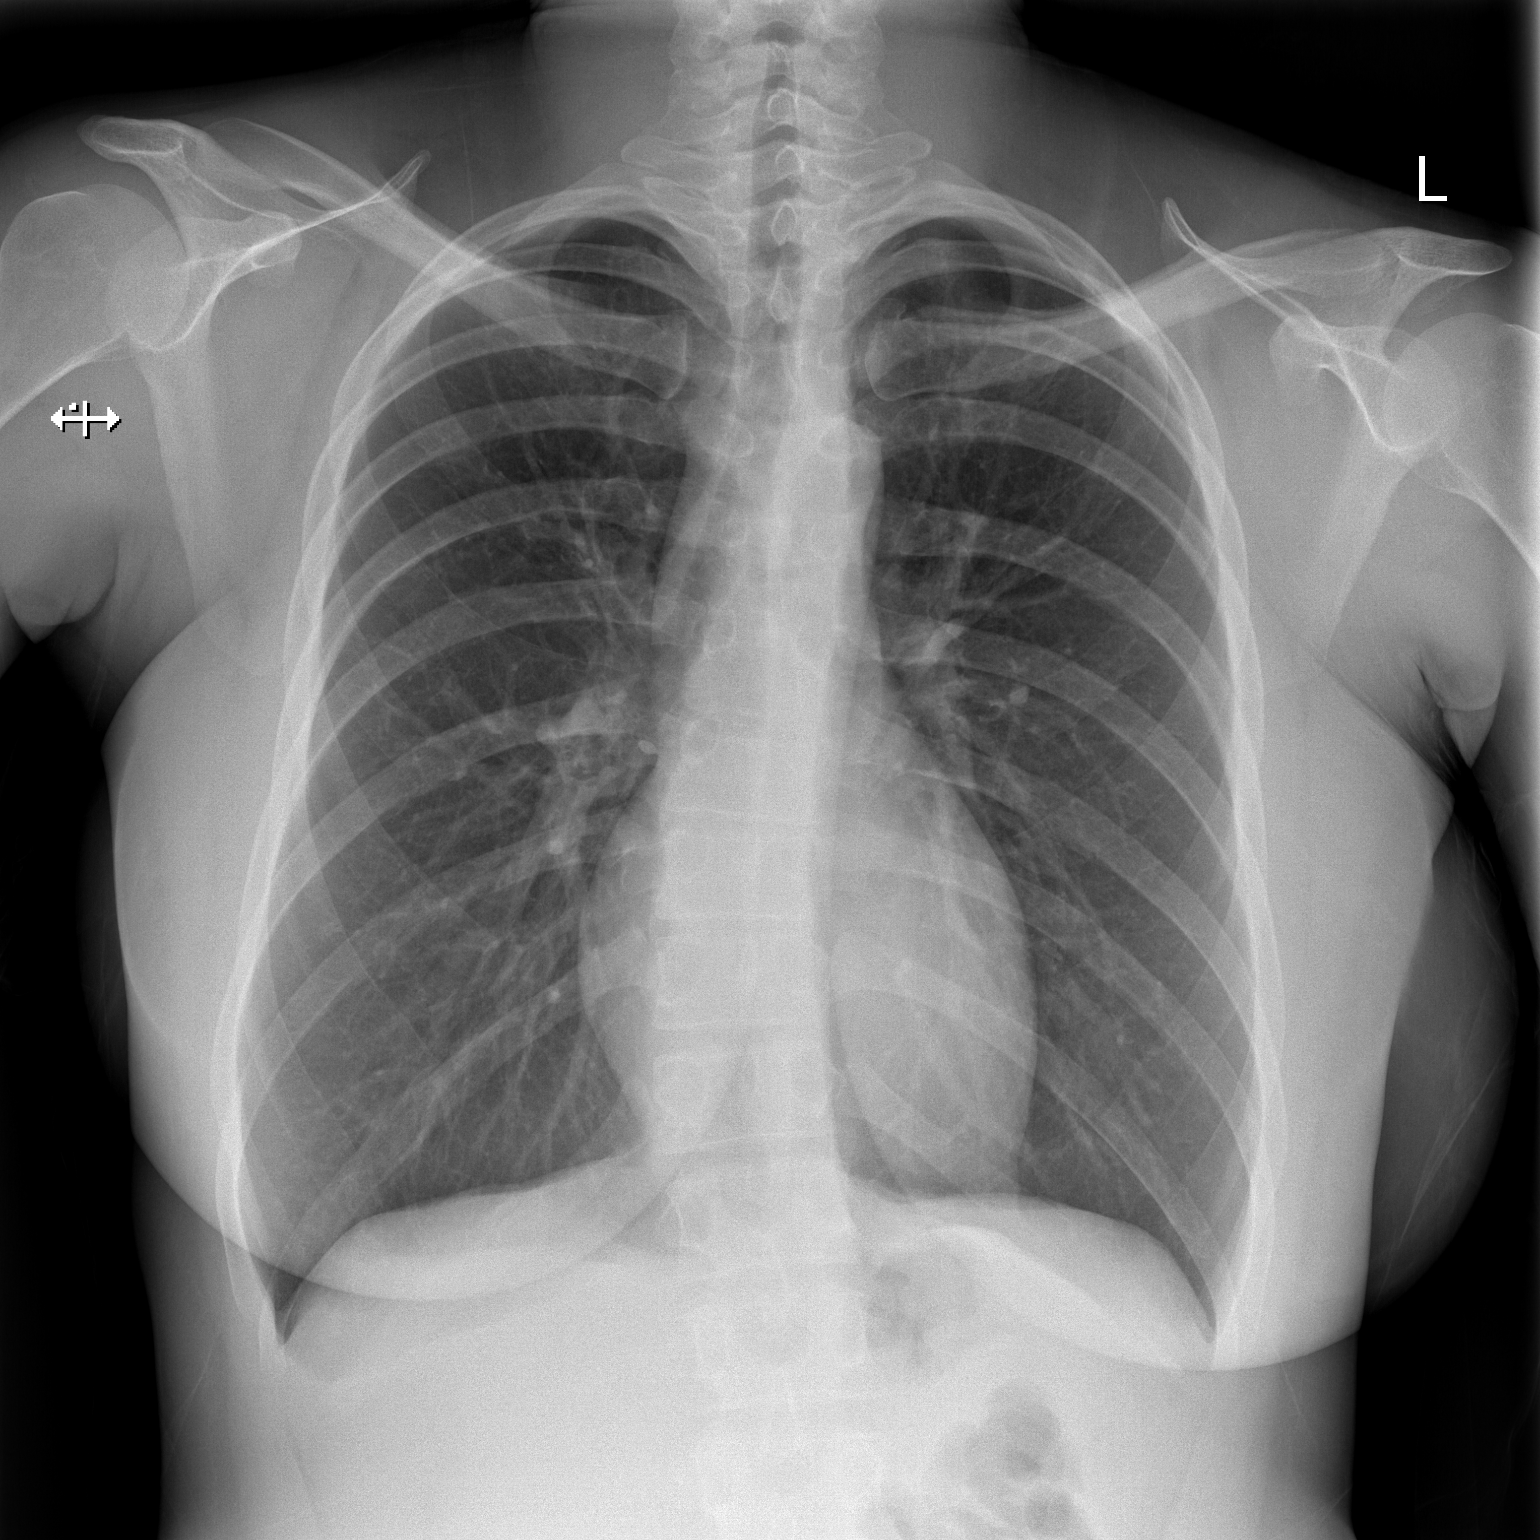

[w chest lat]
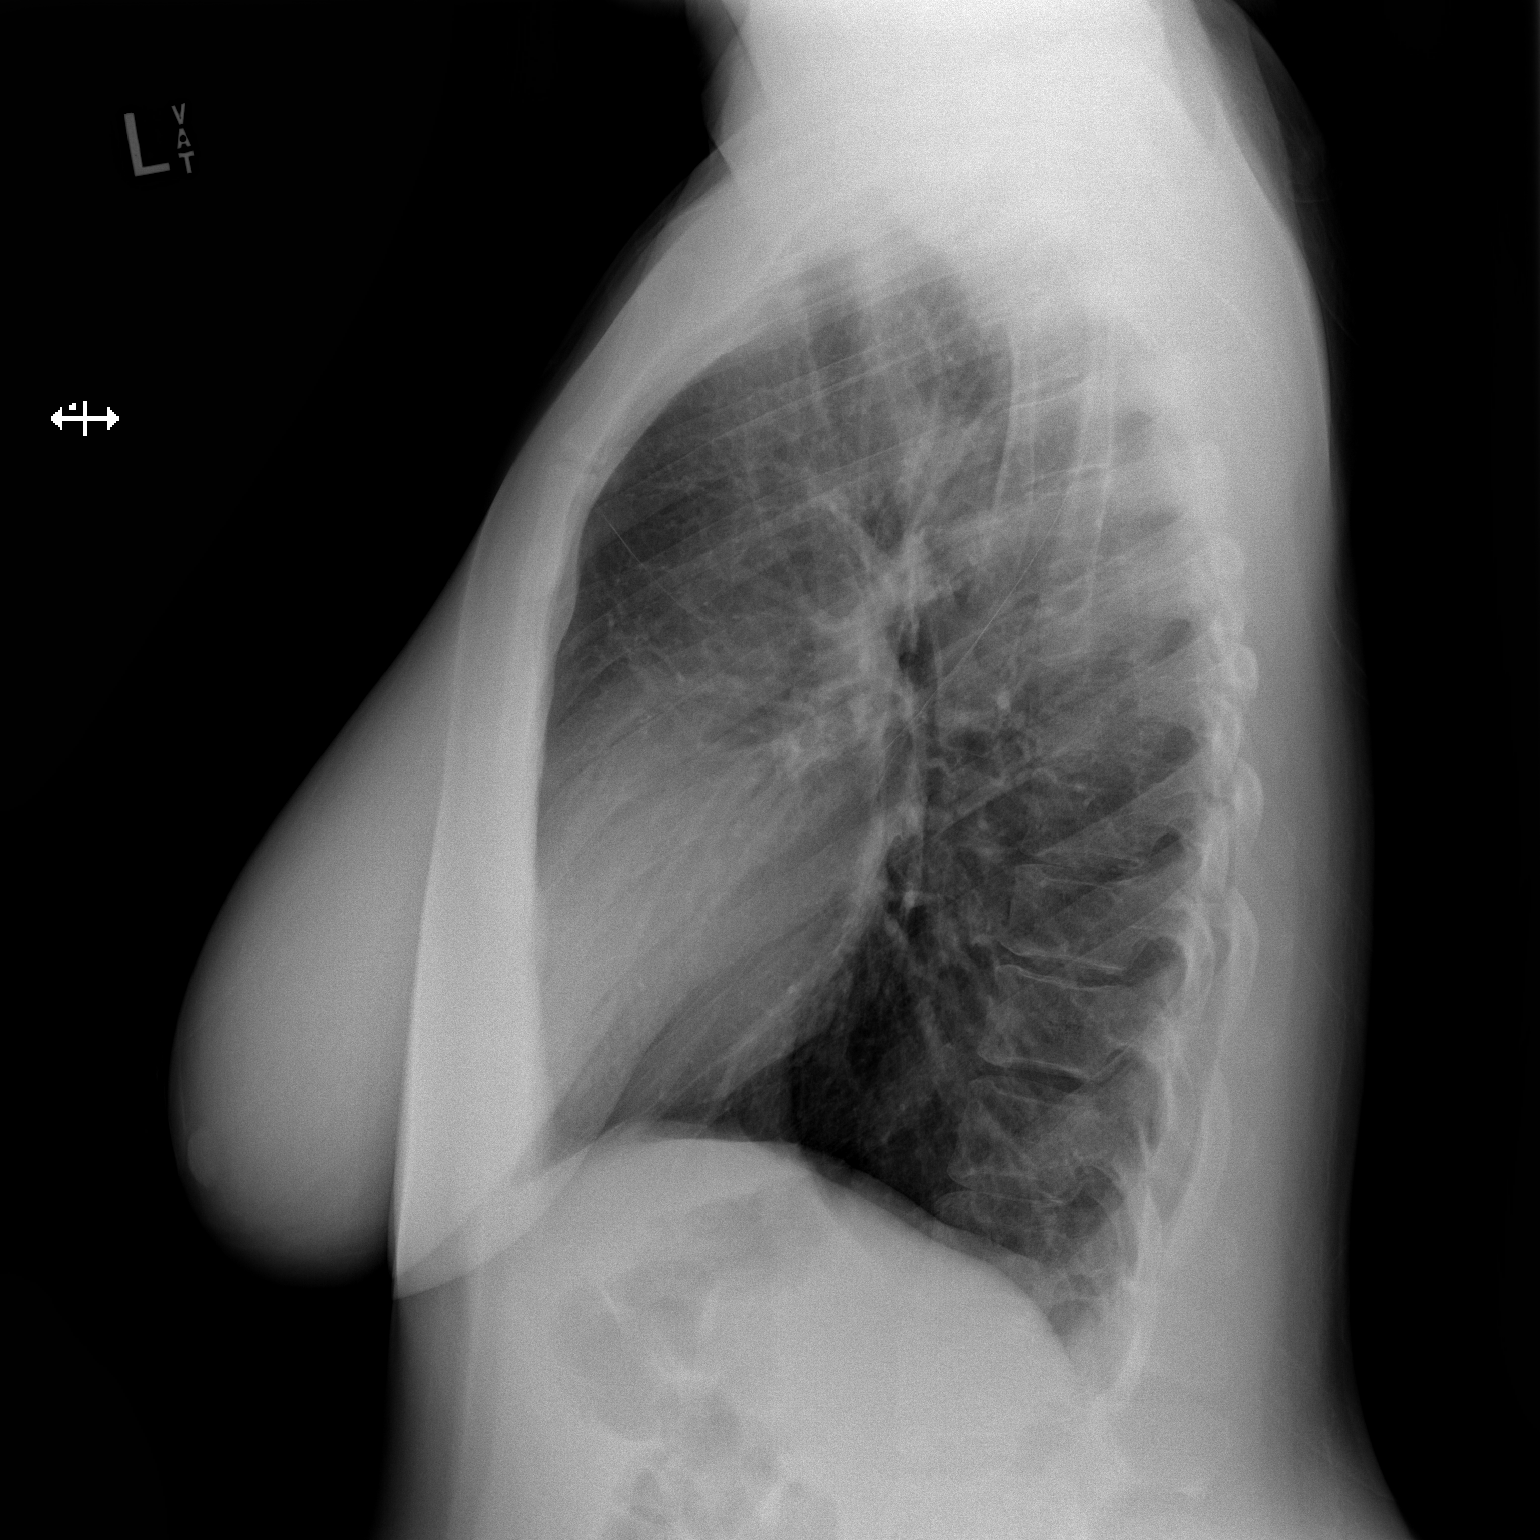

[2 of 2 positions shown; findings below may reference images not displayed]

FINDINGS: The heart size and mediastinal contours are within normal limits.
Both lungs are clear. The visualized skeletal structures are
unremarkable.
IMPRESSION: Normal chest x-ray.

## 2019-09-18 ENCOUNTER — Ambulatory Visit: Payer: Medicaid Other | Admitting: Family Medicine

## 2020-02-20 ENCOUNTER — Other Ambulatory Visit: Payer: Self-pay

## 2020-02-20 ENCOUNTER — Ambulatory Visit (INDEPENDENT_AMBULATORY_CARE_PROVIDER_SITE_OTHER): Payer: Self-pay | Admitting: Obstetrics and Gynecology

## 2020-02-20 ENCOUNTER — Encounter: Payer: Self-pay | Admitting: Obstetrics and Gynecology

## 2020-02-20 ENCOUNTER — Other Ambulatory Visit (HOSPITAL_COMMUNITY)
Admission: RE | Admit: 2020-02-20 | Discharge: 2020-02-20 | Disposition: A | Discharge status: Home / Self Care | Payer: Medicaid Other | Source: Ambulatory Visit | Attending: Obstetrics and Gynecology | Admitting: Obstetrics and Gynecology

## 2020-02-20 DIAGNOSIS — L739 Follicular disorder, unspecified: Secondary | ICD-10-CM | POA: Insufficient documentation

## 2020-02-20 DIAGNOSIS — N898 Other specified noninflammatory disorders of vagina: Secondary | ICD-10-CM | POA: Insufficient documentation

## 2020-02-20 DIAGNOSIS — R102 Pelvic and perineal pain: Secondary | ICD-10-CM | POA: Insufficient documentation

## 2020-02-20 MED ORDER — CEPHALEXIN 500 MG PO CAPS: 500.0000 mg | ORAL_CAPSULE | Freq: Three times a day (TID) | ORAL | 0 refills | Status: DC

## 2020-02-20 NOTE — Progress Notes (Signed)
Ms Kowaleski presents with several complaints She has noted a vaginal discharge for several weeks now. Not sexual active. No contraception  She also reports some " painful vaginal bumps" after shaving. She has not shaved in several weeks but the "bumps" have not resolved and are painful.  She also reports some off/on pelvic pain. She has a H/O Ex Lap with bilateral ovarian cystectomy in 2008 d/t dermoid cysts  She is due a pap smear as well.  PE AF VSS Lungs clear Heart RRR Abd soft + BS GU area of folliculitis with cyst formation on perineum, yellow vaginal discharge, cervix no lesions,uterus small non tender, min right adnexal tenderness, no masses  A/P Pelvic pain        Vaginal Discharge        Folliculitis Will check vaginal swab. Warm soaks and Keflex for folliculitis. Advised to stop shaving. Will also check GYN U/S. Return in 1 month to discuss U/S results, contraception and yearly GYN exam.

## 2020-02-20 NOTE — Progress Notes (Signed)
[place] appointment scheduled for 02/27/20 @ 11 am.

## 2020-02-21 LAB — CERVICOVAGINAL ANCILLARY ONLY
Bacterial Vaginitis (gardnerella): POSITIVE — AB
Candida Glabrata: NEGATIVE
Candida Vaginitis: POSITIVE — AB
Chlamydia: NEGATIVE
Comment: NEGATIVE
Comment: NEGATIVE
Comment: NEGATIVE
Comment: NEGATIVE
Comment: NEGATIVE
Comment: NORMAL
Neisseria Gonorrhea: NEGATIVE
Trichomonas: NEGATIVE

## 2020-02-24 ENCOUNTER — Other Ambulatory Visit: Payer: Self-pay | Admitting: *Deleted

## 2020-02-24 DIAGNOSIS — B379 Candidiasis, unspecified: Secondary | ICD-10-CM

## 2020-02-24 DIAGNOSIS — N760 Acute vaginitis: Secondary | ICD-10-CM

## 2020-02-24 MED ORDER — METRONIDAZOLE 500 MG PO TABS: 500.0000 mg | ORAL_TABLET | Freq: Two times a day (BID) | ORAL | 0 refills | Status: DC

## 2020-02-24 MED ORDER — FLUCONAZOLE 150 MG PO TABS: 150.0000 mg | ORAL_TABLET | Freq: Once | ORAL | 1 refills | Status: AC

## 2020-02-27 ENCOUNTER — Other Ambulatory Visit: Payer: Self-pay

## 2020-02-27 ENCOUNTER — Ambulatory Visit: Admission: RE | Admit: 2020-02-27 | Payer: Medicaid Other | Source: Ambulatory Visit

## 2020-02-27 ENCOUNTER — Emergency Department (HOSPITAL_COMMUNITY)
Admission: EM | Admit: 2020-02-27 | Discharge: 2020-02-27 | Disposition: A | Discharge status: Home / Self Care | Payer: Medicaid Other | Attending: Emergency Medicine | Admitting: Emergency Medicine

## 2020-02-27 DIAGNOSIS — R112 Nausea with vomiting, unspecified: Secondary | ICD-10-CM | POA: Insufficient documentation

## 2020-02-27 DIAGNOSIS — J45909 Unspecified asthma, uncomplicated: Secondary | ICD-10-CM | POA: Insufficient documentation

## 2020-02-27 DIAGNOSIS — Z91010 Allergy to peanuts: Secondary | ICD-10-CM | POA: Insufficient documentation

## 2020-02-27 DIAGNOSIS — Z87891 Personal history of nicotine dependence: Secondary | ICD-10-CM | POA: Insufficient documentation

## 2020-02-27 DIAGNOSIS — R10816 Epigastric abdominal tenderness: Secondary | ICD-10-CM | POA: Insufficient documentation

## 2020-02-27 LAB — CBC WITH DIFFERENTIAL/PLATELET
Abs Immature Granulocytes: 0.01 10*3/uL (ref 0.00–0.07)
Basophils Absolute: 0 10*3/uL (ref 0.0–0.1)
Basophils Relative: 0 %
Eosinophils Absolute: 0 10*3/uL (ref 0.0–0.5)
Eosinophils Relative: 1 %
HCT: 33.1 % — ABNORMAL LOW (ref 36.0–46.0)
Hemoglobin: 11.7 g/dL — ABNORMAL LOW (ref 12.0–15.0)
Immature Granulocytes: 0 %
Lymphocytes Relative: 13 %
Lymphs Abs: 0.6 10*3/uL — ABNORMAL LOW (ref 0.7–4.0)
MCH: 32.5 pg (ref 26.0–34.0)
MCHC: 35.3 g/dL (ref 30.0–36.0)
MCV: 91.9 fL (ref 80.0–100.0)
Monocytes Absolute: 0.1 10*3/uL (ref 0.1–1.0)
Monocytes Relative: 3 %
Neutro Abs: 3.8 10*3/uL (ref 1.7–7.7)
Neutrophils Relative %: 83 %
Platelets: 159 10*3/uL (ref 150–400)
RBC: 3.6 MIL/uL — ABNORMAL LOW (ref 3.87–5.11)
RDW: 12 % (ref 11.5–15.5)
WBC: 4.6 10*3/uL (ref 4.0–10.5)
nRBC: 0 % (ref 0.0–0.2)

## 2020-02-27 LAB — COMPREHENSIVE METABOLIC PANEL
ALT: 11 U/L (ref 0–44)
AST: 17 U/L (ref 15–41)
Albumin: 3.7 g/dL (ref 3.5–5.0)
Alkaline Phosphatase: 49 U/L (ref 38–126)
Anion gap: 9 (ref 5–15)
BUN: 9 mg/dL (ref 6–20)
CO2: 19 mmol/L — ABNORMAL LOW (ref 22–32)
Calcium: 8.8 mg/dL — ABNORMAL LOW (ref 8.9–10.3)
Chloride: 113 mmol/L — ABNORMAL HIGH (ref 98–111)
Creatinine, Ser: 0.76 mg/dL (ref 0.44–1.00)
GFR calc non Af Amer: >60 mL/min (ref >60)
Glucose, Bld: 89 mg/dL (ref 70–99)
Potassium: 3.9 mmol/L (ref 3.5–5.1)
Sodium: 141 mmol/L (ref 135–145)
Total Bilirubin: 0.6 mg/dL (ref 0.3–1.2)
Total Protein: 6.8 g/dL (ref 6.5–8.1)

## 2020-02-27 LAB — I-STAT BETA HCG BLOOD, ED (MC, WL, AP ONLY): I-stat hCG, quantitative: <5 m[IU]/mL (ref <5)

## 2020-02-27 LAB — LIPASE, BLOOD: Lipase: 24 U/L (ref 11–51)

## 2020-02-27 LAB — ETHANOL: Alcohol, Ethyl (B): <10 mg/dL (ref <10)

## 2020-02-27 MED ORDER — ONDANSETRON 4 MG PO TBDP: ORAL_TABLET | ORAL | 0 refills | Status: DC

## 2020-02-27 MED ORDER — ONDANSETRON HCL 4 MG/2ML IJ SOLN
4.0000 mg | Freq: Once | INTRAMUSCULAR | Status: AC
  Administered 2020-02-27: 4 mg via INTRAVENOUS
  Filled 2020-02-27: qty 2

## 2020-02-27 MED ORDER — ACETAMINOPHEN 325 MG PO TABS
650.0000 mg | ORAL_TABLET | Freq: Once | ORAL | Status: AC
  Administered 2020-02-27: 650 mg via ORAL
  Filled 2020-02-27: qty 2

## 2020-02-27 MED ORDER — FAMOTIDINE 20 MG PO TABS: 20.0000 mg | ORAL_TABLET | Freq: Two times a day (BID) | ORAL | 0 refills | Status: DC | PRN

## 2020-02-27 MED ORDER — SODIUM CHLORIDE 0.9 % IV BOLUS
1000.0000 mL | Freq: Once | INTRAVENOUS | Status: AC
  Administered 2020-02-27: 1000 mL via INTRAVENOUS

## 2020-02-27 MED ORDER — PANTOPRAZOLE SODIUM 40 MG IV SOLR
40.0000 mg | Freq: Once | INTRAVENOUS | Status: AC
  Administered 2020-02-27: 40 mg via INTRAVENOUS
  Filled 2020-02-27: qty 40

## 2020-02-27 NOTE — ED Triage Notes (Signed)
Pt arrived via [facility] for nausea/vomiting. Pt reported nausea, vomiting, headache, chills, and abdominal cramping since 03:00 last night. Pt reported consuming one drink at a new bar around 01:00 before returning home and symptoms beginning. Patients heart rate was notably bradycardic during EMS transport. EMS obtained a 20g in the right AC and administered Zofran 4mg  and bolus fluids. Patient reports decreased nausea post zofran administration.

## 2020-02-27 NOTE — ED Notes (Signed)
Pt passed fluid challenge. No nausea or vomiting at this time.

## 2020-02-27 NOTE — ED Provider Notes (Signed)
[facility] EMERGENCY DEPARTMENT Provider Note   CSN: [id number] Arrival date & time: 02/27/20  1458     History Chief Complaint  Patient presents with  . Nausea    [facility] is a 34 y.o. female hx of asthma, here presenting with vomiting.  Patient states that she went out last night and had a drink and also ate some spicy food.  She states that she got home and subsequently started vomiting.  She also is complaining of some epigastric pain.  Patient was noted to be bradycardic with a heart rate in the 50s.  Patient was given Zofran prior to arrival.  The history is provided by the patient.       Past Medical History:  Diagnosis Date  . Asthma    uses inhaler as needed  . 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 Eczema     Patient Active Problem List   Diagnosis Date Noted  . Pelvic pain 02/20/2020  . Vaginal discharge 02/20/2020  . Folliculitis of perineum 02/20/2020    Past Surgical History:  Procedure Laterality Date  . OVARIAN CYST SURGERY  2008   ELAP, removal of bilateral dermoid cysts  . WISDOM TOOTH EXTRACTION       OB History    Gravida  1   Para  1   Term  1   Preterm  0   AB  0   Living  1     SAB  0   TAB  0   Ectopic  0   Multiple  0   Live Births  1           Family History  Problem Relation Age of Onset  . Diabetes Mother   . Hypertension Mother   . Heart disease Mother     Social History   Tobacco Use  . Smoking status: Former Smoker    Packs/day: 0.50    Types: Cigarettes    Quit date: 06/11/2017    Years since quitting: 2.7  . Smokeless tobacco: Never Used  Vaping Use  . Vaping Use: Never used  Substance Use Topics  . Alcohol use: No  . Drug use: Yes    Frequency: 5.0 times per week    Types: Marijuana    Home Medications Prior to Admission medications   Medication Sig Start Date End Date Taking? Authorizing Provider  albuterol (VENTOLIN HFA) 108 (90 Base) MCG/ACT inhaler Inhale  2 puffs into the lungs every 4 (four) hours as needed for wheezing or shortness of breath. 08/20/19  Yes Newlin, Charlane Ferretti, MD  EPINEPHrine (EPIPEN) 0.3 mg/0.3 mL DEVI Inject 0.3 mg into the muscle once as needed (for allergic reaction).    Yes [provider]  cephALEXin (KEFLEX) 500 MG capsule Take 1 capsule (500 mg total) by mouth 3 (three) times daily. 02/20/20   [name], MD  doxycycline (VIBRAMYCIN) 100 MG capsule Take 1 capsule (100 mg total) by mouth 2 (two) times daily. Patient not taking: Reported on 06/19/2019 05/24/19   Margarita Mail, PA-C  HYDROcodone-acetaminophen (NORCO) 5-325 MG tablet Take 1 tablet by mouth every 6 (six) hours as needed for severe pain. Patient not taking: Reported on 06/19/2019 05/24/19   Margarita Mail, PA-C  hydrocortisone 2.5 % lotion Apply topically 2 (two) times daily. Use on face. Patient not taking: Reported on 06/19/2019 05/13/19   Frederica Kuster, PA-C  hydrOXYzine (ATARAX/VISTARIL) 25 MG tablet Take 1 tablet (25 mg total)  by mouth at bedtime as needed. Patient not taking: Reported on 02/27/2020 06/19/19   Charlott Rakes, MD  Ichthammol 10 % OINT Apply a small amount to the affected area 2 times a day Patient not taking: Reported on 06/19/2019 05/24/19   Margarita Mail, PA-C  metroNIDAZOLE (FLAGYL) 500 MG tablet Take 1 tablet (500 mg total) by mouth 2 (two) times daily. 02/24/20   [name], MD  metroNIDAZOLE (METROGEL VAGINAL) 0.75 % vaginal gel Place 1 Applicatorful vaginally at bedtime. Patient not taking: Reported on 06/19/2019 05/08/19   Charlott Rakes, MD  triamcinolone cream (KENALOG) 0.1 % Apply 1 application topically 2 (two) times daily. Avoid face. Patient not taking: Reported on 02/27/2020 05/13/19   Frederica Kuster, PA-C    Allergies    Peanut-containing drug products and Peanuts [peanut oil]  Review of Systems   Review of Systems  Gastrointestinal: Positive for abdominal pain and vomiting.  All other systems reviewed and  are negative.   Physical Exam Updated Vital Signs BP (!) 137/94   Pulse 65   Temp 98.6 F (37 C) (Oral)   Resp 16   LMP 02/06/2020 (Approximate)   SpO2 100%   Physical Exam Vitals and nursing note reviewed.  Constitutional:      Appearance: Normal appearance.  HENT:     Head: Normocephalic.     Nose: Nose normal.     Mouth/Throat:     Mouth: Mucous membranes are dry.  Eyes:     Extraocular Movements: Extraocular movements intact.     Pupils: Pupils are equal, round, and reactive to light.  Cardiovascular:     Rate and Rhythm: Normal rate and regular rhythm.     Pulses: Normal pulses.     Heart sounds: Normal heart sounds.  Pulmonary:     Effort: Pulmonary effort is normal.     Breath sounds: Normal breath sounds.  Abdominal:     General: Abdomen is flat.     Comments: Mild epigastric tenderness   Musculoskeletal:        General: Normal range of motion.     Cervical back: Normal range of motion.  Skin:    General: Skin is warm.     Capillary Refill: Capillary refill takes less than 2 seconds.  Neurological:     General: No focal deficit present.     Mental Status: She is alert and oriented to person, place, and time.  Psychiatric:        Mood and Affect: Mood normal.        Behavior: Behavior normal.     ED Results / Procedures / Treatments   Labs (all labs ordered are listed, but only abnormal results are displayed) Labs Reviewed  CBC WITH DIFFERENTIAL/PLATELET - Abnormal; Notable for the following components:      Result Value   RBC 3.60 (*)    Hemoglobin 11.7 (*)    HCT 33.1 (*)    Lymphs Abs 0.6 (*)    All other components within normal limits  COMPREHENSIVE METABOLIC PANEL - Abnormal; Notable for the following components:   Chloride 113 (*)    CO2 19 (*)    Calcium 8.8 (*)    All other components within normal limits  LIPASE, BLOOD  ETHANOL  I-STAT BETA HCG BLOOD, ED ([facility], [facility], [facility] ONLY)    EKG EKG Interpretation  Date/Time:  Thursday February 27 2020 14:59:00 EDT Ventricular Rate:  52 PR Interval:    QRS Duration: 76 QT Interval:  460 QTC Calculation: 428 R Axis:   63 Text Interpretation: Sinus rhythm Atrial premature complex No significant change since last tracing Confirmed by Wandra Arthurs [phone]) on 02/27/2020 3:13:12 PM   Radiology No results found.  Procedures Procedures (including critical care time)  Medications Ordered in ED Medications  sodium chloride 0.9 % bolus 1,000 mL (1,000 mLs Intravenous New Bag/Given 02/27/20 1546)  ondansetron (ZOFRAN) injection 4 mg (4 mg Intravenous Given 02/27/20 1537)  pantoprazole (PROTONIX) injection 40 mg (40 mg Intravenous Given 02/27/20 1537)  acetaminophen (TYLENOL) tablet 650 mg (650 mg Oral Given 02/27/20 1737)    ED Course  I have reviewed the triage vital signs and the nursing notes.  Pertinent labs & imaging results that were available during my care of the patient were reviewed by me and considered in my medical decision making (see chart for details).    MDM Rules/Calculators/A&P                         [facility] is a 34 y.o. female here with abdominal pain.  Likely gastritis from alcohol and from eating spicy food. Plan to get CBC, CMP, lipase.  Will hydrate patient and give Zofran and reassess.   6:13 PM Labs unremarkable and alcohol level is negative.  Patient able to tolerate p.o. after Zofran.  Likely gastritis.  Will discharge home with Zofran and Pepcid as needed.   Final Clinical Impression(s) / ED Diagnoses Final diagnoses:  None    Rx / DC Orders ED Discharge Orders    None       Drenda Freeze, MD 02/27/20 [phone]

## 2020-02-27 NOTE — Discharge Instructions (Signed)
You likely have gastritis from spicy food and drinking alcohol.  Take Zofran for nausea.  Stay hydrated.  Take Pepcid as needed for gastritis.  Avoid spicy food or alcohol  See your doctor for follow-up return to ER if you have worse abdominal pain, vomiting, dehydration.

## 2020-02-27 NOTE — ED Notes (Signed)
Patient verbalizes understanding of discharge instructions. Prescriptions reviewed. Opportunity for questioning and answers were provided. Armband removed by staff, pt discharged from ED ambulatory waiting for a cab.

## 2020-02-27 NOTE — ED Notes (Signed)
Spoke with case manager about getting pt a cab voucher. Case Manager coming to ED.

## 2020-03-11 NOTE — Telephone Encounter (Signed)
error 

## 2020-03-23 ENCOUNTER — Ambulatory Visit: Payer: Medicaid Other | Admitting: Obstetrics and Gynecology

## 2020-04-01 ENCOUNTER — Other Ambulatory Visit: Payer: Self-pay | Admitting: Family Medicine

## 2020-04-01 MED ORDER — ALBUTEROL SULFATE HFA 108 (90 BASE) MCG/ACT IN AERS: 2.0000 | INHALATION_SPRAY | RESPIRATORY_TRACT | 0 refills | Status: DC | PRN

## 2020-04-01 NOTE — Telephone Encounter (Signed)
Copied from [facility] [id number]. Topic: Quick Communication - Rx Refill/Question >> Apr 01, 2020  2:39 PM Mcneil, Ja-Kwan wrote: Medication: albuterol (VENTOLIN HFA) 108 (90 Base) MCG/ACT inhaler  Has the patient contacted their pharmacy? no  Preferred Pharmacy (with phone number or street name): [facility] [place] [place] - [address] Phone: [phone]   Fax: [phone]  Agent: Please be advised that RX refills may take up to 3 business days. We ask that you follow-up with your pharmacy.

## 2020-05-07 DIAGNOSIS — Z23 Encounter for immunization: Secondary | ICD-10-CM | POA: Diagnosis not present

## 2020-06-04 DIAGNOSIS — Z1152 Encounter for screening for COVID-19: Secondary | ICD-10-CM | POA: Diagnosis not present

## 2020-06-24 ENCOUNTER — Telehealth: Payer: Self-pay | Admitting: Obstetrics and Gynecology

## 2020-06-24 NOTE — Telephone Encounter (Signed)
Patient want a new order for a [place]

## 2020-06-25 ENCOUNTER — Encounter: Payer: Self-pay | Admitting: Obstetrics and Gynecology

## 2020-06-25 ENCOUNTER — Other Ambulatory Visit: Payer: Self-pay

## 2020-06-25 ENCOUNTER — Ambulatory Visit (INDEPENDENT_AMBULATORY_CARE_PROVIDER_SITE_OTHER): Payer: Self-pay | Admitting: Obstetrics and Gynecology

## 2020-06-25 VITALS — BP 127/97 | HR 88 | Ht 66.0 in | Wt 161.0 lb

## 2020-06-25 DIAGNOSIS — R102 Pelvic and perineal pain: Secondary | ICD-10-CM

## 2020-06-25 NOTE — Progress Notes (Signed)
[facility] presents with continued c/o pelvic pain. Seen 9/21 for the same. See that note U/S was ordered by pt did not have LMP current  PE AF VSS Lungs clear Heart RRR Abd soft + BS GYN Deferred  A/P Pelvic Pain  Will reschedule GYN U/S. F/U in 3-4 weeks for results and pap smear

## 2020-06-30 ENCOUNTER — Other Ambulatory Visit: Payer: Self-pay | Admitting: Family Medicine

## 2020-06-30 NOTE — Telephone Encounter (Signed)
Returned patients call. Informed patient of previously scheduled upcoming appointments for [place] and follow up with Dr. Rip Harbour. Patient voiced understanding.

## 2020-07-02 ENCOUNTER — Ambulatory Visit (HOSPITAL_COMMUNITY)
Admission: RE | Admit: 2020-07-02 | Discharge: 2020-07-02 | Disposition: A | Discharge status: Home / Self Care | Payer: Medicaid Other | Source: Ambulatory Visit | Attending: Obstetrics and Gynecology | Admitting: Obstetrics and Gynecology

## 2020-07-02 ENCOUNTER — Other Ambulatory Visit: Payer: Self-pay

## 2020-07-02 DIAGNOSIS — R102 Pelvic and perineal pain: Secondary | ICD-10-CM | POA: Diagnosis not present

## 2020-07-15 ENCOUNTER — Encounter (HOSPITAL_COMMUNITY): Payer: Self-pay | Admitting: Emergency Medicine

## 2020-07-15 ENCOUNTER — Emergency Department (HOSPITAL_COMMUNITY)
Admission: EM | Admit: 2020-07-15 | Discharge: 2020-07-15 | Disposition: A | Discharge status: Home / Self Care | Payer: Medicaid Other | Attending: Emergency Medicine | Admitting: Emergency Medicine

## 2020-07-15 DIAGNOSIS — R0600 Dyspnea, unspecified: Secondary | ICD-10-CM | POA: Diagnosis not present

## 2020-07-15 DIAGNOSIS — J45909 Unspecified asthma, uncomplicated: Secondary | ICD-10-CM | POA: Insufficient documentation

## 2020-07-15 DIAGNOSIS — Z87891 Personal history of nicotine dependence: Secondary | ICD-10-CM | POA: Insufficient documentation

## 2020-07-15 DIAGNOSIS — Z91010 Allergy to peanuts: Secondary | ICD-10-CM | POA: Insufficient documentation

## 2020-07-15 DIAGNOSIS — T7840XA Allergy, unspecified, initial encounter: Secondary | ICD-10-CM

## 2020-07-15 DIAGNOSIS — R21 Rash and other nonspecific skin eruption: Secondary | ICD-10-CM | POA: Diagnosis not present

## 2020-07-15 MED ORDER — DIPHENHYDRAMINE HCL 50 MG/ML IJ SOLN
50.0000 mg | Freq: Once | INTRAMUSCULAR | Status: AC
  Administered 2020-07-15: 50 mg via INTRAVENOUS
  Filled 2020-07-15: qty 1

## 2020-07-15 MED ORDER — FAMOTIDINE IN NACL 20-0.9 MG/50ML-% IV SOLN
20.0000 mg | Freq: Once | INTRAVENOUS | Status: AC
  Administered 2020-07-15: 20 mg via INTRAVENOUS
  Filled 2020-07-15: qty 50

## 2020-07-15 MED ORDER — PREDNISONE 50 MG PO TABS: ORAL_TABLET | ORAL | 0 refills | Status: DC

## 2020-07-15 MED ORDER — METHYLPREDNISOLONE SODIUM SUCC 125 MG IJ SOLR
125.0000 mg | Freq: Once | INTRAMUSCULAR | Status: AC
  Administered 2020-07-15: 125 mg via INTRAVENOUS
  Filled 2020-07-15: qty 2

## 2020-07-15 NOTE — ED Triage Notes (Signed)
Patient complains of having an eczema exacerbation or allergic reaction that started yesterday. Patient states her skin is painful to touch on her chest, neck, face, and back, eyes and back are swollen. Patient alert, oriented, and in no apparent distress at this time.

## 2020-07-15 NOTE — ED Provider Notes (Signed)
[facility] EMERGENCY DEPARTMENT Provider Note   CSN: [id number] Arrival date & time: 07/15/20  1033     History Chief Complaint  Patient presents with  . Allergic Reaction    [facility]ana Young is a 35 y.o. female.  35 year old female presents with worsening rash to her face as well as trouble breathing.  Has a history of asthma as well as eczema.  States that she has multiple allergies and that her she is to watch in a different detergent and she thinks that this could be the etiology of this.  Denies any fever or chills.  No trouble swallowing.  Symptoms have been progressively worse no treatment use prior to arrival.  Nothing makes them better.        Past Medical History:  Diagnosis Date  . Asthma    uses inhaler as needed  . 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 Eczema     Patient Active Problem List   Diagnosis Date Noted  . Pelvic pain 02/20/2020  . Vaginal discharge 02/20/2020  . Folliculitis of perineum 02/20/2020    Past Surgical History:  Procedure Laterality Date  . OVARIAN CYST SURGERY  2008   ELAP, removal of bilateral dermoid cysts  . WISDOM TOOTH EXTRACTION       OB History    Gravida  1   Para  1   Term  1   Preterm  0   AB  0   Living  1     SAB  0   IAB  0   Ectopic  0   Multiple  0   Live Births  1           Family History  Problem Relation Age of Onset  . Diabetes Mother   . Hypertension Mother   . Heart disease Mother     Social History   Tobacco Use  . Smoking status: Former Smoker    Packs/day: 0.50    Types: Cigarettes    Quit date: 06/11/2017    Years since quitting: 3.0  . Smokeless tobacco: Never Used  Vaping Use  . Vaping Use: Never used  Substance Use Topics  . Alcohol use: No  . Drug use: Yes    Frequency: 5.0 times per week    Types: Marijuana    Home Medications Prior to Admission medications   Medication Sig Start Date End Date Taking? Authorizing Provider   albuterol (VENTOLIN HFA) 108 (90 Base) MCG/ACT inhaler Inhale 2 puffs into the lungs every 4 (four) hours as needed for wheezing or shortness of breath. 04/01/20   Charlott Rakes, MD  cephALEXin (KEFLEX) 500 MG capsule Take 1 capsule (500 mg total) by mouth 3 (three) times daily. Patient not taking: Reported on 06/25/2020 02/20/20   [name], MD  doxycycline (VIBRAMYCIN) 100 MG capsule Take 1 capsule (100 mg total) by mouth 2 (two) times daily. Patient not taking: Reported on 06/19/2019 05/24/19   Margarita Mail, PA-C  EPINEPHrine 0.3 mg/0.3 mL IJ SOAJ injection Inject 0.3 mg into the muscle once as needed (for allergic reaction).     [provider]  famotidine (PEPCID) 20 MG tablet Take 1 tablet (20 mg total) by mouth 2 (two) times daily as needed for heartburn or indigestion. 02/27/20   Drenda Freeze, MD  HYDROcodone-acetaminophen (NORCO) 5-325 MG tablet Take 1 tablet by mouth every 6 (six) hours as needed for severe pain. Patient not taking: Reported on 06/19/2019  05/24/19   Margarita Mail, PA-C  hydrocortisone 2.5 % lotion Apply topically 2 (two) times daily. Use on face. Patient not taking: Reported on 06/19/2019 05/13/19   Frederica Kuster, PA-C  hydrOXYzine (ATARAX/VISTARIL) 25 MG tablet Take 1 tablet (25 mg total) by mouth at bedtime as needed. Patient not taking: Reported on 02/27/2020 06/19/19   Charlott Rakes, MD  Ichthammol 10 % OINT Apply a small amount to the affected area 2 times a day Patient not taking: Reported on 06/19/2019 05/24/19   Margarita Mail, PA-C  metroNIDAZOLE (FLAGYL) 500 MG tablet Take 1 tablet (500 mg total) by mouth 2 (two) times daily. Patient not taking: Reported on 06/25/2020 02/24/20   [name], MD  metroNIDAZOLE (METROGEL VAGINAL) 0.75 % vaginal gel Place 1 Applicatorful vaginally at bedtime. Patient not taking: Reported on 06/19/2019 05/08/19   Charlott Rakes, MD  ondansetron (ZOFRAN ODT) 4 MG disintegrating tablet 4mg  ODT q4 hours prn  nausea/vomit Patient not taking: Reported on 06/25/2020 02/27/20   Drenda Freeze, MD  triamcinolone cream (KENALOG) 0.1 % Apply 1 application topically 2 (two) times daily. Avoid face. Patient not taking: Reported on 02/27/2020 05/13/19   Frederica Kuster, PA-C    Allergies    Peanut-containing drug products and Peanuts [peanut oil]  Review of Systems   Review of Systems  All other systems reviewed and are negative.   Physical Exam Updated Vital Signs BP (!) 127/104 (BP Location: Right Arm)   Pulse (!) 104   Temp 99.1 F (37.3 C)   Resp (!) 22   SpO2 100%   Physical Exam Vitals and nursing note reviewed.  Constitutional:      General: She is not in acute distress.    Appearance: Normal appearance. She is well-developed and well-nourished. She is not toxic-appearing.  HENT:     Head: Normocephalic and atraumatic.  Eyes:     General: Lids are normal.     Extraocular Movements: EOM normal.     Conjunctiva/sclera: Conjunctivae normal.     Pupils: Pupils are equal, round, and reactive to light.  Neck:     Thyroid: No thyroid mass.     Trachea: No tracheal deviation.  Cardiovascular:     Rate and Rhythm: Normal rate and regular rhythm.     Heart sounds: Normal heart sounds. No murmur heard. No gallop.   Pulmonary:     Effort: Pulmonary effort is normal. No respiratory distress.     Breath sounds: Normal breath sounds. No stridor. No decreased breath sounds, wheezing, rhonchi or rales.  Abdominal:     General: Bowel sounds are normal. There is no distension.     Palpations: Abdomen is soft.     Tenderness: There is no abdominal tenderness. There is no CVA tenderness or rebound.  Musculoskeletal:        General: No tenderness or edema. Normal range of motion.     Cervical back: Normal range of motion and neck supple.  Skin:    General: Skin is warm and dry.     Findings: Rash present. Rash is macular, papular and urticarial. Rash is not purpuric or pustular.   Neurological:     Mental Status: She is alert and oriented to person, place, and time.     GCS: GCS eye subscore is 4. GCS verbal subscore is 5. GCS motor subscore is 6.     Cranial Nerves: No cranial nerve deficit.     Sensory: No sensory deficit.  Deep Tendon Reflexes: Strength normal.  Psychiatric:        Mood and Affect: Mood and affect normal.        Speech: Speech normal.        Behavior: Behavior normal.     ED Results / Procedures / Treatments   Labs (all labs ordered are listed, but only abnormal results are displayed) Labs Reviewed - No data to display  EKG EKG Interpretation  Date/Time:  Wednesday July 15 2020 10:41:11 EST Ventricular Rate:  96 PR Interval:  118 QRS Duration: 70 QT Interval:  340 QTC Calculation: 429 R Axis:   74 Text Interpretation: Normal sinus rhythm Normal ECG No old tracing to compare Confirmed by Lacretia Leigh ([id number]) on 07/15/2020 11:07:28 AM   Radiology No results found.  Procedures Procedures   Medications Ordered in ED Medications  diphenhydrAMINE (BENADRYL) injection 50 mg (has no administration in time range)  methylPREDNISolone sodium succinate (SOLU-MEDROL) 125 mg/2 mL injection 125 mg (has no administration in time range)  famotidine (PEPCID) IVPB 20 mg premix (has no administration in time range)    ED Course  I have reviewed the triage vital signs and the nursing notes.  Pertinent labs & imaging results that were available during my care of the patient were reviewed by me and considered in my medical decision making (see chart for details).    MDM Rules/Calculators/A&P                          Pt given solumedrol, benadryl, and pepcid Rash greatly improved Suspect allergic reaction Will d/c on prednisone  CRITICAL CARE Performed by: Leota Jacobsen Total critical care time: 50 minutes Critical care time was exclusive of separately billable procedures and treating other patients. Critical care was  necessary to treat or prevent imminent or life-threatening deterioration. Critical care was time spent personally by me on the following activities: development of treatment plan with patient and/or surrogate as well as nursing, discussions with consultants, evaluation of patient's response to treatment, examination of patient, obtaining history from patient or surrogate, ordering and performing treatments and interventions, ordering and review of laboratory studies, ordering and review of radiographic studies, pulse oximetry and re-evaluation of patient's condition.  Final Clinical Impression(s) / ED Diagnoses Final diagnoses:  None    Rx / DC Orders ED Discharge Orders    None       Lacretia Leigh, MD 07/15/20 1310

## 2020-07-15 NOTE — ED Notes (Signed)
Patient Alert and oriented to baseline. Stable and ambulatory to baseline. Patient verbalized understanding of the discharge instructions.  Patient belongings were taken by the patient.   

## 2020-07-16 ENCOUNTER — Telehealth: Payer: Self-pay | Admitting: *Deleted

## 2020-07-16 NOTE — Telephone Encounter (Signed)
Transition Care Management Unsuccessful Follow-up Telephone Call  Date of discharge and from where:  07/15/2020 [facility]  Attempts:  1st Attempt  Reason for unsuccessful TCM follow-up call:  Left voice message

## 2020-07-17 NOTE — Telephone Encounter (Signed)
Transition Care Management Unsuccessful Follow-up Telephone Call  Date of discharge and from where:  07/15/2020 [facility]  Attempts:  2nd Attempt  Reason for unsuccessful TCM follow-up call:  Left voice message - call went straight to VM

## 2020-07-20 ENCOUNTER — Other Ambulatory Visit (HOSPITAL_COMMUNITY)
Admission: RE | Admit: 2020-07-20 | Discharge: 2020-07-20 | Disposition: A | Discharge status: Home / Self Care | Payer: Medicaid Other | Source: Ambulatory Visit | Attending: Obstetrics and Gynecology | Admitting: Obstetrics and Gynecology

## 2020-07-20 ENCOUNTER — Ambulatory Visit (INDEPENDENT_AMBULATORY_CARE_PROVIDER_SITE_OTHER): Payer: Medicaid Other | Admitting: Obstetrics and Gynecology

## 2020-07-20 ENCOUNTER — Other Ambulatory Visit: Payer: Self-pay

## 2020-07-20 ENCOUNTER — Encounter: Payer: Self-pay | Admitting: Obstetrics and Gynecology

## 2020-07-20 VITALS — BP 130/97 | HR 85 | Ht 66.0 in | Wt 164.4 lb

## 2020-07-20 DIAGNOSIS — N840 Polyp of corpus uteri: Secondary | ICD-10-CM | POA: Insufficient documentation

## 2020-07-20 DIAGNOSIS — Z01419 Encounter for gynecological examination (general) (routine) without abnormal findings: Secondary | ICD-10-CM | POA: Diagnosis not present

## 2020-07-20 DIAGNOSIS — D279 Benign neoplasm of unspecified ovary: Secondary | ICD-10-CM | POA: Insufficient documentation

## 2020-07-20 DIAGNOSIS — R102 Pelvic and perineal pain: Secondary | ICD-10-CM

## 2020-07-20 DIAGNOSIS — Z87891 Personal history of nicotine dependence: Secondary | ICD-10-CM | POA: Diagnosis not present

## 2020-07-20 DIAGNOSIS — D369 Benign neoplasm, unspecified site: Secondary | ICD-10-CM | POA: Diagnosis not present

## 2020-07-20 NOTE — Addendum Note (Signed)
Addended by: Annabell Howells on: 07/20/2020 04:39 PM   Modules accepted: Orders

## 2020-07-20 NOTE — Progress Notes (Signed)
Ariana Young is a 35 y.o. G43P1001 female here for a routine annual gynecologic exam and follow up results from GYN U/S for pelvic pain. U/S results reviewed with pt. Bilateral dermoid cysts noted and probable endometrial polyp.    Denies abnormal vaginal bleeding, discharge, problems with intercourse or other gynecologic concerns.    Gynecologic History Patient's last menstrual period was 07/18/2020 (exact date). Contraception: condoms Last Pap: 03/2014. Results were: normal Last mammogram: NA  Obstetric History OB History  Gravida Para Term Preterm AB Living  1 1 1  0 0 1  SAB IAB Ectopic Multiple Live Births  0 0 0 0 1    # Outcome Date GA Lbr Len/2nd Weight Sex Delivery Anes PTL Lv  1 Term 01/12/15 [redacted]w[redacted]d 12:26 / 00:30 7 lb 8.4 oz (3.413 kg) M Vag-Spont EPI  LIV     Birth Comments: Left hand skin tag    Past Medical History:  Diagnosis Date  . Asthma    uses inhaler as needed  . 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 Eczema     Past Surgical History:  Procedure Laterality Date  . OVARIAN CYST SURGERY  2008   ELAP, removal of bilateral dermoid cysts  . WISDOM TOOTH EXTRACTION      Current Outpatient Medications on File Prior to Visit  Medication Sig Dispense Refill  . albuterol (VENTOLIN HFA) 108 (90 Base) MCG/ACT inhaler Inhale 2 puffs into the lungs every 4 (four) hours as needed for wheezing or shortness of breath. 8.5 g 0  . EPINEPHrine 0.3 mg/0.3 mL IJ SOAJ injection Inject 0.3 mg into the muscle once as needed (for allergic reaction).     . predniSONE (DELTASONE) 50 MG tablet One po qd x 5 days 5 tablet 0  . famotidine (PEPCID) 20 MG tablet Take 1 tablet (20 mg total) by mouth 2 (two) times daily as needed for heartburn or indigestion. (Patient not taking: No sig reported) 20 tablet 0  . HYDROcodone-acetaminophen (NORCO) 5-325 MG tablet Take 1 tablet by mouth every 6 (six) hours as needed for severe pain. (Patient not taking: No sig reported) 6 tablet 0  .  hydrocortisone 2.5 % lotion Apply topically 2 (two) times daily. Use on face. (Patient not taking: No sig reported) 59 mL 0  . hydrOXYzine (ATARAX/VISTARIL) 25 MG tablet Take 1 tablet (25 mg total) by mouth at bedtime as needed. (Patient not taking: No sig reported) 30 tablet 1  . Ichthammol 10 % OINT Apply a small amount to the affected area 2 times a day (Patient not taking: No sig reported) 28.3 g 0  . metroNIDAZOLE (METROGEL VAGINAL) 0.75 % vaginal gel Place 1 Applicatorful vaginally at bedtime. (Patient not taking: No sig reported) 70 g 0  . ondansetron (ZOFRAN ODT) 4 MG disintegrating tablet 4mg  ODT q4 hours prn nausea/vomit (Patient not taking: No sig reported) 10 tablet 0  . triamcinolone cream (KENALOG) 0.1 % Apply 1 application topically 2 (two) times daily. Avoid face. 80 g 1   No current facility-administered medications on file prior to visit.    Allergies  Allergen Reactions  . Peanut-Containing Drug Products Anaphylaxis and Hives  . Peanuts [Peanut Oil] Anaphylaxis and Hives    Social History   Socioeconomic History  . Marital status: Single    Spouse name: Not on file  . Number of children: Not on file  . Years of education: Not on file  . Highest education level: Not on file  Occupational History  .  Not on file  Tobacco Use  . Smoking status: Former Smoker    Packs/day: 0.50    Types: Cigarettes    Quit date: 06/11/2017    Years since quitting: 3.1  . Smokeless tobacco: Never Used  Vaping Use  . Vaping Use: Never used  Substance and Sexual Activity  . Alcohol use: No  . Drug use: Yes    Frequency: 5.0 times per week    Types: Marijuana  . Sexual activity: Yes    Birth control/protection: None  Other Topics Concern  . Not on file  Social History Narrative  . Not on file   Social Determinants of Health   Financial Resource Strain: Not on file  Food Insecurity: No Food Insecurity  . Worried About Charity fundraiser in the Last Year: Never true  . Ran  Out of Food in the Last Year: Never true  Transportation Needs: No Transportation Needs  . Lack of Transportation (Medical): No  . Lack of Transportation (Non-Medical): No  Physical Activity: Not on file  Stress: Not on file  Social Connections: Not on file  Intimate Partner Violence: Not on file    Family History  Problem Relation Age of Onset  . Diabetes Mother   . Hypertension Mother   . Heart disease Mother     The following portions of the patient's history were reviewed and updated as appropriate: allergies, current medications, past family history, past medical history, past social history, past surgical history and problem list.  Review of Systems Pertinent items noted in HPI and remainder of comprehensive ROS otherwise negative.   Objective:  BP (!) 130/97 (BP Location: Left Arm)   Pulse 85   Ht 5\' 6"  (1.676 m)   Wt 164 lb 6.4 oz (74.6 kg)   LMP 07/18/2020 (Exact Date)   BMI 26.53 kg/m  CONSTITUTIONAL: Well-developed, well-nourished female in no acute distress.  HENT:  Normocephalic, atraumatic, External right and left ear normal. Oropharynx is clear and moist EYES: Conjunctivae and EOM are normal. Pupils are equal, round, and reactive to light. No scleral icterus.  NECK: Normal range of motion, supple, no masses.  Normal thyroid.  SKIN: Skin is warm and dry. No rash noted. Not diaphoretic. No erythema. No pallor. [place]: Alert and oriented to person, place, and time. Normal reflexes, muscle tone coordination. No cranial nerve deficit noted. PSYCHIATRIC: Normal mood and affect. Normal behavior. Normal judgment and thought content. CARDIOVASCULAR: Normal heart rate noted, regular rhythm RESPIRATORY: Clear to auscultation bilaterally. Effort and breath sounds normal, no problems with respiration noted. BREASTS: Symmetric in size. No masses, skin changes, nipple drainage, or lymphadenopathy. ABDOMEN: Soft, normal bowel sounds, no distention noted.  No tenderness,  rebound or guarding.  PELVIC: Normal appearing external genitalia; normal appearing vaginal mucosa and cervix.  No abnormal discharge noted.  Pap smear obtained.  Normal uterine size, right adnexal fullnes ss and mild tenderness, menes  MUSCULOSKELETAL: Normal range of motion. No tenderness.  No cyanosis, clubbing, or edema.  2+ distal pulses.   Assessment:  Annual gynecologic examination with pap smear Bilateral ovarian dermoid cysts Probable endometrial polyp Plan:  Will follow up results of pap smear and manage accordingly. Surgerical management of dermoid cysts and endometrial polyp reviewed with pt. Ex Lap with ovarian cystectomy and D & C for polyp discussed. Pt verbalized understanding and agree to POC. Information provided. Will schedule. F/U with post op appt Routine preventative health maintenance measures emphasized. Please refer to After Visit Summary for other  counseling recommendations.    [name], MD, [place] Attending [place] for [facility], [place]

## 2020-07-20 NOTE — Patient Instructions (Signed)
Ovarian Cystectomy, Care After This sheet gives you information about how to care for yourself after your procedure. Your health care provider may also give you more specific instructions. If you have problems or questions, contact your health care provider. What can I expect after the procedure? After the procedure, it is common to have:  Pain in the abdomen, especially at the incision areas. You will be given pain medicine to control the pain.  Tiredness. This is a normal part of the recovery process. Your energy level will return to normal over the next several weeks. Follow these instructions at home: Medicines  Take over-the-counter and prescription medicines only as told by your health care provider.  If you were prescribed an antibiotic medicine, use it as told by your health care provider. Do not stop using the antibiotic even if you start to feel better.  Do not take aspirin because it can cause bleeding.  Ask your health care provider if the medicine prescribed to you: ? Requires you to avoid driving or using heavy machinery. ? Can cause constipation. You may need to take these actions to prevent or treat constipation:  Drink enough fluid to keep your urine pale yellow.  Take over-the-counter or prescription medicines.  Eat foods that are high in fiber, such as beans, whole grains, and fresh fruits and vegetables.  Limit foods that are high in fat and processed sugars, such as fried or sweet foods. Incision care  Follow instructions from your health care provider about how to take care of your incisions. Make sure you: ? Wash your hands with soap and water for at least 20 seconds before and after you change your bandage (dressing). If soap and water are not available, use hand sanitizer. ? Change your dressing as told by your health care provider. ? Leave stitches (sutures), skin glue, or adhesive strips in place. These skin closures may need to stay in place for 2 weeks or  longer. If adhesive strip edges start to loosen and curl up, you may trim the loose edges. Do not remove adhesive strips completely unless your health care provider tells you to do that.  Check your incision areas every day for signs of infection. Check for: ? More redness, swelling, or pain. ? Fluid or blood. ? Warmth. ? Pus or a bad smell.  Do not take baths, swim, or use a hot tub until your health care provider approves. Ask your health care provider if you may take showers. You may only be allowed to take sponge baths.   Activity  Rest as told by your health care provider.  Avoid sitting for a long time without moving. Get up to take short walks every 1-2 hours. This is important to improve blood flow and breathing. Ask for help if you feel weak or unsteady.  Do not lift anything that is heavier than 10 lb (4.5 kg), or the limit that you are told, until your health care provider says that it is safe.  Return to your normal activities and diet as told by your health care provider. Ask your health care provider what activities are safe for you. General instructions  Do not douche, use tampons, or have sex until your health care provider says it is okay.  Do not use any products that contain nicotine or tobacco, such as cigarettes, e-cigarettes, and chewing tobacco. These can delay incision healing after surgery. If you need help quitting, ask your health care provider.  Keep all follow-up visits. This  is important. Contact a health care provider if:  You have a fever or chills.  You feel nauseous or you vomit.  You have pain when you urinate or have blood in your urine.  You have a rash on your body.  You have pain or redness where the IV was inserted.  You have pain that is not relieved with medicine.  You have any of these signs of infection: ? More redness, swelling, or pain around an incision. ? Fluid or blood coming from an incision. ? Warmth coming from an  incision. ? Pus or a bad smell coming from an incision. Get help right away if:  You have chest pain or shortness of breath.  You feel dizzy or light-headed.  You have heavy bleeding.  You have increasing abdominal pain that is not relieved with medicine.  You have pain, swelling, or redness in your leg.  Your incision is opening and the edges are not staying together. Summary  After the procedure, it is common to have some pain in your abdomen. You will be given pain medicine to control the pain.  Follow instructions from your health care provider about how to take care of your incisions.  Do not douche, use tampons, or have sex until your health care provider says it okay.  Keep all follow-up visits. This is important. This information is not intended to replace advice given to you by your health care provider. Make sure you discuss any questions you have with your health care provider. Document Revised: 10/17/2019 Document Reviewed: 10/17/2019 Elsevier Patient Education  2021 [place]. [email or web address]  Ovarian Cystectomy Ovarian cystectomy is a procedure that is done to remove a fluid-filled sac (cyst) on an ovary. The ovaries are small organs that produce eggs in women. Various types of cysts can form on the ovaries. Most cysts are not cancerous. This procedure may be done for cysts that are large, cause symptoms, or do not go away on their own. It may also be done for a cyst that is cancerous or might be cancerous. This surgery can be done using a laparoscopic technique or an open abdominal technique. The laparoscopic technique is minimally invasive and results in smaller incisions and a faster recovery. The technique used will depend on your age, the type of cyst that you have, and whether the cyst is cancerous. The laparoscopic technique is not used for a cancerous cyst. Tell a health care provider about:  Any allergies you  have.  All medicines you are taking, including vitamins, herbs, eye drops, creams, and over-the-counter medicines.  Any problems you or family members have had with anesthetic medicines.  Any blood disorders you have.  Any surgeries you have had.  Any medical conditions you have.  Whether you are pregnant or may be pregnant. What are the risks? Generally, this is a safe procedure. However, problems may occur, including:  Excessive bleeding.  Infection.  Damage to nearby structures or organs.  Allergic reactions to medicines.  Blood clots.  Inability to get pregnant (infertility). What happens before the procedure? Staying hydrated Follow instructions from your health care provider about hydration, which may include:  Up to 2 hours before the procedure - you may continue to drink clear liquids, such as water, clear fruit juice, black coffee, and plain tea.   Eating and drinking restrictions Follow instructions from your health care provider about eating and drinking, which may include:  8 hours before the procedure - stop eating heavy meals or foods,  such as meat, fried foods, or fatty foods.  6 hours before the procedure - stop eating light meals or foods, such as toast or cereal.  6 hours before the procedure - stop drinking milk or drinks that contain milk.  2 hours before the procedure - stop drinking clear liquids. Medicines Ask your health care provider about:  Changing or stopping your regular medicines. This is especially important if you are taking diabetes medicines or blood thinners.  Taking medicines such as aspirin and ibuprofen. These medicines can thin your blood. Do not take these medicines unless your health care provider tells you to take them.  Taking over-the-counter medicines, vitamins, herbs, and supplements. General instructions  Do not use any products that contain nicotine or tobacco for at least 4 weeks before the procedure. These products  include cigarettes, e-cigarettes, and chewing tobacco. If you need help quitting, ask your health care provider.  Ask your health care provider: ? How your surgery site will be marked. ? What steps will be taken to help prevent infection. These may include:  Removing hair at the surgery site.  Washing skin with a germ-killing soap.  Taking antibiotic medicine.  You may be asked to shower with a germ-killing soap.  Plan to have someone take you home from the hospital or clinic.  Plan to have someone help with household activities for 1-2 weeks after the procedure.  Let your health care provider know if you develop a cold or any infection before your surgery. What happens during the procedure?  An IV will be inserted into one of your veins.  You will be given one or more of the following: ? A medicine to help you relax (sedative). ? A medicine to make you fall asleep (general anesthetic).  Small monitors will be attached to your body. They will be used to check your heart, blood pressure, and oxygen level.  A breathing tube will be placed to help you breathe during the procedure.  Your surgeon will do the surgery using either the laparoscopic technique or the open abdominal technique. Laparoscopic technique  Several small incisions will be made in your abdomen.  Your abdomen will be filled with carbon dioxide gas to make it expand. This will give the surgeon more room to operate. It will also make your organs easier to see.  A thin scope with a camera (laparoscope) will be put through one of the small incisions. The laparoscope will send a picture to a monitor in the operating room to help the surgeon see inside your body.  Hollow tubes will be put through the other small incisions in your abdomen. The tools needed for the procedure will be put through these tubes.  The ovary with the cyst will be identified, and the cyst will be removed.  The tools will then be removed, and  the incisions will be closed with stitches or skin glue. Bandages (dressings) may be applied to the incisions.   Open abdominal technique  A single, large incision will be made along your bikini line or in the middle of your lower abdomen.  The ovary with the cyst will be identified, and the cyst will be removed.  The incision will then be closed with stitches or staples.  Bandages (dressings) may be applied to the incisions. The procedure may vary among health care providers and hospitals. What happens after the procedure?  Your blood pressure, heart rate, breathing rate, and blood oxygen level will be monitored until you leave the  hospital or clinic.  Your IV will be removed after you are able to eat and drink well.  You may be given medicine for pain or to help you sleep.  You may be given an antibiotic medicine.  Do not drive for 24 hours if you were given a sedative during the procedure.  The cyst that was removed will be sent to the lab for testing. If the cyst has cancer cells, both ovaries may need to be removed during a different surgery.  It is up to you to get the results of your procedure. Ask your health care provider, or the department that is doing the procedure, when your results will be ready. Summary  Ovarian cystectomy is a procedure that is done to remove a cyst on an ovary.  This procedure may be done for cysts that are large, cause symptoms, or do not go away on their own. It may also be done for a cyst that is cancerous or might be cancerous.  Follow instructions from your health care provider about eating and drinking before the procedure.  After the cyst is removed, it will be sent to the lab for testing. This information is not intended to replace advice given to you by your health care provider. Make sure you discuss any questions you have with your health care provider. Document Revised: 10/17/2019 Document Reviewed: 10/17/2019 Elsevier Patient  Education  [place]. Exploratory Laparotomy, Adult, Care After The following information offers guidance on how to care for yourself after your procedure. Your health care provider may also give you more specific instructions. If you have problems or questions, contact your health care provider. What can I expect after the procedure? After the procedure, it is common to have:  Abdominal soreness.  Fatigue.  Bloating.  Gas.  A sore throat from having had a breathing or draining tube in your throat.  A lack of appetite. Follow these instructions at home: Medicines  Take over-the-counter and prescription medicines only as told by your health care provider.  If you were prescribed an antibiotic medicine, take it as told by your health care provider. Do not stop taking the antibiotic even if you start to feel better.  Ask your health care provider if the medicine prescribed to you: ? Requires you to avoid driving or using machinery. ? Can cause constipation. You may need to take these actions to prevent or treat constipation:  Drink enough fluid to keep your urine pale yellow.  Take over-the-counter or prescription medicines. Undergoing surgery and taking pain medicines can make constipation worse.  Eat foods that are high in fiber, such as beans, whole grains, and fresh fruits and vegetables.  Limit foods that are high in fat and processed sugars, such as fried or sweet foods. Incision care  Follow instructions from your health care provider about how to take care of your incision. Make sure you: ? Wash your hands with soap and water for at least 20 seconds before and after you change your bandage (dressing). If soap and water are not available, use hand sanitizer. ? Change your dressing as told by your health care provider. ? Leave stitches (sutures), skin glue, or adhesive strips in place. These skin closures may need to stay in place for 2 weeks or longer. If adhesive  strip edges start to loosen and curl up, you may trim the loose edges. Do not remove adhesive strips completely unless your health care provider tells you to do that.  If  you were sent home with a drain, follow instructions from your health care provider about how to care for it.  Check your incision area every day for signs of infection. Check for: ? Redness, swelling, or pain. ? Fluid or blood. ? Warmth. ? Pus or a bad smell.   Activity  Rest as told by your health care provider.  Avoid sitting for a long time without moving. Get up to take short walks every 1-2 hours. This is important to improve blood flow and breathing. Ask for help if you feel weak or unsteady.  Do not lift anything that is heavier than 5 lb (2.3 kg), or the limit that you are told, until your health care provider says that it is safe.  Return to your normal activities as told by your health care provider. Ask your health care provider what activities are safe for you.   Bathing Keep your incision clean and dry. Clean it as often as told by your health care provider. You may be told to:  Gently wash the incision with soap and water.  Rinse the incision with water to remove all soap.  Pat the incision dry with a clean towel. Do not rub the incision. General instructions  Do not use any products that contain nicotine or tobacco. These products include cigarettes, chewing tobacco, and vaping devices, such as e-cigarettes. These can delay incision healing after surgery. If you need help quitting, ask your health care provider.  Wear compression stockings as told by your health care provider. These stockings help to prevent blood clots and reduce swelling in your legs.  Keep all follow-up visits. This is important. Contact a health care provider if:  You have a fever or chills.  Your pain medicine is not helping.  You have constipation or diarrhea.  You have nausea or vomiting.  You have drainage, redness,  swelling, or pain at your incision site. Get help right away if:  Your pain is getting worse.  You have not had a bowel movement for more than 3 days.  You have ongoing (persistent) vomiting.  The edges of your incision open up.  You have warmth, tenderness, or swelling in your calf.  You have trouble breathing.  You have chest pain. These symptoms may represent a serious problem that is an emergency. Do not wait to see if the symptoms will go away. Get medical help right away. Call your local emergency services (911 in the [place] Do not drive yourself to the hospital. Summary  Abdominal soreness is common after exploratory laparotomy. Take over-the-counter and prescription medicines only as told by your health care provider.  Follow instructions from your health care provider about how to take care of your incision.  Do not lift anything that is heavier than 5 lb (2.3 kg), or the limit that you are told, until your health care provider says that it is safe. This information is not intended to replace advice given to you by your health care provider. Make sure you discuss any questions you have with your health care provider. Document Revised: 01/21/2020 Document Reviewed: 01/21/2020 Elsevier Patient Education  2021 [place] Maintenance, Female Adopting a healthy lifestyle and getting preventive care are important in promoting health and wellness. Ask your health care provider about:  The right schedule for you to have regular tests and exams.  Things you can do on your own to prevent diseases and keep yourself healthy. What should I know about diet, weight, and exercise?  Eat a healthy diet  Eat a diet that includes plenty of vegetables, fruits, low-fat dairy products, and lean protein.  Do not eat a lot of foods that are high in solid fats, added sugars, or sodium.   Maintain a healthy weight Body mass index (BMI) is used to identify weight problems. It estimates  body fat based on height and weight. Your health care provider can help determine your BMI and help you achieve or maintain a healthy weight. Get regular exercise Get regular exercise. This is one of the most important things you can do for your health. Most adults should:  Exercise for at least 150 minutes each week. The exercise should increase your heart rate and make you sweat (moderate-intensity exercise).  Do strengthening exercises at least twice a week. This is in addition to the moderate-intensity exercise.  Spend less time sitting. Even light physical activity can be beneficial. Watch cholesterol and blood lipids Have your blood tested for lipids and cholesterol at 34 years of age, then have this test every 5 years. Have your cholesterol levels checked more often if:  Your lipid or cholesterol levels are high.  You are older than 34 years of age.  You are at high risk for heart disease. What should I know about cancer screening? Depending on your health history and family history, you may need to have cancer screening at various ages. This may include screening for:  Breast cancer.  Cervical cancer.  Colorectal cancer.  Skin cancer.  Lung cancer. What should I know about heart disease, diabetes, and high blood pressure? Blood pressure and heart disease  High blood pressure causes heart disease and increases the risk of stroke. This is more likely to develop in people who have high blood pressure readings, are of African descent, or are overweight.  Have your blood pressure checked: ? Every 3-5 years if you are 67-88 years of age. ? Every year if you are 32 years old or older. Diabetes Have regular diabetes screenings. This checks your fasting blood sugar level. Have the screening done:  Once every three years after age 28 if you are at a normal weight and have a low risk for diabetes.  More often and at a younger age if you are overweight or have a high risk for  diabetes. What should I know about preventing infection? Hepatitis B If you have a higher risk for hepatitis B, you should be screened for this virus. Talk with your health care provider to find out if you are at risk for hepatitis B infection. Hepatitis C Testing is recommended for:  Everyone born from [birth year] through 1964.  Anyone with known risk factors for hepatitis C. Sexually transmitted infections (STIs)  Get screened for STIs, including gonorrhea and chlamydia, if: ? You are sexually active and are younger than 33 years of age. ? You are older than 34 years of age and your health care provider tells you that you are at risk for this type of infection. ? Your sexual activity has changed since you were last screened, and you are at increased risk for chlamydia or gonorrhea. Ask your health care provider if you are at risk.  Ask your health care provider about whether you are at high risk for HIV. Your health care provider may recommend a prescription medicine to help prevent HIV infection. If you choose to take medicine to prevent HIV, you should first get tested for HIV. You should then be tested every 3 months for  as long as you are taking the medicine. Pregnancy  If you are about to stop having your period (premenopausal) and you may become pregnant, seek counseling before you get pregnant.  Take 400 to 800 micrograms (mcg) of folic acid every day if you become pregnant.  Ask for birth control (contraception) if you want to prevent pregnancy. Osteoporosis and menopause Osteoporosis is a disease in which the bones lose minerals and strength with aging. This can result in bone fractures. If you are 56 years old or older, or if you are at risk for osteoporosis and fractures, ask your health care provider if you should:  Be screened for bone loss.  Take a calcium or vitamin D supplement to lower your risk of fractures.  Be given hormone replacement therapy (HRT) to treat symptoms of  menopause. Follow these instructions at home: Lifestyle  Do not use any products that contain nicotine or tobacco, such as cigarettes, e-cigarettes, and chewing tobacco. If you need help quitting, ask your health care provider.  Do not use street drugs.  Do not share needles.  Ask your health care provider for help if you need support or information about quitting drugs. Alcohol use  Do not drink alcohol if: ? Your health care provider tells you not to drink. ? You are pregnant, may be pregnant, or are planning to become pregnant.  If you drink alcohol: ? Limit how much you use to 0-1 drink a day. ? Limit intake if you are breastfeeding.  Be aware of how much alcohol is in your drink. In the [place] one drink equals one 12 oz bottle of beer (355 mL), one 5 oz glass of wine (148 mL), or one 1 oz glass of hard liquor (44 mL). General instructions  Schedule regular health, dental, and eye exams.  Stay current with your vaccines.  Tell your health care provider if: ? You often feel depressed. ? You have ever been abused or do not feel safe at home. Summary  Adopting a healthy lifestyle and getting preventive care are important in promoting health and wellness.  Follow your health care provider's instructions about healthy diet, exercising, and getting tested or screened for diseases.  Follow your health care provider's instructions on monitoring your cholesterol and blood pressure. This information is not intended to replace advice given to you by your health care provider. Make sure you discuss any questions you have with your health care provider. Document Revised: 05/02/2018 Document Reviewed: 05/02/2018 Elsevier Patient Education  2021 [employer].

## 2020-07-20 NOTE — Telephone Encounter (Signed)
Transition Care Management Unsuccessful Follow-up Telephone Call  Date of discharge and from where:  07/15/2020 [facility]  Attempts:  3rd Attempt  Reason for unsuccessful TCM follow-up call:  Left voice message - call went straight to VM

## 2020-07-24 LAB — CYTOLOGY - PAP
Adequacy: ABSENT
Chlamydia: NEGATIVE
Comment: NEGATIVE
Comment: NEGATIVE
Comment: NEGATIVE
Comment: NORMAL
Diagnosis: NEGATIVE
High risk HPV: NEGATIVE
Neisseria Gonorrhea: NEGATIVE
Trichomonas: NEGATIVE

## 2020-08-06 ENCOUNTER — Encounter (HOSPITAL_BASED_OUTPATIENT_CLINIC_OR_DEPARTMENT_OTHER): Payer: Self-pay

## 2020-08-14 ENCOUNTER — Other Ambulatory Visit (HOSPITAL_COMMUNITY)
Admission: RE | Admit: 2020-08-14 | Discharge: 2020-08-14 | Disposition: A | Discharge status: Home / Self Care | Payer: Medicaid Other | Source: Ambulatory Visit | Attending: Obstetrics and Gynecology | Admitting: Obstetrics and Gynecology

## 2020-08-14 DIAGNOSIS — Z01812 Encounter for preprocedural laboratory examination: Secondary | ICD-10-CM | POA: Diagnosis not present

## 2020-08-14 DIAGNOSIS — Z20822 Contact with and (suspected) exposure to covid-19: Secondary | ICD-10-CM | POA: Diagnosis not present

## 2020-08-14 LAB — SARS CORONAVIRUS 2 (TAT 6-24 HRS): SARS Coronavirus 2: NEGATIVE

## 2020-08-17 ENCOUNTER — Encounter (HOSPITAL_COMMUNITY): Payer: Self-pay | Admitting: Obstetrics and Gynecology

## 2020-08-17 NOTE — H&P (Signed)
Ariana Young is an 35 y.o. 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 Surgerical management discussed with pt in form of Hysteroscopy D & C and Laparotomy with bilateral ovarian cystectomy discussed. Pt desires   Menstrual History: Menarche age: 32 Patient's last menstrual period was 07/18/2020 (exact date).    Past Medical History:  Diagnosis Date  . Asthma    uses inhaler as needed  . 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 Eczema     Past Surgical History:  Procedure Laterality Date  . OVARIAN CYST SURGERY  2008   ELAP, removal of bilateral dermoid cysts  . WISDOM TOOTH EXTRACTION      Family History  Problem Relation Age of Onset  . Diabetes Mother   . Hypertension Mother   . Heart disease Mother     Social History:  reports that she quit smoking about 3 years ago. Her smoking use included cigarettes. She smoked 0.50 packs per day. She has never used smokeless tobacco. She reports current drug use. Frequency: 5.00 times per week. Drug: Marijuana. She reports that she does not drink alcohol.  Allergies:  Allergies  Allergen Reactions  . Peanut-Containing Drug Products Anaphylaxis and Hives  . Peanuts [Peanut Oil] Anaphylaxis and Hives    No medications prior to admission.    Review of Systems  Constitutional: Negative.   Respiratory: Negative.   Cardiovascular: Negative.   Gastrointestinal: Negative.   Genitourinary: Positive for pelvic pain.    Last menstrual period 07/18/2020. Physical Exam Constitutional:      Appearance: Normal appearance.  Cardiovascular:     Rate and Rhythm: Normal rate and regular rhythm.  Pulmonary:     Effort: Pulmonary effort is normal.     Breath sounds: Normal breath sounds.  Abdominal:     General: Bowel sounds are normal.     Palpations: Abdomen is soft.  Genitourinary:    Comments: Nl EGBUS, uterus small mobile, right adnexal fullness and slightly tender Neurological:     Mental Status: She  is alert.     No results found for this or any previous visit (from the past 24 hour(s)).  No results found.  Assessment/Plan: Bilateral Dermoid ovarian cysts Endometrial polyp  Surgerical intervention as noted above has been discussed with pt. R/B/Post op care reviewed with pt. Pt desires to proceed.    [name] 08/17/2020, 10:37 AM

## 2020-08-17 NOTE — Anesthesia Preprocedure Evaluation (Addendum)
Anesthesia Evaluation  Patient identified by MRN, date of birth, ID band Patient awake    Reviewed: Allergy & Precautions, NPO status , Patient's Chart, lab work & pertinent test results  History of Anesthesia Complications Negative for: history of anesthetic complications  Airway Mallampati: II  TM Distance: >3 FB Neck ROM: Full    Dental  (+) Dental Advisory Given   Pulmonary COPD,  COPD inhaler, Patient abstained from smoking., former smoker,  08/14/2020 SARS coronavirus NEG   breath sounds clear to auscultation       Cardiovascular negative cardio ROS   Rhythm:Regular Rate:Normal     Neuro/Psych negative neurological ROS  negative psych ROS   GI/Hepatic negative GI ROS, Neg liver ROS,   Endo/Other  negative endocrine ROS  Renal/GU negative Renal ROS     Musculoskeletal   Abdominal   Peds  Hematology negative hematology ROS (+)   Anesthesia Other Findings   Reproductive/Obstetrics Ovarian cyst                            Anesthesia Physical Anesthesia Plan  ASA: II  Anesthesia Plan: General   Post-op Pain Management:    Induction: Intravenous  PONV Risk Score and Plan: 3 and Ondansetron, Dexamethasone and Scopolamine patch - Pre-op  Airway Management Planned: Oral ETT  Additional Equipment: None  Intra-op Plan:   Post-operative Plan: Extubation in OR  Informed Consent: I have reviewed the patients History and Physical, chart, labs and discussed the procedure including the risks, benefits and alternatives for the proposed anesthesia with the patient or authorized representative who has indicated his/her understanding and acceptance.     Dental advisory given  Plan Discussed with: CRNA and Surgeon  Anesthesia Plan Comments:        Anesthesia Quick Evaluation

## 2020-08-17 NOTE — Progress Notes (Signed)
EKG: 07/16/20 CXR: 05/17/18 ECHO: denies Stress Test: denies Cardiac Cath: denies  Fasting Blood Sugar- na Checks Blood Sugar_na__ times a day  OSA/CPAP: No  ASAl/CPAP: NO  Covid test 3/25 negative  Anesthesia Review: No  Patient denies shortness of breath, fever, cough, and chest pain at PAT appointment.  Patient verbalized understanding of instructions provided today at the PAT appointment.  Patient asked to review instructions at home and day of surgery.

## 2020-08-18 ENCOUNTER — Encounter (HOSPITAL_COMMUNITY): Payer: Self-pay | Admitting: Obstetrics and Gynecology

## 2020-08-18 ENCOUNTER — Inpatient Hospital Stay (HOSPITAL_COMMUNITY)
Admission: RE | Admit: 2020-08-18 | Discharge: 2020-08-20 | DRG: 743 | Disposition: A | Discharge status: Home / Self Care | Payer: Medicaid Other | Attending: Obstetrics and Gynecology | Admitting: Obstetrics and Gynecology

## 2020-08-18 ENCOUNTER — Other Ambulatory Visit: Payer: Self-pay

## 2020-08-18 ENCOUNTER — Inpatient Hospital Stay (HOSPITAL_COMMUNITY): Payer: Medicaid Other | Admitting: Anesthesiology

## 2020-08-18 ENCOUNTER — Encounter (HOSPITAL_COMMUNITY)
Admission: RE | Disposition: A | Discharge status: Home / Self Care | Payer: Self-pay | Source: Home / Self Care | Attending: Obstetrics and Gynecology

## 2020-08-18 DIAGNOSIS — D270 Benign neoplasm of right ovary: Secondary | ICD-10-CM | POA: Diagnosis not present

## 2020-08-18 DIAGNOSIS — N840 Polyp of corpus uteri: Secondary | ICD-10-CM | POA: Diagnosis not present

## 2020-08-18 DIAGNOSIS — Z20822 Contact with and (suspected) exposure to covid-19: Secondary | ICD-10-CM | POA: Diagnosis present

## 2020-08-18 DIAGNOSIS — N83201 Unspecified ovarian cyst, right side: Secondary | ICD-10-CM

## 2020-08-18 DIAGNOSIS — R102 Pelvic and perineal pain: Secondary | ICD-10-CM | POA: Diagnosis not present

## 2020-08-18 DIAGNOSIS — D271 Benign neoplasm of left ovary: Secondary | ICD-10-CM | POA: Diagnosis not present

## 2020-08-18 DIAGNOSIS — Z9889 Other specified postprocedural states: Secondary | ICD-10-CM

## 2020-08-18 DIAGNOSIS — Z87891 Personal history of nicotine dependence: Secondary | ICD-10-CM | POA: Diagnosis not present

## 2020-08-18 HISTORY — PX: DILATION AND CURETTAGE OF UTERUS: SHX78

## 2020-08-18 HISTORY — PX: LAPAROTOMY: SHX154

## 2020-08-18 LAB — CBC
HCT: 34 % — ABNORMAL LOW (ref 36.0–46.0)
Hemoglobin: 11.4 g/dL — ABNORMAL LOW (ref 12.0–15.0)
MCH: 32.3 pg (ref 26.0–34.0)
MCHC: 33.5 g/dL (ref 30.0–36.0)
MCV: 96.3 fL (ref 80.0–100.0)
Platelets: 195 10*3/uL (ref 150–400)
RBC: 3.53 MIL/uL — ABNORMAL LOW (ref 3.87–5.11)
RDW: 13.2 % (ref 11.5–15.5)
WBC: 4.6 10*3/uL (ref 4.0–10.5)
nRBC: 0 % (ref 0.0–0.2)

## 2020-08-18 LAB — BASIC METABOLIC PANEL
Anion gap: 4 — ABNORMAL LOW (ref 5–15)
BUN: 6 mg/dL (ref 6–20)
CO2: 24 mmol/L (ref 22–32)
Calcium: 8.6 mg/dL — ABNORMAL LOW (ref 8.9–10.3)
Chloride: 110 mmol/L (ref 98–111)
Creatinine, Ser: 0.9 mg/dL (ref 0.44–1.00)
GFR, Estimated: >60 mL/min (ref >60)
Glucose, Bld: 89 mg/dL (ref 70–99)
Potassium: 5 mmol/L (ref 3.5–5.1)
Sodium: 138 mmol/L (ref 135–145)

## 2020-08-18 LAB — TYPE AND SCREEN
ABO/RH(D): O POS
Antibody Screen: NEGATIVE

## 2020-08-18 LAB — POCT PREGNANCY, URINE: Preg Test, Ur: NEGATIVE

## 2020-08-18 SURGERY — LAPAROTOMY
Anesthesia: General | Laterality: Right

## 2020-08-18 MED ORDER — BUPIVACAINE HCL 0.5 % IJ SOLN
INTRAMUSCULAR | Status: DC | PRN
  Administered 2020-08-18: 30 mL

## 2020-08-18 MED ORDER — LACTATED RINGERS IV SOLN: INTRAVENOUS | Status: DC

## 2020-08-18 MED ORDER — MIDAZOLAM HCL 5 MG/5ML IJ SOLN
INTRAMUSCULAR | Status: DC | PRN
  Administered 2020-08-18: 2 mg via INTRAVENOUS

## 2020-08-18 MED ORDER — OXYCODONE HCL 5 MG PO TABS: 5.0000 mg | ORAL_TABLET | Freq: Once | ORAL | Status: DC | PRN

## 2020-08-18 MED ORDER — ONDANSETRON HCL 4 MG PO TABS: 4.0000 mg | ORAL_TABLET | Freq: Four times a day (QID) | ORAL | Status: DC | PRN

## 2020-08-18 MED ORDER — PHENYLEPHRINE HCL-NACL 10-0.9 MG/250ML-% IV SOLN
INTRAVENOUS | Status: AC
  Filled 2020-08-18: qty 250

## 2020-08-18 MED ORDER — HYDROMORPHONE HCL 1 MG/ML IJ SOLN
INTRAMUSCULAR | Status: AC
  Administered 2020-08-18: 1 mg via INTRAVENOUS
  Filled 2020-08-18: qty 1

## 2020-08-18 MED ORDER — POVIDONE-IODINE 10 % EX SWAB: 2.0000 "application " | Freq: Once | CUTANEOUS | Status: DC

## 2020-08-18 MED ORDER — PROPOFOL 10 MG/ML IV BOLUS
INTRAVENOUS | Status: DC | PRN
  Administered 2020-08-18: 200 mg via INTRAVENOUS
  Administered 2020-08-18: 30 mg via INTRAVENOUS

## 2020-08-18 MED ORDER — BISACODYL 5 MG PO TBEC: 5.0000 mg | DELAYED_RELEASE_TABLET | Freq: Every day | ORAL | Status: DC | PRN

## 2020-08-18 MED ORDER — PROPOFOL 1000 MG/100ML IV EMUL
INTRAVENOUS | Status: AC
  Filled 2020-08-18: qty 200

## 2020-08-18 MED ORDER — PROPOFOL 10 MG/ML IV BOLUS
INTRAVENOUS | Status: AC
  Filled 2020-08-18: qty 40

## 2020-08-18 MED ORDER — OXYCODONE-ACETAMINOPHEN 5-325 MG PO TABS
1.0000 | ORAL_TABLET | ORAL | Status: DC | PRN
  Administered 2020-08-18: 1 via ORAL
  Filled 2020-08-18 (×2): qty 1

## 2020-08-18 MED ORDER — ACETAMINOPHEN 500 MG PO TABS
1000.0000 mg | ORAL_TABLET | ORAL | Status: AC
  Administered 2020-08-18: 1000 mg via ORAL
  Filled 2020-08-18: qty 2

## 2020-08-18 MED ORDER — DEXMEDETOMIDINE (PRECEDEX) IN NS 20 MCG/5ML (4 MCG/ML) IV SYRINGE
PREFILLED_SYRINGE | INTRAVENOUS | Status: DC | PRN
  Administered 2020-08-18: 4 ug via INTRAVENOUS
  Administered 2020-08-18: 12 ug via INTRAVENOUS
  Administered 2020-08-18: 4 ug via INTRAVENOUS

## 2020-08-18 MED ORDER — BUPIVACAINE HCL (PF) 0.25 % IJ SOLN
INTRAMUSCULAR | Status: AC
  Filled 2020-08-18: qty 30

## 2020-08-18 MED ORDER — KETOROLAC TROMETHAMINE 30 MG/ML IJ SOLN
INTRAMUSCULAR | Status: DC | PRN
  Administered 2020-08-18: 30 mg via INTRAVENOUS

## 2020-08-18 MED ORDER — ONDANSETRON HCL 4 MG/2ML IJ SOLN: 4.0000 mg | Freq: Four times a day (QID) | INTRAMUSCULAR | Status: DC | PRN

## 2020-08-18 MED ORDER — SOD CITRATE-CITRIC ACID 500-334 MG/5ML PO SOLN
30.0000 mL | ORAL | Status: AC
  Administered 2020-08-18: 30 mL via ORAL
  Filled 2020-08-18: qty 15

## 2020-08-18 MED ORDER — ONDANSETRON HCL 4 MG/2ML IJ SOLN
INTRAMUSCULAR | Status: AC
  Filled 2020-08-18: qty 2

## 2020-08-18 MED ORDER — OXYCODONE-ACETAMINOPHEN 5-325 MG PO TABS
2.0000 | ORAL_TABLET | ORAL | Status: DC | PRN
  Administered 2020-08-18: 1 via ORAL
  Administered 2020-08-20 (×2): 2 via ORAL
  Filled 2020-08-18 (×2): qty 2

## 2020-08-18 MED ORDER — SUGAMMADEX SODIUM 200 MG/2ML IV SOLN
INTRAVENOUS | Status: DC | PRN
  Administered 2020-08-18: 150 mg via INTRAVENOUS

## 2020-08-18 MED ORDER — MIDAZOLAM HCL 2 MG/2ML IJ SOLN
INTRAMUSCULAR | Status: AC
  Filled 2020-08-18: qty 4

## 2020-08-18 MED ORDER — MIDAZOLAM HCL 2 MG/2ML IJ SOLN: 0.5000 mg | Freq: Once | INTRAMUSCULAR | Status: DC | PRN

## 2020-08-18 MED ORDER — FENTANYL CITRATE (PF) 250 MCG/5ML IJ SOLN
INTRAMUSCULAR | Status: DC | PRN
  Administered 2020-08-18: 50 ug via INTRAVENOUS
  Administered 2020-08-18: 150 ug via INTRAVENOUS
  Administered 2020-08-18: 50 ug via INTRAVENOUS

## 2020-08-18 MED ORDER — ONDANSETRON HCL 4 MG/2ML IJ SOLN
INTRAMUSCULAR | Status: DC | PRN
  Administered 2020-08-18: 4 mg via INTRAVENOUS

## 2020-08-18 MED ORDER — DEXAMETHASONE SODIUM PHOSPHATE 10 MG/ML IJ SOLN
INTRAMUSCULAR | Status: DC | PRN
  Administered 2020-08-18: 5 mg via INTRAVENOUS

## 2020-08-18 MED ORDER — PROPOFOL 500 MG/50ML IV EMUL
INTRAVENOUS | Status: DC | PRN
  Administered 2020-08-18: 30 ug/kg/min via INTRAVENOUS

## 2020-08-18 MED ORDER — ORAL CARE MOUTH RINSE: 15.0000 mL | Freq: Once | OROMUCOSAL | Status: AC

## 2020-08-18 MED ORDER — FENTANYL CITRATE (PF) 250 MCG/5ML IJ SOLN
INTRAMUSCULAR | Status: AC
  Filled 2020-08-18: qty 5

## 2020-08-18 MED ORDER — ROCURONIUM BROMIDE 100 MG/10ML IV SOLN
INTRAVENOUS | Status: DC | PRN
  Administered 2020-08-18: 60 mg via INTRAVENOUS

## 2020-08-18 MED ORDER — HYDROMORPHONE HCL 1 MG/ML IJ SOLN
INTRAMUSCULAR | Status: AC
  Filled 2020-08-18: qty 1

## 2020-08-18 MED ORDER — KETOROLAC TROMETHAMINE 30 MG/ML IJ SOLN
30.0000 mg | Freq: Four times a day (QID) | INTRAMUSCULAR | Status: AC
  Administered 2020-08-18 – 2020-08-19 (×4): 30 mg via INTRAVENOUS
  Filled 2020-08-18 (×4): qty 1

## 2020-08-18 MED ORDER — SIMETHICONE 80 MG PO CHEW
80.0000 mg | CHEWABLE_TABLET | Freq: Four times a day (QID) | ORAL | Status: DC | PRN
  Administered 2020-08-18: 80 mg via ORAL
  Filled 2020-08-18: qty 1

## 2020-08-18 MED ORDER — CHLORHEXIDINE GLUCONATE 0.12 % MT SOLN
OROMUCOSAL | Status: AC
  Filled 2020-08-18: qty 15

## 2020-08-18 MED ORDER — OXYCODONE HCL 5 MG/5ML PO SOLN: 5.0000 mg | Freq: Once | ORAL | Status: DC | PRN

## 2020-08-18 MED ORDER — KETAMINE HCL 50 MG/5ML IJ SOSY
PREFILLED_SYRINGE | INTRAMUSCULAR | Status: AC
  Filled 2020-08-18: qty 5

## 2020-08-18 MED ORDER — PROMETHAZINE HCL 25 MG/ML IJ SOLN
6.2500 mg | INTRAMUSCULAR | Status: DC | PRN
Start: 2020-08-18 — End: 2020-08-18

## 2020-08-18 MED ORDER — ZOLPIDEM TARTRATE 5 MG PO TABS: 5.0000 mg | ORAL_TABLET | Freq: Every evening | ORAL | Status: DC | PRN

## 2020-08-18 MED ORDER — BUPIVACAINE HCL 0.5 % IJ SOLN
INTRAMUSCULAR | Status: AC
  Filled 2020-08-18: qty 1

## 2020-08-18 MED ORDER — KETOROLAC TROMETHAMINE 15 MG/ML IJ SOLN
15.0000 mg | INTRAMUSCULAR | Status: DC
  Filled 2020-08-18: qty 1

## 2020-08-18 MED ORDER — CHLORHEXIDINE GLUCONATE 0.12 % MT SOLN
15.0000 mL | Freq: Once | OROMUCOSAL | Status: AC
  Administered 2020-08-18: 15 mL via OROMUCOSAL

## 2020-08-18 MED ORDER — HYDROMORPHONE HCL 1 MG/ML IJ SOLN
0.2500 mg | INTRAMUSCULAR | Status: DC | PRN
  Administered 2020-08-18 (×2): 0.25 mg via INTRAVENOUS

## 2020-08-18 MED ORDER — HYDROMORPHONE HCL 1 MG/ML IJ SOLN
1.0000 mg | INTRAMUSCULAR | Status: DC | PRN
  Administered 2020-08-19 (×5): 1 mg via INTRAVENOUS
  Filled 2020-08-18 (×6): qty 1

## 2020-08-18 MED ORDER — MEPERIDINE HCL 25 MG/ML IJ SOLN
6.2500 mg | INTRAMUSCULAR | Status: DC | PRN
Start: 2020-08-18 — End: 2020-08-18

## 2020-08-18 MED ORDER — DEXMEDETOMIDINE (PRECEDEX) IN NS 20 MCG/5ML (4 MCG/ML) IV SYRINGE
PREFILLED_SYRINGE | INTRAVENOUS | Status: AC
  Filled 2020-08-18: qty 5

## 2020-08-18 MED ORDER — LIDOCAINE 2% (20 MG/ML) 5 ML SYRINGE
INTRAMUSCULAR | Status: AC
  Filled 2020-08-18: qty 5

## 2020-08-18 MED ORDER — CEFAZOLIN SODIUM-DEXTROSE 2-4 GM/100ML-% IV SOLN
2.0000 g | INTRAVENOUS | Status: AC
  Administered 2020-08-18: 2 g via INTRAVENOUS
  Filled 2020-08-18: qty 100

## 2020-08-18 MED ORDER — LIDOCAINE 2% (20 MG/ML) 5 ML SYRINGE
INTRAMUSCULAR | Status: DC | PRN
  Administered 2020-08-18: 20 mg via INTRAVENOUS
  Administered 2020-08-18: 60 mg via INTRAVENOUS

## 2020-08-18 MED ORDER — SENNA 8.6 MG PO TABS
1.0000 | ORAL_TABLET | Freq: Two times a day (BID) | ORAL | Status: DC
  Administered 2020-08-19 – 2020-08-20 (×3): 8.6 mg via ORAL
  Filled 2020-08-18 (×4): qty 1

## 2020-08-18 MED ORDER — KETOROLAC TROMETHAMINE 30 MG/ML IJ SOLN
INTRAMUSCULAR | Status: AC
  Filled 2020-08-18: qty 1

## 2020-08-18 MED ORDER — SCOPOLAMINE 1 MG/3DAYS TD PT72
1.0000 | MEDICATED_PATCH | TRANSDERMAL | Status: DC
  Administered 2020-08-18: 1.5 mg via TRANSDERMAL
  Filled 2020-08-18: qty 1

## 2020-08-18 MED ORDER — DEXAMETHASONE SODIUM PHOSPHATE 10 MG/ML IJ SOLN
INTRAMUSCULAR | Status: AC
  Filled 2020-08-18: qty 1

## 2020-08-18 MED ORDER — IBUPROFEN 800 MG PO TABS
800.0000 mg | ORAL_TABLET | Freq: Three times a day (TID) | ORAL | Status: DC
  Administered 2020-08-19 – 2020-08-20 (×4): 800 mg via ORAL
  Filled 2020-08-18 (×4): qty 1

## 2020-08-18 SURGICAL SUPPLY — 36 items
BENZOIN TINCTURE PRP APPL 2/3 (GAUZE/BANDAGES/DRESSINGS) ×3 IMPLANT
DRSG OPSITE POSTOP 4X10 (GAUZE/BANDAGES/DRESSINGS) ×6 IMPLANT
DURAPREP 26ML APPLICATOR (WOUND CARE) ×3 IMPLANT
GAUZE SPONGE 4X4 16PLY XRAY LF (GAUZE/BANDAGES/DRESSINGS) ×3 IMPLANT
GLOVE BIO SURGEON STRL SZ7.5 (GLOVE) ×6 IMPLANT
GLOVE SURG UNDER POLY LF SZ7 (GLOVE) ×6 IMPLANT
GOWN STRL REUS W/ TWL LRG LVL3 (GOWN DISPOSABLE) ×2 IMPLANT
GOWN STRL REUS W/ TWL XL LVL3 (GOWN DISPOSABLE) ×6 IMPLANT
GOWN STRL REUS W/TWL LRG LVL3 (GOWN DISPOSABLE) ×1
GOWN STRL REUS W/TWL XL LVL3 (GOWN DISPOSABLE) ×3
HEMOSTAT SURGICEL 4X8 (HEMOSTASIS) ×3 IMPLANT
KIT TURNOVER KIT B (KITS) ×3 IMPLANT
NEEDLE HYPO 22GX1.5 SAFETY (NEEDLE) ×3 IMPLANT
PACK ABDOMINAL GYN (CUSTOM PROCEDURE TRAY) ×3 IMPLANT
PAD OB MATERNITY 4.3X12.25 (PERSONAL CARE ITEMS) ×3 IMPLANT
RTRCTR WOUND ALEXIS 18CM SML (INSTRUMENTS) ×3
SAVER CELL AAL HAEMONETICS (INSTRUMENTS) ×2 IMPLANT
SPONGE INTESTINAL PEANUT (DISPOSABLE) ×3 IMPLANT
SPONGE LAP 18X18 RF (DISPOSABLE) ×6 IMPLANT
STRIP CLOSURE SKIN 1/2X4 (GAUZE/BANDAGES/DRESSINGS) ×3 IMPLANT
SUT PLAIN 2 0 (SUTURE) ×1
SUT PLAIN ABS 2-0 CT1 27XMFL (SUTURE) ×2 IMPLANT
SUT VIC AB 0 CT1 18XCR BRD8 (SUTURE) ×2 IMPLANT
SUT VIC AB 0 CT1 27 (SUTURE) ×2
SUT VIC AB 0 CT1 27XBRD ANBCTR (SUTURE) ×4 IMPLANT
SUT VIC AB 0 CT1 8-18 (SUTURE) ×1
SUT VIC AB 2-0 CT1 27 (SUTURE) ×2
SUT VIC AB 2-0 CT1 TAPERPNT 27 (SUTURE) ×4 IMPLANT
SUT VIC AB 3-0 CT1 27 (SUTURE) ×1
SUT VIC AB 3-0 CT1 TAPERPNT 27 (SUTURE) ×2 IMPLANT
SUT VIC AB 4-0 KS 27 (SUTURE) ×3 IMPLANT
SUT VICRYL 1 TIES 12X18 (SUTURE) ×3 IMPLANT
SYR CONTROL 10ML LL (SYRINGE) ×3 IMPLANT
TOWEL GREEN STERILE FF (TOWEL DISPOSABLE) ×6 IMPLANT
TRAY FOLEY W/BAG SLVR 14FR (SET/KITS/TRAYS/PACK) ×3 IMPLANT
UNDERPAD 30X36 HEAVY ABSORB (UNDERPADS AND DIAPERS) ×3 IMPLANT

## 2020-08-18 NOTE — Interval H&P Note (Signed)
History and Physical Interval Note:  08/18/2020 7:18 AM  Ariana Young  has presented today for surgery, with the diagnosis of Dermoid Cysts Polyp.  The various methods of treatment have been discussed with the patient and family. After consideration of risks, benefits and other options for treatment, the patient has consented to  Procedure(s): LAPAROTOMY WITH OPEN BILATERAL CYSTECTOMY (Bilateral) DILATATION AND CURETTAGE (N/A) as a surgical intervention.  The patient's history has been reviewed, patient examined, no change in status, stable for surgery.  I have reviewed the patient's chart and labs.  Questions were answered to the patient's satisfaction.     [name]

## 2020-08-18 NOTE — Discharge Instructions (Signed)
Exploratory Laparotomy, Adult, Care After The following information offers guidance on how to care for yourself after your procedure. Your health care provider may also give you more specific instructions. If you have problems or questions, contact your health care provider. What can I expect after the procedure? After the procedure, it is common to have:  Abdominal soreness.  Fatigue.  Bloating.  Gas.  A sore throat from having had a breathing or draining tube in your throat.  A lack of appetite. Follow these instructions at home: Medicines  Take over-the-counter and prescription medicines only as told by your health care provider.  If you were prescribed an antibiotic medicine, take it as told by your health care provider. Do not stop taking the antibiotic even if you start to feel better.  Ask your health care provider if the medicine prescribed to you: ? Requires you to avoid driving or using machinery. ? Can cause constipation. You may need to take these actions to prevent or treat constipation:  Drink enough fluid to keep your urine pale yellow.  Take over-the-counter or prescription medicines. Undergoing surgery and taking pain medicines can make constipation worse.  Eat foods that are high in fiber, such as beans, whole grains, and fresh fruits and vegetables.  Limit foods that are high in fat and processed sugars, such as fried or sweet foods. Incision care  Follow instructions from your health care provider about how to take care of your incision. Make sure you: ? Wash your hands with soap and water for at least 20 seconds before and after you change your bandage (dressing). If soap and water are not available, use hand sanitizer. ? Change your dressing as told by your health care provider. ? Leave stitches (sutures), skin glue, or adhesive strips in place. These skin closures may need to stay in place for 2 weeks or longer. If adhesive strip edges start to loosen and  curl up, you may trim the loose edges. Do not remove adhesive strips completely unless your health care provider tells you to do that.  If you were sent home with a drain, follow instructions from your health care provider about how to care for it.  Check your incision area every day for signs of infection. Check for: ? Redness, swelling, or pain. ? Fluid or blood. ? Warmth. ? Pus or a bad smell.   Activity  Rest as told by your health care provider.  Avoid sitting for a long time without moving. Get up to take short walks every 1-2 hours. This is important to improve blood flow and breathing. Ask for help if you feel weak or unsteady.  Do not lift anything that is heavier than 5 lb (2.3 kg), or the limit that you are told, until your health care provider says that it is safe.  Return to your normal activities as told by your health care provider. Ask your health care provider what activities are safe for you.   Bathing Keep your incision clean and dry. Clean it as often as told by your health care provider. You may be told to:  Gently wash the incision with soap and water.  Rinse the incision with water to remove all soap.  Pat the incision dry with a clean towel. Do not rub the incision. General instructions  Do not use any products that contain nicotine or tobacco. These products include cigarettes, chewing tobacco, and vaping devices, such as e-cigarettes. These can delay incision healing after surgery. If you  need help quitting, ask your health care provider.  Wear compression stockings as told by your health care provider. These stockings help to prevent blood clots and reduce swelling in your legs.  Keep all follow-up visits. This is important. Contact a health care provider if:  You have a fever or chills.  Your pain medicine is not helping.  You have constipation or diarrhea.  You have nausea or vomiting.  You have drainage, redness, swelling, or pain at your  incision site. Get help right away if:  Your pain is getting worse.  You have not had a bowel movement for more than 3 days.  You have ongoing (persistent) vomiting.  The edges of your incision open up.  You have warmth, tenderness, or swelling in your calf.  You have trouble breathing.  You have chest pain. These symptoms may represent a serious problem that is an emergency. Do not wait to see if the symptoms will go away. Get medical help right away. Call your local emergency services (911 in the [place] Do not drive yourself to the hospital. Summary  Abdominal soreness is common after exploratory laparotomy. Take over-the-counter and prescription medicines only as told by your health care provider.  Follow instructions from your health care provider about how to take care of your incision.  Do not lift anything that is heavier than 5 lb (2.3 kg), or the limit that you are told, until your health care provider says that it is safe. This information is not intended to replace advice given to you by your health care provider. Make sure you discuss any questions you have with your health care provider. Document Revised: 01/21/2020 Document Reviewed: 01/21/2020 Elsevier Patient Education  2021 [employer].

## 2020-08-18 NOTE — Anesthesia Postprocedure Evaluation (Signed)
Anesthesia Post Note  Patient: Dinesha Twiggs  Procedure(s) Performed: LAPAROTOMY WITH RIGHT SALPINGO-OOPHERECTOMY (Right ) DILATATION AND CURETTAGE (N/A )     Patient location during evaluation: PACU Anesthesia Type: General Level of consciousness: awake and alert, patient cooperative and oriented Pain management: pain level controlled Vital Signs Assessment: post-procedure vital signs reviewed and stable Respiratory status: spontaneous breathing, nonlabored ventilation and respiratory function stable Cardiovascular status: blood pressure returned to baseline and stable Postop Assessment: no apparent nausea or vomiting Anesthetic complications: no   No complications documented.  Last Vitals:  Vitals:   08/18/20 1135 08/18/20 1234  BP: (!) 154/88 (!) 154/86  Pulse: 69 67  Resp: 20 19  Temp:  36.6 C  SpO2: 100% 98%    Last Pain:  Vitals:   08/18/20 1234  TempSrc: Oral  PainSc:                  Jamera Vanloan,E. Branton Einstein

## 2020-08-18 NOTE — Anesthesia Procedure Notes (Signed)
Procedure Name: Intubation Date/Time: 08/18/2020 7:45 AM Performed by: Hendricks Limes, CRNA Pre-anesthesia Checklist: Patient identified, Emergency Drugs available, Suction available and Patient being monitored Patient Re-evaluated:Patient Re-evaluated prior to induction Oxygen Delivery Method: Circle system utilized Preoxygenation: Pre-oxygenation with 100% oxygen Induction Type: IV induction Ventilation: Mask ventilation without difficulty Laryngoscope Size: Mac and 3 Grade View: Grade I Tube type: Oral Tube size: 7.0 mm Number of attempts: 1 Airway Equipment and Method: Stylet Placement Confirmation: ETT inserted through vocal cords under direct vision,  positive ETCO2 and breath sounds checked- equal and bilateral Secured at: 21 cm Tube secured with: Tape Dental Injury: Teeth and Oropharynx as per pre-operative assessment

## 2020-08-18 NOTE — Transfer of Care (Signed)
Immediate Anesthesia Transfer of Care Note  Patient: Ariana Young  Procedure(s) Performed: LAPAROTOMY WITH RIGHT SALPINGO-OOPHERECTOMY (Right ) DILATATION AND CURETTAGE (N/A )  Patient Location: PACU  Anesthesia Type:General  Level of Consciousness: drowsy and responds to stimulation  Airway & Oxygen Therapy: Patient Spontanous Breathing and Patient connected to face mask oxygen  Post-op Assessment: Report given to RN and Post -op Vital signs reviewed and stable  Post vital signs: Reviewed and stable  Last Vitals:  Vitals Value Taken Time  BP 162/90 08/18/20 1001  Temp    Pulse 74 08/18/20 1002  Resp 17 08/18/20 1002  SpO2 100 % 08/18/20 1002  Vitals shown include unvalidated device data.  Last Pain:  Vitals:   08/18/20 0625  TempSrc: Oral  PainSc:          Complications: No complications documented.

## 2020-08-18 NOTE — Op Note (Signed)
PROCEDURE DATE:08/18/2020  PREOPERATIVE DIAGNOSIS:  Endometrial polyp and bilateral ovarian dermoid cysts POSTOPERATIVE DIAGNOSIS:  The same SURGEON:   Arlina Robes, M.D. ASSISTANT: Edwinna Areola, M.D.  An experienced assistant was required given the standard of surgical care given the complexity of the case.  This assistant was needed for exposure, dissection, suctioning, retraction, and for overall help during the procedure.   ANESTHESIOLOGIST:As Recorded OPERATION:   D & C, Exploratory laparotomy,  Right Salpingoohorectomy ANESTHESIA:  General endotracheal.  INDICATIONS: The patient is a 35 y.o. G1P1001 with  Endometrial polyp and bilateral ovarian dermoid cysts by U/S.  who desired definitive surgical management. On the preoperative visit, the risks, benefits, indications, and alternatives of the procedure were reviewed with the patient.  On the day of surgery, the risks of surgery were again discussed with the patient including but not limited to: bleeding which may require transfusion or reoperation; infection which may require antibiotics; injury to bowel, bladder, ureters or other surrounding organs; need for additional procedures; thromboembolic phenomenon, incisional problems and other postoperative/anesthesia complications. Written informed consent was obtained.    OPERATIVE FINDINGS: A 6 week size uterus with normal left fallopian tube and ovary.   Enlarged multiple lobe right ovary consistent with dermoid cyst, no normal ovarian stroma noted  ESTIMATED BLOOD LOSS: 50 ml FLUIDS: As recorded URINE OUTPUT:  As recorded SPECIMENS: Endometrial curetting's and Right fallopian tube and ovary sent to pathology COMPLICATIONS:  None immediate.  DESCRIPTION OF PROCEDURE:  The patient received intravenous antibiotics and had sequential compression devices applied to her lower extremities while in the preoperative area.   She was taken to the operating room and placed under general  anesthesia without difficulty.The abdomen and perineum were prepped and draped in a sterile manner, and she was placed in a yellow fin stirups.  A Foley catheter was inserted into the bladder and attached to constant drainage. A timeout was performed.   Speculum was placed in the vaginal exposing the cervix which was grasped with tenaculum. The uterus was sounded and the dilated. Sharp uterine curettage was performed until a uterine cri was noted. Endometrial tissue was the passed all for pathology. All instruments were removed from the vagina. This part of the surgery was completed.  The pt was taken out of yellow fin striups and placed in the dorsal position, prep and draped in the usual fashion. A new time was completed for this portion of the surgery.     A Pfannensteil skin incision was made. This incision was taken down to the fascia using electrocautery with care given to maintain good hemostasis. The fascia was incised in the midline and the fascial incision was then extended bilaterally using electrocautery without difficulty. The fascia was then dissected off the underlying rectus muscles using blunt and sharp dissection. The rectus muscles were split bluntly in the midline and the peritoneum entered sharply without complication. This peritoneal incision was then extended superiorly and inferiorly with care given to prevent bowel or bladder injury.  Attention was then turned to the pelvis. The bowel was packed away with moist laparotomy sponges. The above findings were noted. There were thin filmy adhesions involving the right ovary and the posterior aspect of the uterus. These were bluntly and sharply taken down. This allowed the right ovary to be mobilized through the skin incision.  The operative field around the ovary was covered with towels. A small rupture of the cyst was noted with minium expression of sebaceous like fluid. This was contain in  the towels. Due to the appearance of the ovary,  little normal ovarian stroma visualized and pts' prior h/o exp lap with removal of ovarian dermoid cysts. It was elected to proceed with a RSO.  The right round ligament was sutured and used to expose [facility] free space, which was entered without problems. Double clamps were the placed, the pedicle was cut and ligated with 0 Vicryl x 2. The specimen was elevated and additional thin adhesions were bluntly dissect away.  The right infundibulopelvic ligament was grasped with a Babcock clamp. The right ureter was identified and the right infundibulopelvic ligament was double clamped above the ureter on the patient's right side. This pedicle was  suture ligated with 0 Vicryl.  The left tube and ovary were inspected and noted to have a normal appearance. Surgericel was placed over the posterior aspect of the uterus due to small vessels noted in the adhesions. All pedicles were reviewed and hemostasis was noted. The pelvis was irrigated coupiously.   All laparotomy sponges and instruments were removed from the abdomen. The peritoneum and rectus muscles were closed with a 2-0 Vicryl running stitch, and the fascia was also closed in a running fashion with 0 Vicryl suture. The subcutaneous layer was irrigated and injected with 30 ml of 0.5% Marcaine. The skin was closed with a 4-0 Vicryl subcuticular stitch. Sponge, lap, needle, and instrument counts were correct times two. The patient was taken to the recovery area awake, extubated and in stable condition.  Arlina Robes, MD, [place] Attending [place], [facility]

## 2020-08-19 ENCOUNTER — Encounter (HOSPITAL_COMMUNITY): Payer: Self-pay | Admitting: Obstetrics and Gynecology

## 2020-08-19 LAB — CBC
HCT: 34 % — ABNORMAL LOW (ref 36.0–46.0)
Hemoglobin: 11.9 g/dL — ABNORMAL LOW (ref 12.0–15.0)
MCH: 33.3 pg (ref 26.0–34.0)
MCHC: 35 g/dL (ref 30.0–36.0)
MCV: 95.2 fL (ref 80.0–100.0)
Platelets: 191 10*3/uL (ref 150–400)
RBC: 3.57 MIL/uL — ABNORMAL LOW (ref 3.87–5.11)
RDW: 13.2 % (ref 11.5–15.5)
WBC: 8.4 10*3/uL (ref 4.0–10.5)
nRBC: 0 % (ref 0.0–0.2)

## 2020-08-19 LAB — BASIC METABOLIC PANEL
Anion gap: 8 (ref 5–15)
BUN: 9 mg/dL (ref 6–20)
CO2: 21 mmol/L — ABNORMAL LOW (ref 22–32)
Calcium: 8.9 mg/dL (ref 8.9–10.3)
Chloride: 107 mmol/L (ref 98–111)
Creatinine, Ser: 0.92 mg/dL (ref 0.44–1.00)
GFR, Estimated: >60 mL/min (ref >60)
Glucose, Bld: 90 mg/dL (ref 70–99)
Potassium: 3.7 mmol/L (ref 3.5–5.1)
Sodium: 136 mmol/L (ref 135–145)

## 2020-08-19 NOTE — Progress Notes (Signed)
1 Day Post-Op Procedure(s) (LRB): LAPAROTOMY WITH RIGHT SALPINGO-OOPHERECTOMY (Right) DILATATION AND CURETTAGE (N/A)  Subjective: Ariana Young reports feeling sore this morning. Pain controlled, up to restroom without problems. Tolerating diet   Objective: I have reviewed patient's vital signs.   Lungs clear Heart RRR Abd soft + BS drsg intact Ext non tender  Assessment: s/p Procedure(s): LAPAROTOMY WITH RIGHT SALPINGO-OOPHERECTOMY (Right) DILATATION AND CURETTAGE (N/A): stable  Plan: Advance diet Encourage ambulation Discontinue IV fluids  LOS: 1 day    [name] 08/19/2020, 11:03 AM

## 2020-08-20 MED ORDER — OXYCODONE-ACETAMINOPHEN 5-325 MG PO TABS
1.0000 | ORAL_TABLET | ORAL | 0 refills | Status: DC | PRN
Start: 2020-08-20 — End: 2020-10-05

## 2020-08-20 MED ORDER — IBUPROFEN 800 MG PO TABS: 800.0000 mg | ORAL_TABLET | Freq: Three times a day (TID) | ORAL | 1 refills | Status: DC

## 2020-08-20 NOTE — Discharge Summary (Signed)
Physician Discharge Summary  Patient ID: Ariana Young MRN: [id number] DOB/AGE: [birth year] 35 y.o.  Admit date: 08/18/2020 Discharge date: 08/20/2020  Admission Diagnoses: Ovarian dermoids and endometrial polyp  Discharge Diagnoses:  Active Problems:   Post-operative state   Cyst of right ovary   Discharged Condition: good  Hospital Course: Ariana Young was admitted with above Dx. She underwent uterine D & C for her endometrial polyp and Exp Lap with RSO. See OP note for additional information. Post op course was unremarkable. Progressed to ambulating, voiding, passing flatus, tolerating diet and good oral pain control. On POD # 2 felt amendable for discharge home. Discharge instructions, medications and follow up reviewed with pt. Pt verbalized understanding.   Consults: None  Significant Diagnostic Studies: labs  Treatments: surgery: D & C and Exp Lap with RSO  Discharge Exam: Blood pressure 136/81, pulse (!) 54, temperature 98.2 F (36.8 C), temperature source Oral, resp. rate 18, height 5\' 6"  (1.676 m), weight 72.6 kg, last menstrual period 08/16/2020, SpO2 100 %. Lungs clear Heart RRR ABd soft + BS drsg intact GU deferred Ext non tender  Disposition: Discharge disposition: 01-Home or Self Care       Discharge Instructions    Call MD for:  difficulty breathing, headache or visual disturbances   Complete by: As directed    Call MD for:  extreme fatigue   Complete by: As directed    Call MD for:  hives   Complete by: As directed    Call MD for:  persistant dizziness or light-headedness   Complete by: As directed    Call MD for:  persistant nausea and vomiting   Complete by: As directed    Call MD for:  redness, tenderness, or signs of infection (pain, swelling, redness, odor or green/yellow discharge around incision site)   Complete by: As directed    Call MD for:  severe uncontrolled pain   Complete by: As directed    Call MD for:  temperature >100.4   Complete by:  As directed    Diet - low sodium heart healthy   Complete by: As directed    Discharge wound care:   Complete by: As directed    Remove dressing Sunday August 23 2020   Increase activity slowly   Complete by: As directed    Sexual Activity Restrictions   Complete by: As directed    Pelvic rest x 4 weeks     Allergies as of 08/20/2020      Reactions   Peanut-containing Drug Products Anaphylaxis, Hives   Peanuts [peanut Oil] Anaphylaxis, Hives      Medication List    STOP taking these medications   acetaminophen 500 MG tablet Commonly known as: TYLENOL   predniSONE 50 MG tablet Commonly known as: DELTASONE   triamcinolone 0.1 % Commonly known as: KENALOG     TAKE these medications   albuterol 108 (90 Base) MCG/ACT inhaler Commonly known as: VENTOLIN HFA Inhale 2 puffs into the lungs every 4 (four) hours as needed for wheezing or shortness of breath.   EPINEPHrine 0.3 mg/0.3 mL Soaj injection Commonly known as: EPI-PEN Inject 0.3 mg into the muscle once as needed (for allergic reaction).   fluticasone 50 MCG/ACT nasal spray Commonly known as: FLONASE Place 1 spray into both nostrils daily as needed for allergies or rhinitis.   hydrocortisone cream 1 % Apply 1 application topically daily as needed for itching (eczema).   ibuprofen 800 MG tablet Commonly known as: ADVIL Take  1 tablet (800 mg total) by mouth every 8 (eight) hours.   loratadine 10 MG tablet Commonly known as: CLARITIN Take 10 mg by mouth daily as needed for allergies.   oxyCODONE-acetaminophen 5-325 MG tablet Commonly known as: PERCOCET/ROXICET Take 1 tablet by mouth every 4 (four) hours as needed for moderate pain.            Discharge Care Instructions  (From admission, onward)         Start     Ordered   08/20/20 0000  Discharge wound care:       Comments: Remove dressing Sunday August 23 2020   08/20/20 [place] [facility] [place] [facility] In 4 weeks.   Specialty: Obstetrics and Gynecology Why: Post op appt with Dr. Gardiner Fanti Contact information: [place] [id number] [phone]              Signed: [name] 08/20/2020, 7:40 AM

## 2020-08-20 NOTE — Progress Notes (Signed)
At 1454 d/c instructions given, all questions answered, pt verbalized understanding. Pt states having some pain, however, states tolerable. Pt is ambulatory, however a wheelchair was used to go to car. Pt alert and oriented x4.

## 2020-08-20 NOTE — Plan of Care (Signed)

## 2020-08-24 LAB — SURGICAL PATHOLOGY

## 2020-09-17 ENCOUNTER — Ambulatory Visit: Payer: Medicaid Other | Admitting: Obstetrics and Gynecology

## 2020-09-17 ENCOUNTER — Other Ambulatory Visit: Payer: Self-pay | Admitting: Family Medicine

## 2020-09-17 DIAGNOSIS — R0602 Shortness of breath: Secondary | ICD-10-CM | POA: Diagnosis not present

## 2020-09-17 DIAGNOSIS — R000 Tachycardia, unspecified: Secondary | ICD-10-CM | POA: Diagnosis not present

## 2020-09-17 DIAGNOSIS — R0689 Other abnormalities of breathing: Secondary | ICD-10-CM | POA: Diagnosis not present

## 2020-09-17 NOTE — Telephone Encounter (Signed)
Requested medication (s) are due for refill today - yes  Requested medication (s) are on the active medication list -yes  Future visit scheduled -no  Last refill: 04/01/2020  Notes to clinic: Attempted to contact patient(appointment) -she states she has EMS on the way- she is having trouble breathing. No triage preformed patient is out of breath with EMS in route. Request sent for review.  Requested Prescriptions  Pending Prescriptions Disp Refills   albuterol (VENTOLIN HFA) 108 (90 Base) MCG/ACT inhaler [Pharmacy Med Name: ALBUTEROL HFA INH (200 PUFFS)8.5GM] 8.5 g 0    Sig: INHALE 2 PUFFS INTO THE LUNGS EVERY 4 HOURS AS NEEDED FOR WHEEZING OR SHORTNESS OF BREATH      Pulmonology:  Beta Agonists Failed - 09/17/2020 12:24 PM      Failed - One inhaler should last at least one month. If the patient is requesting refills earlier, contact the patient to check for uncontrolled symptoms.      Failed - Valid encounter within last 12 months    Recent Outpatient Visits           1 year ago Other [place], Pierre Part, MD   1 year ago Mild intermittent asthma without complication   [place], Charlane Ferretti, MD                    Requested Prescriptions  Pending Prescriptions Disp Refills   albuterol (VENTOLIN HFA) 108 (90 Base) MCG/ACT inhaler [Pharmacy Med Name: ALBUTEROL HFA INH (200 PUFFS)8.5GM] 8.5 g 0    Sig: INHALE 2 PUFFS INTO THE LUNGS EVERY 4 HOURS AS NEEDED FOR WHEEZING OR SHORTNESS OF BREATH      Pulmonology:  Beta Agonists Failed - 09/17/2020 12:24 PM      Failed - One inhaler should last at least one month. If the patient is requesting refills earlier, contact the patient to check for uncontrolled symptoms.      Failed - Valid encounter within last 12 months    Recent Outpatient Visits           1 year ago Other [place], Charlane Ferretti, MD   1  year ago Mild intermittent asthma without complication   [facility] Charlott Rakes, MD

## 2020-10-05 ENCOUNTER — Encounter: Payer: Self-pay | Admitting: Family Medicine

## 2020-10-05 ENCOUNTER — Encounter: Payer: Self-pay | Admitting: Obstetrics and Gynecology

## 2020-10-05 ENCOUNTER — Other Ambulatory Visit: Payer: Self-pay

## 2020-10-05 ENCOUNTER — Ambulatory Visit (INDEPENDENT_AMBULATORY_CARE_PROVIDER_SITE_OTHER): Payer: Medicaid Other | Admitting: Obstetrics and Gynecology

## 2020-10-05 ENCOUNTER — Other Ambulatory Visit (HOSPITAL_COMMUNITY)
Admission: RE | Admit: 2020-10-05 | Discharge: 2020-10-05 | Disposition: A | Discharge status: Home / Self Care | Payer: Medicaid Other | Source: Ambulatory Visit | Attending: Obstetrics and Gynecology | Admitting: Obstetrics and Gynecology

## 2020-10-05 VITALS — BP 129/84 | HR 82 | Ht 67.0 in | Wt 155.1 lb

## 2020-10-05 DIAGNOSIS — N898 Other specified noninflammatory disorders of vagina: Secondary | ICD-10-CM

## 2020-10-05 DIAGNOSIS — Z9889 Other specified postprocedural states: Secondary | ICD-10-CM

## 2020-10-05 NOTE — Progress Notes (Signed)
Ariana Young presents for post op appt. S/P D & C for endometrial polyp and Ex Lap for ovarian dermoid. Pathology reviewed with pt Pt doing well except some vaginal discharge Denies any bowel or bladder dysfunction Has had a cycle since surgery  PE AF VSS Lungs clear Heart RRR Abd soft + BS incision well healed GU vaginal swab collected  A/P Post OP state  Vaginal swab collected. Tx as per results. Return to work note provided. Increased ADL's as tolerates.  F/U PRN

## 2020-10-05 NOTE — Addendum Note (Signed)
Addended by: Bethanne Ginger on: 10/05/2020 04:10 PM   Modules accepted: Orders

## 2020-10-05 NOTE — Patient Instructions (Signed)
Health Maintenance, Female Adopting a healthy lifestyle and getting preventive care are important in promoting health and wellness. Ask your health care provider about:  The right schedule for you to have regular tests and exams.  Things you can do on your own to prevent diseases and keep yourself healthy. What should I know about diet, weight, and exercise? Eat a healthy diet  Eat a diet that includes plenty of vegetables, fruits, low-fat dairy products, and lean protein.  Do not eat a lot of foods that are high in solid fats, added sugars, or sodium.   Maintain a healthy weight Body mass index (BMI) is used to identify weight problems. It estimates body fat based on height and weight. Your health care provider can help determine your BMI and help you achieve or maintain a healthy weight. Get regular exercise Get regular exercise. This is one of the most important things you can do for your health. Most adults should:  Exercise for at least 150 minutes each week. The exercise should increase your heart rate and make you sweat (moderate-intensity exercise).  Do strengthening exercises at least twice a week. This is in addition to the moderate-intensity exercise.  Spend less time sitting. Even light physical activity can be beneficial. Watch cholesterol and blood lipids Have your blood tested for lipids and cholesterol at 34 years of age, then have this test every 5 years. Have your cholesterol levels checked more often if:  Your lipid or cholesterol levels are high.  You are older than 34 years of age.  You are at high risk for heart disease. What should I know about cancer screening? Depending on your health history and family history, you may need to have cancer screening at various ages. This may include screening for:  Breast cancer.  Cervical cancer.  Colorectal cancer.  Skin cancer.  Lung cancer. What should I know about heart disease, diabetes, and high blood  pressure? Blood pressure and heart disease  High blood pressure causes heart disease and increases the risk of stroke. This is more likely to develop in people who have high blood pressure readings, are of African descent, or are overweight.  Have your blood pressure checked: ? Every 3-5 years if you are 18-39 years of age. ? Every year if you are 40 years old or older. Diabetes Have regular diabetes screenings. This checks your fasting blood sugar level. Have the screening done:  Once every three years after age 40 if you are at a normal weight and have a low risk for diabetes.  More often and at a younger age if you are overweight or have a high risk for diabetes. What should I know about preventing infection? Hepatitis B If you have a higher risk for hepatitis B, you should be screened for this virus. Talk with your health care provider to find out if you are at risk for hepatitis B infection. Hepatitis C Testing is recommended for:  Everyone born from 1945 through 1965.  Anyone with known risk factors for hepatitis C. Sexually transmitted infections (STIs)  Get screened for STIs, including gonorrhea and chlamydia, if: ? You are sexually active and are younger than 34 years of age. ? You are older than 34 years of age and your health care provider tells you that you are at risk for this type of infection. ? Your sexual activity has changed since you were last screened, and you are at increased risk for chlamydia or gonorrhea. Ask your health care provider   if you are at risk.  Ask your health care provider about whether you are at high risk for HIV. Your health care provider may recommend a prescription medicine to help prevent HIV infection. If you choose to take medicine to prevent HIV, you should first get tested for HIV. You should then be tested every 3 months for as long as you are taking the medicine. Pregnancy  If you are about to stop having your period (premenopausal) and  you may become pregnant, seek counseling before you get pregnant.  Take 400 to 800 micrograms (mcg) of folic acid every day if you become pregnant.  Ask for birth control (contraception) if you want to prevent pregnancy. Osteoporosis and menopause Osteoporosis is a disease in which the bones lose minerals and strength with aging. This can result in bone fractures. If you are 65 years old or older, or if you are at risk for osteoporosis and fractures, ask your health care provider if you should:  Be screened for bone loss.  Take a calcium or vitamin D supplement to lower your risk of fractures.  Be given hormone replacement therapy (HRT) to treat symptoms of menopause. Follow these instructions at home: Lifestyle  Do not use any products that contain nicotine or tobacco, such as cigarettes, e-cigarettes, and chewing tobacco. If you need help quitting, ask your health care provider.  Do not use street drugs.  Do not share needles.  Ask your health care provider for help if you need support or information about quitting drugs. Alcohol use  Do not drink alcohol if: ? Your health care provider tells you not to drink. ? You are pregnant, may be pregnant, or are planning to become pregnant.  If you drink alcohol: ? Limit how much you use to 0-1 drink a day. ? Limit intake if you are breastfeeding.  Be aware of how much alcohol is in your drink. In the [place] one drink equals one 12 oz bottle of beer (355 mL), one 5 oz glass of wine (148 mL), or one 1 oz glass of hard liquor (44 mL). General instructions  Schedule regular health, dental, and eye exams.  Stay current with your vaccines.  Tell your health care provider if: ? You often feel depressed. ? You have ever been abused or do not feel safe at home. Summary  Adopting a healthy lifestyle and getting preventive care are important in promoting health and wellness.  Follow your health care provider's instructions about healthy  diet, exercising, and getting tested or screened for diseases.  Follow your health care provider's instructions on monitoring your cholesterol and blood pressure. This information is not intended to replace advice given to you by your health care provider. Make sure you discuss any questions you have with your health care provider. Document Revised: 05/02/2018 Document Reviewed: 05/02/2018 Elsevier Patient Education  2021 Elsevier Inc.  

## 2020-10-06 LAB — CERVICOVAGINAL ANCILLARY ONLY
Bacterial Vaginitis (gardnerella): NEGATIVE
Candida Glabrata: NEGATIVE
Candida Vaginitis: POSITIVE — AB
Chlamydia: NEGATIVE
Comment: NEGATIVE
Comment: NEGATIVE
Comment: NEGATIVE
Comment: NEGATIVE
Comment: NEGATIVE
Comment: NORMAL
Neisseria Gonorrhea: NEGATIVE
Trichomonas: NEGATIVE

## 2020-10-07 ENCOUNTER — Other Ambulatory Visit: Payer: Self-pay

## 2020-10-07 DIAGNOSIS — B379 Candidiasis, unspecified: Secondary | ICD-10-CM

## 2020-10-07 MED ORDER — FLUCONAZOLE 150 MG PO TABS: 150.0000 mg | ORAL_TABLET | Freq: Once | ORAL | 0 refills | Status: AC

## 2020-10-07 NOTE — Progress Notes (Signed)
[name], MD  P [facility] Clinical Pool Please send in Rx for yeast infection  Diflucan 150 mg po now and repeat in 3 days  Pt is aware.   Thanks  Ariana Young

## 2020-10-26 ENCOUNTER — Emergency Department (HOSPITAL_COMMUNITY)
Admission: EM | Admit: 2020-10-26 | Discharge: 2020-10-26 | Disposition: A | Discharge status: Home / Self Care | Payer: Medicaid Other | Attending: Emergency Medicine | Admitting: Emergency Medicine

## 2020-10-26 ENCOUNTER — Emergency Department (HOSPITAL_COMMUNITY): Payer: Medicaid Other

## 2020-10-26 ENCOUNTER — Encounter (HOSPITAL_COMMUNITY): Payer: Self-pay | Admitting: Emergency Medicine

## 2020-10-26 DIAGNOSIS — R1031 Right lower quadrant pain: Secondary | ICD-10-CM | POA: Diagnosis present

## 2020-10-26 DIAGNOSIS — R112 Nausea with vomiting, unspecified: Secondary | ICD-10-CM | POA: Diagnosis not present

## 2020-10-26 DIAGNOSIS — R5383 Other fatigue: Secondary | ICD-10-CM | POA: Diagnosis not present

## 2020-10-26 DIAGNOSIS — Z5321 Procedure and treatment not carried out due to patient leaving prior to being seen by health care provider: Secondary | ICD-10-CM | POA: Insufficient documentation

## 2020-10-26 DIAGNOSIS — R630 Anorexia: Secondary | ICD-10-CM | POA: Insufficient documentation

## 2020-10-26 DIAGNOSIS — I10 Essential (primary) hypertension: Secondary | ICD-10-CM | POA: Diagnosis not present

## 2020-10-26 DIAGNOSIS — N200 Calculus of kidney: Secondary | ICD-10-CM | POA: Diagnosis not present

## 2020-10-26 LAB — I-STAT BETA HCG BLOOD, ED (MC, WL, AP ONLY): I-stat hCG, quantitative: <5 m[IU]/mL (ref <5)

## 2020-10-26 NOTE — ED Triage Notes (Signed)
Per EMS-abdominal pain since this am-stabbing-vomiting-4 mg of Zofran, 50 mcg of Fentanyl given in route

## 2020-10-26 NOTE — ED Provider Notes (Signed)
Emergency Medicine Provider Triage Evaluation Note  Ariana Young , a 35 y.o. female  was evaluated in triage.  Pt complains of patient complains of nausea, vomiting, worsening right lower quadrant abdominal pain with decreased appetite and fatigue.  She states that her symptoms began this morning when she first woke up around 7 AM.  She states that she has a history of ovarian cysts and had 1 removed in April of this year.  She states that this feels different however.  She denies any vaginal discharge or urinary frequency urgency or dysuria.  Denies any surgeries apart from surgery to remove an ovarian cyst.  Review of Systems  Positive: Nausea, vomiting, anorexia, right lower quadrant abdominal pain Negative: Fevers  Physical Exam  BP 105/79 (BP Location: Left Arm)   Pulse 62   Temp 98.8 F (37.1 C) (Oral)   Resp 16   LMP 10/19/2020   SpO2 99%  Gen:   Awake, no distress  Resp:  Normal effort  MSK:   Moves extremities without difficulty  Other:  Abdomen is soft, there is tenderness to palpation of the right lower quadrant and right upper quadrant.  Murphy sign is negative.  Rovsing's negative.  Medical Decision Making  Medically screening exam initiated at 5:09 PM.  Appropriate orders placed.  Katrina Daddona was informed that the remainder of the evaluation will be completed by another provider, this initial triage assessment does not replace that evaluation, and the importance of remaining in the ED until their evaluation is complete.  Patient evaluated by myself in triage.  Given patient's right lower quadrant abdominal pain nausea and decreased appetite with some right lower quadrant tenderness will obtain CT scan without contrast to evaluate for appendicitis although I feel this timing is a bit sudden onset to be appendicitis.  She does have some right upper quadrant tenderness which very well may be from her heaving from her vomiting.  Low suspicion for biliary disease.  Lab work  obtained.  Patient will wait in ER for results.  I encouraged her to stay and not to elope.  She is agreeable to this.  Feels much improved after some Zofran and fentanyl that was given by EMS.   Pati Gallo [place], [place] 10/26/20 1712    Isla Pence, MD 10/26/20 1742

## 2021-03-30 ENCOUNTER — Other Ambulatory Visit: Payer: Self-pay

## 2021-03-30 ENCOUNTER — Ambulatory Visit: Payer: Medicaid Other | Attending: Family Medicine | Admitting: Family Medicine

## 2021-03-30 ENCOUNTER — Telehealth: Payer: Self-pay | Admitting: Family Medicine

## 2021-03-30 ENCOUNTER — Encounter: Payer: Self-pay | Admitting: Family Medicine

## 2021-03-30 VITALS — BP 137/96 | HR 85 | Ht 66.0 in | Wt 156.8 lb

## 2021-03-30 DIAGNOSIS — Z0001 Encounter for general adult medical examination with abnormal findings: Secondary | ICD-10-CM

## 2021-03-30 DIAGNOSIS — R030 Elevated blood-pressure reading, without diagnosis of hypertension: Secondary | ICD-10-CM

## 2021-03-30 DIAGNOSIS — J4520 Mild intermittent asthma, uncomplicated: Secondary | ICD-10-CM

## 2021-03-30 DIAGNOSIS — B3731 Acute candidiasis of vulva and vagina: Secondary | ICD-10-CM

## 2021-03-30 DIAGNOSIS — Z13228 Encounter for screening for other metabolic disorders: Secondary | ICD-10-CM

## 2021-03-30 DIAGNOSIS — Z0000 Encounter for general adult medical examination without abnormal findings: Secondary | ICD-10-CM

## 2021-03-30 DIAGNOSIS — Z1159 Encounter for screening for other viral diseases: Secondary | ICD-10-CM

## 2021-03-30 MED ORDER — FLUCONAZOLE 150 MG PO TABS: 150.0000 mg | ORAL_TABLET | Freq: Once | ORAL | 5 refills | Status: AC

## 2021-03-30 MED ORDER — ALBUTEROL SULFATE HFA 108 (90 BASE) MCG/ACT IN AERS: 2.0000 | INHALATION_SPRAY | RESPIRATORY_TRACT | 6 refills | Status: AC | PRN

## 2021-03-30 NOTE — Progress Notes (Signed)
CPE Cough for 2 days Hit head 1 week ago still having pain in front ride side.

## 2021-03-30 NOTE — Patient Instructions (Signed)

## 2021-03-30 NOTE — Telephone Encounter (Signed)
   Ariana Young DOB: 06-06-1985 MRN: [id number]   RIDER WAIVER AND RELEASE OF LIABILITY  For purposes of improving physical access to our facilities, [facility] is pleased to partner with third parties to provide Ariana Young patients or other authorized individuals the option of convenient, on-demand ground transportation services (the [place]") through use of the technology service that enables users to request on-demand ground transportation from independent third-party providers.  By opting to use and accept these Ariana Young, I, the undersigned, hereby agree on behalf of myself, and on behalf of any minor child using the Ariana Young for whom I am the parent or legal guardian, as follows:  Ariana Young provided to me are provided by independent third-party transportation providers who are not Ariana Young or employees and who are unaffiliated with Ariana Young. Ariana Young is neither a transportation carrier nor a common or public carrier. Ariana Young has no control over the quality or safety of the transportation that occurs as a result of the Ariana Young. Ariana Young cannot guarantee that any third-party transportation provider will complete any arranged transportation service. Ariana Young makes no representation, warranty, or guarantee regarding the reliability, timeliness, quality, safety, suitability, or availability of any of the Transport Services or that they will be error free. I fully understand that traveling by vehicle involves risks and dangers of serious bodily injury, including permanent disability, paralysis, and death. I agree, on behalf of myself and on behalf of any minor child using the Transport Services for whom I am the parent or legal guardian, that the entire risk arising out of my use of the Ariana Young remains solely with me, to the maximum extent permitted under applicable law. The Ariana Young are provided "as is"  and "as available." Ariana Young disclaims all representations and warranties, express, implied or statutory, not expressly set out in these terms, including the implied warranties of merchantability and fitness for a particular purpose. I hereby waive and release Ariana Young, its agents, employees, officers, directors, representatives, insurers, attorneys, assigns, successors, subsidiaries, and affiliates from any and all past, present, or future claims, demands, liabilities, actions, causes of action, or suits of any kind directly or indirectly arising from acceptance and use of the Ariana Young. I further waive and release  and its affiliates from all present and future liability and responsibility for any injury or death to persons or damages to property caused by or related to the use of the Ariana Young. I have read this Waiver and Release of Liability, and I understand the terms used in it and their legal significance. This Waiver is freely and voluntarily given with the understanding that my right (as well as the right of any minor child for whom I am the parent or legal guardian using the Ariana Young) to legal recourse against  in connection with the Ariana Young is knowingly surrendered in return for use of these services.   I attest that I read the consent document to Ariana Young, gave Ariana Young the opportunity to ask questions and answered the questions asked (if any). I affirm that Ariana Young then provided consent for she's participation in this program.     Ariana Young

## 2021-03-30 NOTE — Progress Notes (Signed)
Subjective:  Patient ID: Ariana Young, female    DOB: [birth year]  Age: 35 y.o. MRN: [id number]  CC: Annual Exam   HPI Ariana Young is a 35 y.o. year old female with a history of asthma who presents today for complete physical exam. Last Pap smear was performed by her OB/GYN in 06/2020 and revealed NILM STD screen was negative. Interval History: She hit her head on her R temple a couple of days ago and she had been having headaches but these have resolved now. She has been having a lot of yeast infection and notices it a few days prior to her periods. Blood pressure is elevated today and she attributes this to stress. Her asthma has been stable. 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   Eczema     Past Surgical History:  Procedure Laterality Date   DILATION AND CURETTAGE OF UTERUS N/A 08/18/2020   Procedure: DILATATION AND CURETTAGE;  Surgeon: [name], MD;  Location: [facility];  Service: Gynecology;  Laterality: N/A;   LAPAROTOMY Right 08/18/2020   Procedure: LAPAROTOMY WITH RIGHT SALPINGO-OOPHERECTOMY;  Surgeon: [name], MD;  Location: [facility];  Service: Gynecology;  Laterality: Right;   OVARIAN CYST SURGERY  2008   ELAP, removal of bilateral dermoid cysts   WISDOM TOOTH EXTRACTION      Family History  Problem Relation Age of Onset   Diabetes Mother    Hypertension Mother    Heart disease Mother     Allergies  Allergen Reactions   Peanut-Containing Drug Products Anaphylaxis and Hives   Peanuts [Peanut Oil] Anaphylaxis and Hives    Outpatient Medications Prior to Visit  Medication Sig Dispense Refill   EPINEPHrine 0.3 mg/0.3 mL IJ SOAJ injection Inject 0.3 mg into the muscle once as needed (for allergic reaction).      hydrocortisone cream 1 % Apply 1 application topically daily as needed for itching (eczema).     ibuprofen (ADVIL) 800 MG tablet Take 1 tablet (800 mg total) by mouth  every 8 (eight) hours. 30 tablet 1   albuterol (VENTOLIN HFA) 108 (90 Base) MCG/ACT inhaler Inhale 2 puffs into the lungs every 4 (four) hours as needed for wheezing or shortness of breath. 8.5 g 0   fluticasone (FLONASE) 50 MCG/ACT nasal spray Place 1 spray into both nostrils daily as needed for allergies or rhinitis. (Patient not taking: Reported on 03/30/2021)     loratadine (CLARITIN) 10 MG tablet Take 10 mg by mouth daily as needed for allergies. (Patient not taking: Reported on 03/30/2021)     No facility-administered medications prior to visit.     ROS Review of Systems  Constitutional:  Negative for activity change, appetite change and fatigue.  HENT:  Negative for congestion, sinus pressure and sore throat.   Eyes:  Negative for visual disturbance.  Respiratory:  Negative for cough, chest tightness, shortness of breath and wheezing.   Cardiovascular:  Negative for chest pain and palpitations.  Gastrointestinal:  Negative for abdominal distention, abdominal pain and constipation.  Endocrine: Negative for polydipsia.  Genitourinary:  Negative for dysuria and frequency.  Musculoskeletal:  Negative for arthralgias and back pain.  Skin:  Negative for rash.  Neurological:  Negative for tremors, light-headedness and numbness.  Hematological:  Does not bruise/bleed easily.  Psychiatric/Behavioral:  Negative for agitation and behavioral problems.    Objective:  BP (!) 137/96  Pulse 85   Ht 5\' 6"  (1.676 m)   Wt 156 lb 12.8 oz (71.1 kg)   SpO2 100%   BMI 25.31 kg/m   BP/Weight 03/30/2021 10/26/2020 5/68/1275  Systolic BP 170 017 494  Diastolic BP 96 83 84  Wt. (Lbs) 156.8 - 155.1  BMI 25.31 - 24.29      Physical Exam Constitutional:      General: She is not in acute distress.    Appearance: She is well-developed. She is not diaphoretic.  HENT:     Head: Normocephalic.     Right Ear: External ear normal.     Left Ear: External ear normal.     Nose: Nose normal.      Mouth/Throat:     Mouth: Mucous membranes are moist.  Eyes:     Extraocular Movements: Extraocular movements intact.     Conjunctiva/sclera: Conjunctivae normal.     Pupils: Pupils are equal, round, and reactive to light.  Neck:     Vascular: No JVD.  Cardiovascular:     Rate and Rhythm: Normal rate and regular rhythm.     Pulses: Normal pulses.     Heart sounds: Normal heart sounds. No murmur heard.   No gallop.  Pulmonary:     Effort: Pulmonary effort is normal. No respiratory distress.     Breath sounds: Normal breath sounds. No wheezing or rales.  Chest:     Chest wall: No tenderness.  Breasts:    Right: No mass or tenderness.     Left: No mass or tenderness.  Abdominal:     General: Bowel sounds are normal. There is no distension.     Palpations: Abdomen is soft. There is no mass.     Tenderness: There is no abdominal tenderness.  Musculoskeletal:        General: No tenderness. Normal range of motion.     Cervical back: Normal range of motion.  Skin:    General: Skin is warm and dry.  Neurological:     Mental Status: She is alert and oriented to person, place, and time.     Deep Tendon Reflexes: Reflexes are normal and symmetric.  Psychiatric:        Mood and Affect: Mood normal.    CMP Latest Ref Rng & Units 08/19/2020 08/18/2020 02/27/2020  Glucose 70 - 99 mg/dL 90 89 89  BUN 6 - 20 mg/dL 9 6 9   Creatinine 0.44 - 1.00 mg/dL 0.92 0.90 0.76  Sodium 135 - 145 mmol/L 136 138 141  Potassium 3.5 - 5.1 mmol/L 3.7 5.0 3.9  Chloride 98 - 111 mmol/L 107 110 113(H)  CO2 22 - 32 mmol/L 21(L) 24 19(L)  Calcium 8.9 - 10.3 mg/dL 8.9 8.6(L) 8.8(L)  Total Protein 6.5 - 8.1 g/dL - - 6.8  Total Bilirubin 0.3 - 1.2 mg/dL - - 0.6  Alkaline Phos 38 - 126 U/L - - 49  AST 15 - 41 U/L - - 17  ALT 0 - 44 U/L - - 11    Lipid Panel  No results found for: CHOL, TRIG, HDL, CHOLHDL, VLDL, LDLCALC, LDLDIRECT  CBC    Component Value Date/Time   WBC 8.4 08/19/2020 0700   RBC 3.57 (L)  08/19/2020 0700   HGB 11.9 (L) 08/19/2020 0700   HGB 10.8 07/01/2014 0000   HCT 34.0 (L) 08/19/2020 0700   HCT 31 07/01/2014 0000   PLT 191 08/19/2020 0700   PLT 201 07/01/2014 0000   MCV 95.2 08/19/2020  0700   MCH 33.3 08/19/2020 0700   MCHC 35.0 08/19/2020 0700   RDW 13.2 08/19/2020 0700   LYMPHSABS 0.6 (L) 02/27/2020 1530   MONOABS 0.1 02/27/2020 1530   EOSABS 0.0 02/27/2020 1530   BASOSABS 0.0 02/27/2020 1530    No results found for: HGBA1C  Assessment & Plan:  1. Annual physical exam Counseled on 150 minutes of exercise per week, healthy eating (including decreased daily intake of saturated fats, cholesterol, added sugars, sodium),routine healthcare maintenance.   2. Elevated blood pressure reading She attributes this to stress Advised to work on lifestyle modification and we will monitor this Counseled on blood pressure goal of less than 130/80, low-sodium, DASH diet, medication compliance, 150 minutes of moderate intensity exercise per week. Discussed medication compliance, adverse effects.  3. Screening for metabolic disorder - LP+Non-HDL Cholesterol; Future - Hemoglobin A1c; Future  4. Need for hepatitis C screening test - HCV Ab w Reflex to Quant PCR; Future  5. Vaginal candidiasis Will provide enough refills as she tends to have this prior to her periods - fluconazole (DIFLUCAN) 150 MG tablet; Take 1 tablet (150 mg total) by mouth once for 1 dose.  Dispense: 2 tablet; Refill: 5  6. Mild intermittent asthma without complication Stable - albuterol (VENTOLIN HFA) 108 (90 Base) MCG/ACT inhaler; Inhale 2 puffs into the lungs every 4 (four) hours as needed for wheezing or shortness of breath.  Dispense: 8.5 g; Refill: 6    Meds ordered this encounter  Medications   albuterol (VENTOLIN HFA) 108 (90 Base) MCG/ACT inhaler    Sig: Inhale 2 puffs into the lungs every 4 (four) hours as needed for wheezing or shortness of breath.    Dispense:  8.5 g    Refill:  6    fluconazole (DIFLUCAN) 150 MG tablet    Sig: Take 1 tablet (150 mg total) by mouth once for 1 dose.    Dispense:  2 tablet    Refill:  5    Follow-up: Return in about 1 year (around 03/30/2022) for Complete physical exam.       Charlott Rakes, MD, FAAFP. [facility] [place], [place]   03/30/2021, 4:05 PM

## 2021-04-01 ENCOUNTER — Other Ambulatory Visit: Payer: Medicaid Other

## 2021-04-08 ENCOUNTER — Other Ambulatory Visit: Payer: Self-pay

## 2021-04-08 ENCOUNTER — Ambulatory Visit: Payer: Medicaid Other | Attending: Family Medicine

## 2021-04-08 DIAGNOSIS — Z1159 Encounter for screening for other viral diseases: Secondary | ICD-10-CM

## 2021-04-08 DIAGNOSIS — Z13228 Encounter for screening for other metabolic disorders: Secondary | ICD-10-CM | POA: Diagnosis not present

## 2021-04-09 LAB — LP+NON-HDL CHOLESTEROL
Cholesterol, Total: 130 mg/dL (ref 100–199)
HDL: 55 mg/dL (ref >39)
LDL Chol Calc (NIH): 66 mg/dL (ref 0–99)
Total Non-HDL-Chol (LDL+VLDL): 75 mg/dL (ref 0–129)
Triglycerides: 31 mg/dL (ref 0–149)
VLDL Cholesterol Cal: 9 mg/dL (ref 5–40)

## 2021-04-09 LAB — HCV INTERPRETATION

## 2021-04-09 LAB — HEMOGLOBIN A1C
Est. average glucose Bld gHb Est-mCnc: 105 mg/dL
Hgb A1c MFr Bld: 5.3 % (ref 4.8–5.6)

## 2021-04-09 LAB — HCV AB W REFLEX TO QUANT PCR: HCV Ab: <0.1 s/co ratio (ref 0.0–0.9)

## 2021-07-15 ENCOUNTER — Ambulatory Visit: Payer: Self-pay

## 2021-07-15 NOTE — Telephone Encounter (Signed)
Call placed to patient and VM was left informing patient to contact office to get placed on Mobile unit schedule for next week.

## 2021-07-15 NOTE — Telephone Encounter (Signed)
° °  Chief Complaint: Abdominal pain and lump Symptoms: pain and lump Frequency: intermittent pain increasing in intensity Pertinent Negatives: Patient denies fever Disposition: [] ED /[x] Urgent Care (no appt availability in office) / [] Appointment(In office/virtual)/ []  [place] [facility]/ [] Home Care/ [] Refused Recommended Disposition /[] [place] [facility]/ []  Follow-up with PCP Additional Notes: Unable to secure same day appt. Unable to get appt with ob/gyn. Pt will got to ED/UC.    Reason for Disposition  [1] MILD-MODERATE pain AND [2] constant AND [3] present > 2 hours  Answer Assessment - Initial Assessment Questions 1. LOCATION: "Where does it hurt?"      Right side abdomin 2. RADIATION: "Does the pain shoot anywhere else?" (e.g., chest, back)     no 3. ONSET: "When did the pain begin?" (e.g., minutes, hours or days ago)      gradual 4. SUDDEN: "Gradual or sudden onset?"     gradual 5. PATTERN "Does the pain come and go, or is it constant?"    - If constant: "Is it getting better, staying the same, or worsening?"      (Note: Constant means the pain never goes away completely; most serious pain is constant and it progresses)     - If intermittent: "How long does it last?" "Do you have pain now?"     (Note: Intermittent means the pain goes away completely between bouts)     intermittent 6. SEVERITY: "How bad is the pain?"  (e.g., Scale 1-10; mild, moderate, or severe)   - MILD (1-3): doesn't interfere with normal activities, abdomen soft and not tender to touch    - MODERATE (4-7): interferes with normal activities or awakens from sleep, abdomen tender to touch    - SEVERE (8-10): excruciating pain, doubled over, unable to do any normal activities      8-9/10 7. RECURRENT SYMPTOM: "Have you ever had this type of stomach pain before?" If Yes, ask: "When was the last time?" and "What happened that time?"      no 8. CAUSE: "What do you think is causing the stomach pain?"      unsure 9. RELIEVING/AGGRAVATING FACTORS: "What makes it better or worse?" (e.g., movement, antacids, bowel movement)     Has tried NSAIDs - little to no help. Movement (swaying helps 10. OTHER SYMPTOMS: "Do you have any other symptoms?" (e.g., back pain, diarrhea, fever, urination pain, vomiting)       Lump 11. PREGNANCY: "Is there any chance you are pregnant?" "When was your last menstrual period?" now  Protocols used: Abdominal Pain - [facility]

## 2021-09-16 ENCOUNTER — Ambulatory Visit: Payer: Medicaid Other | Admitting: Obstetrics and Gynecology

## 2022-01-17 ENCOUNTER — Other Ambulatory Visit (HOSPITAL_COMMUNITY)
Admission: RE | Admit: 2022-01-17 | Discharge: 2022-01-17 | Disposition: A | Discharge status: Home / Self Care | Payer: Self-pay | Source: Ambulatory Visit | Attending: Obstetrics and Gynecology | Admitting: Obstetrics and Gynecology

## 2022-01-17 ENCOUNTER — Other Ambulatory Visit: Payer: Self-pay

## 2022-01-17 ENCOUNTER — Encounter: Payer: Self-pay | Admitting: Obstetrics and Gynecology

## 2022-01-17 ENCOUNTER — Ambulatory Visit (INDEPENDENT_AMBULATORY_CARE_PROVIDER_SITE_OTHER): Payer: Medicaid Other | Admitting: Obstetrics and Gynecology

## 2022-01-17 VITALS — BP 133/84 | HR 81 | Wt 148.0 lb

## 2022-01-17 DIAGNOSIS — B379 Candidiasis, unspecified: Secondary | ICD-10-CM

## 2022-01-17 DIAGNOSIS — Z90721 Acquired absence of ovaries, unilateral: Secondary | ICD-10-CM | POA: Insufficient documentation

## 2022-01-17 DIAGNOSIS — N898 Other specified noninflammatory disorders of vagina: Secondary | ICD-10-CM | POA: Insufficient documentation

## 2022-01-17 NOTE — Progress Notes (Signed)
Ariana Young presents with c/o vaginal discharge. No new sexual partners. No new exposures as well.  Denies any bowel or bladder dysfunction Cycles normal Pap smear UTD  PE AF VSS Lungs clear Heart RRR Abd soft + BS GU self swab collected by pt  A/P Vaginal discharge  Self swab collected. Will treat as per results. Declined blood STD testing

## 2022-01-17 NOTE — Patient Instructions (Signed)

## 2022-01-18 LAB — CERVICOVAGINAL ANCILLARY ONLY
Bacterial Vaginitis (gardnerella): POSITIVE — AB
Candida Glabrata: NEGATIVE
Candida Vaginitis: NEGATIVE
Chlamydia: NEGATIVE
Comment: NEGATIVE
Comment: NEGATIVE
Comment: NEGATIVE
Comment: NEGATIVE
Comment: NEGATIVE
Comment: NORMAL
Neisseria Gonorrhea: NEGATIVE
Trichomonas: NEGATIVE

## 2022-01-20 ENCOUNTER — Telehealth: Payer: Self-pay | Admitting: *Deleted

## 2022-01-20 MED ORDER — METRONIDAZOLE 500 MG PO TABS: 500.0000 mg | ORAL_TABLET | Freq: Two times a day (BID) | ORAL | 0 refills | Status: DC

## 2022-01-20 NOTE — Telephone Encounter (Addendum)
-----   Message from [name], MD sent at 01/19/2022  3:10 PM EDT ----- Please send in Rx for BV and let pt know. Thanks Ariana Young   8/31  1105  Called pt and informed her of test result showing BV present. Pt stated that she has had this problem in the past and had no questions. Rx has been sent to her pharmacy and dosage instructions provided. She voiced understanding of all information and instructions given.

## 2022-04-04 IMAGING — CT CT ABD-PELV W/O CM
2 of 4 series · 16 of 46 positions shown, 18 images · non-contrast
Comparison: None.

CLINICAL DATA: Right lower quadrant pain.

EXAM:
CT ABDOMEN AND PELVIS WITHOUT CONTRAST
TECHNIQUE: Multidetector CT imaging of the abdomen and pelvis was performed
following the standard protocol without IV contrast.

[Series 2: axial st · axial · 0.71mm/px · z∈[+1090,+1490]mm · 13 of 90 slices shown, 15 images]
[im 5/90  soft-tissue]
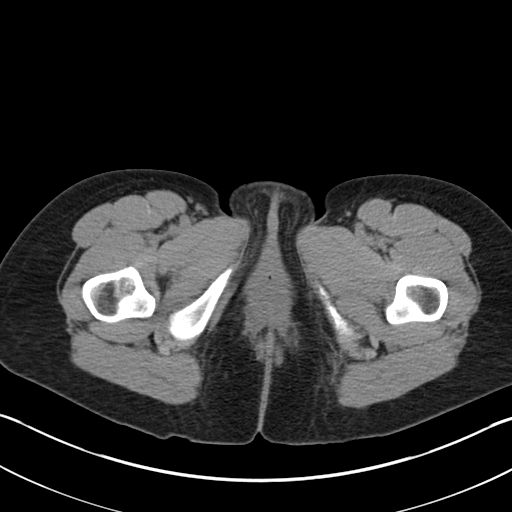
[im 5/90  bone]
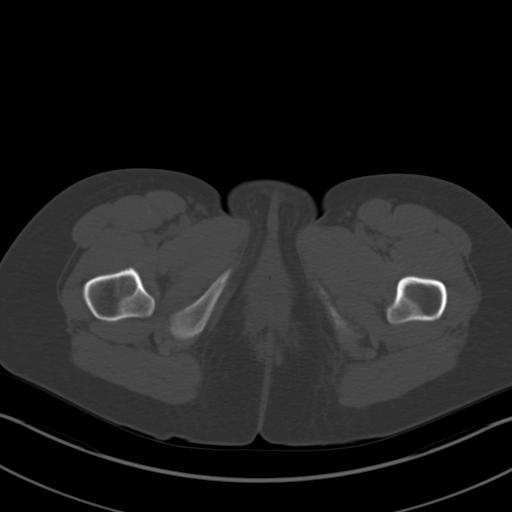
[im 10/90  soft-tissue]
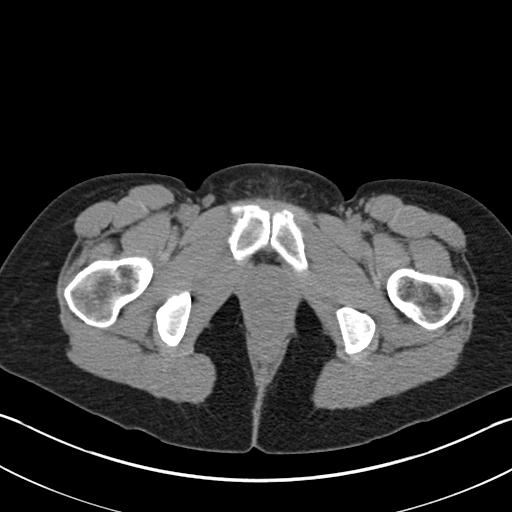
[im 20/90  soft-tissue]
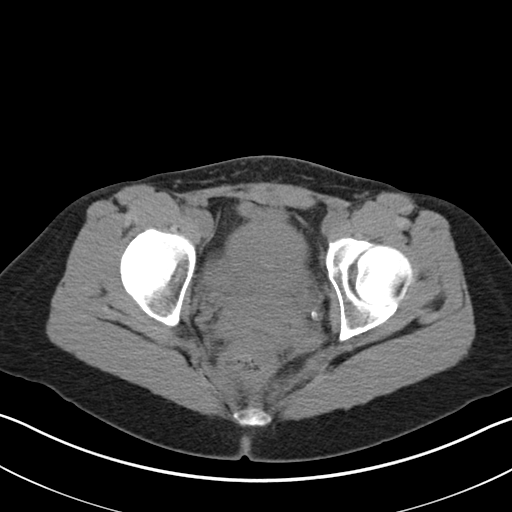
[im 25/90  soft-tissue]
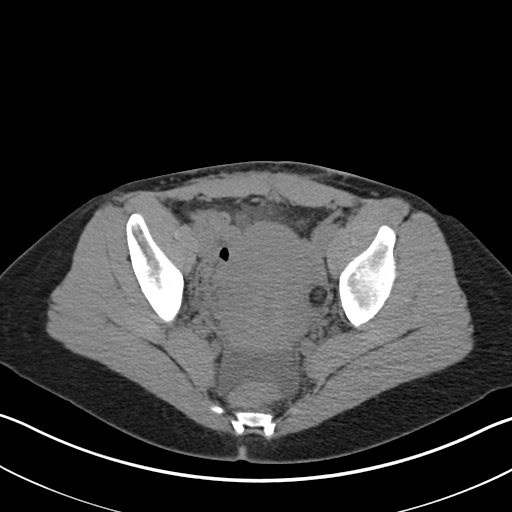
[im 30/90  soft-tissue]
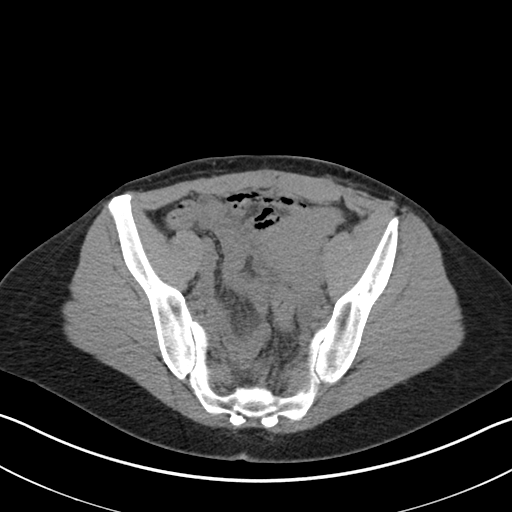
[im 40/90  soft-tissue]
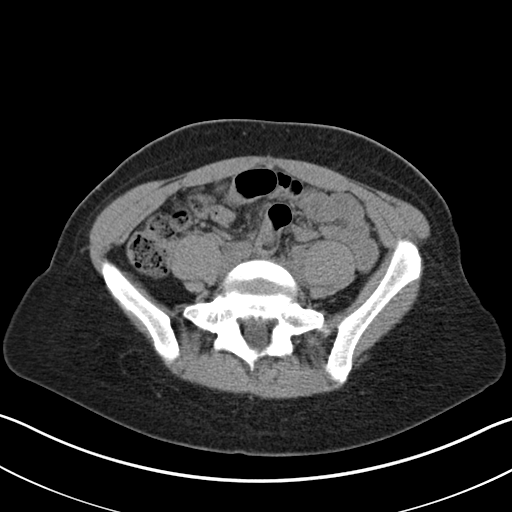
[im 45/90  soft-tissue]
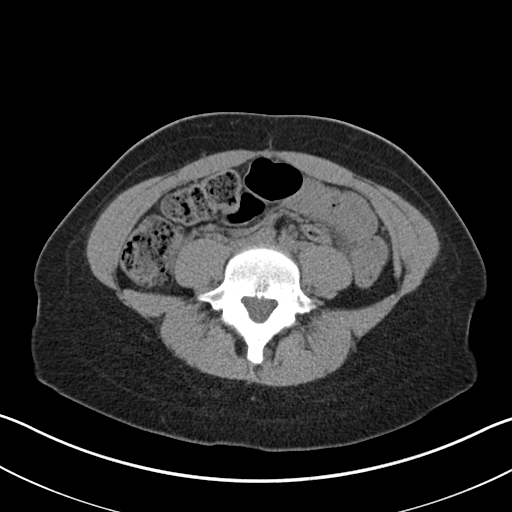
[im 50/90  soft-tissue]
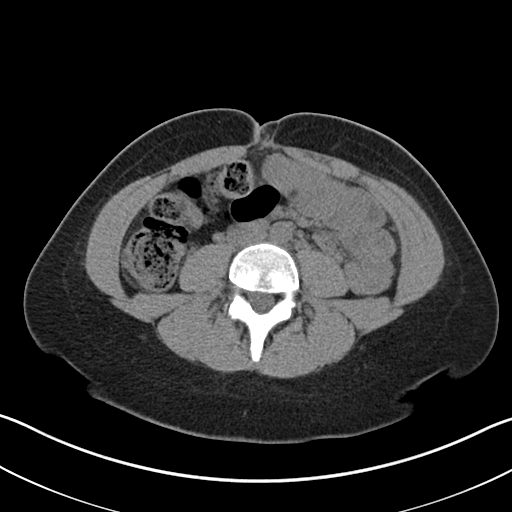
[im 60/90  soft-tissue]
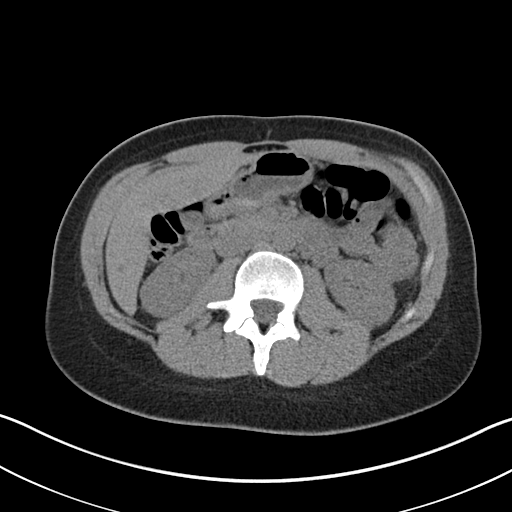
[im 60/90  bone]
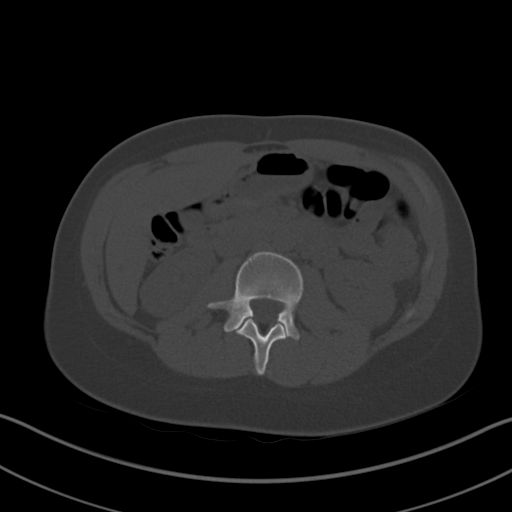
[im 65/90  soft-tissue]
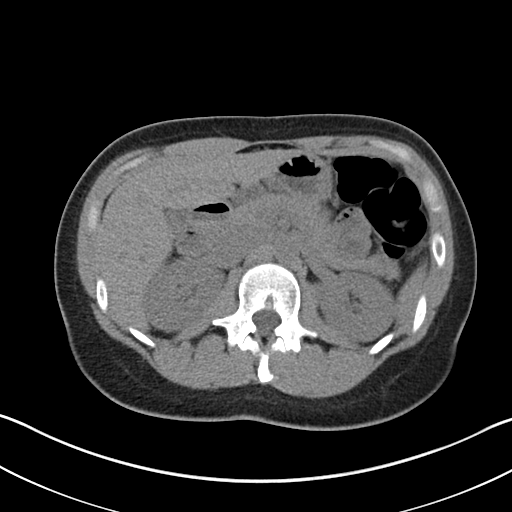
[im 70/90  soft-tissue]
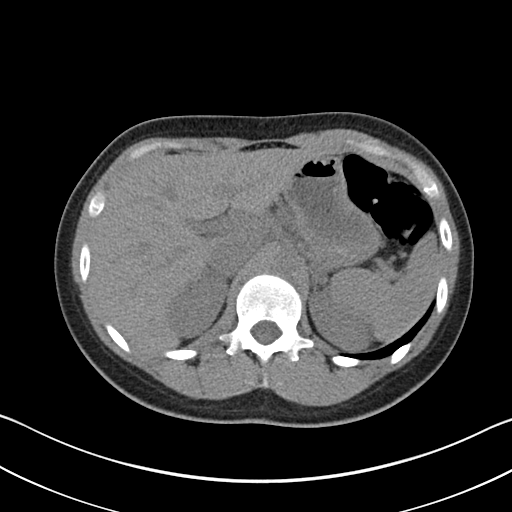
[im 80/90  soft-tissue]
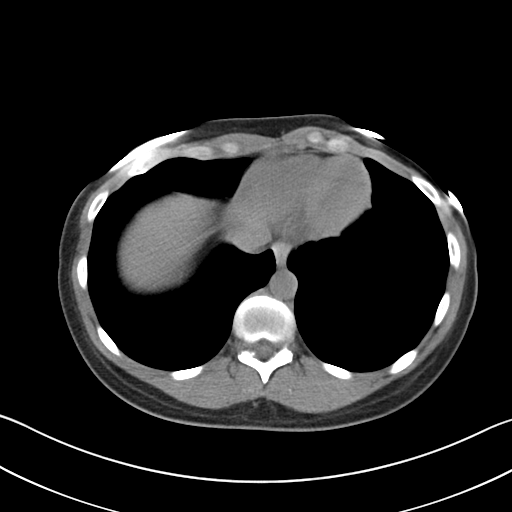
[im 85/90  soft-tissue]
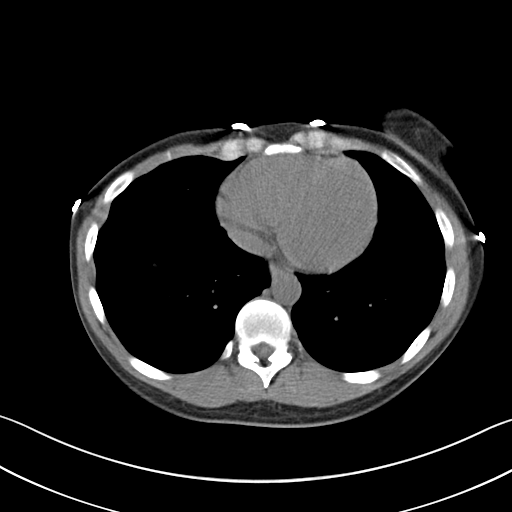

[Series 5: coronal st · coronal · 0.65mm/px · 3 of 130 slices shown]
[im 44/130  soft-tissue]
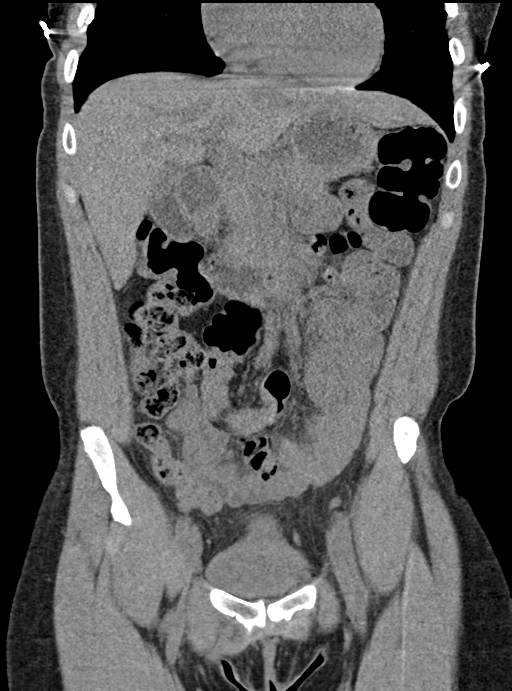
[im 58/130  soft-tissue]
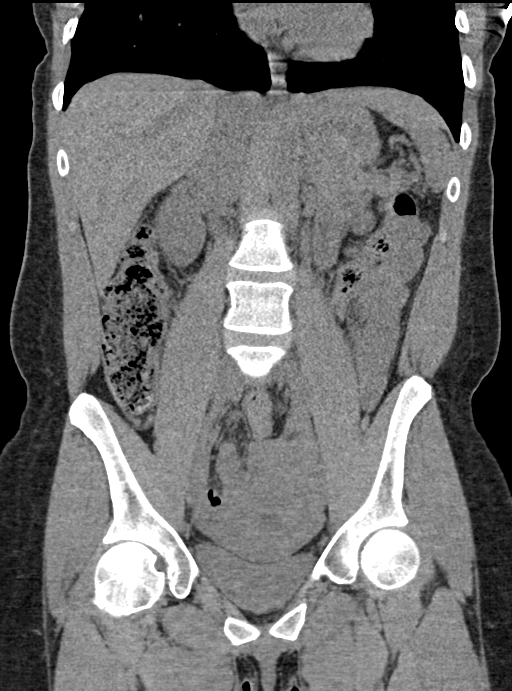
[im 72/130  soft-tissue]
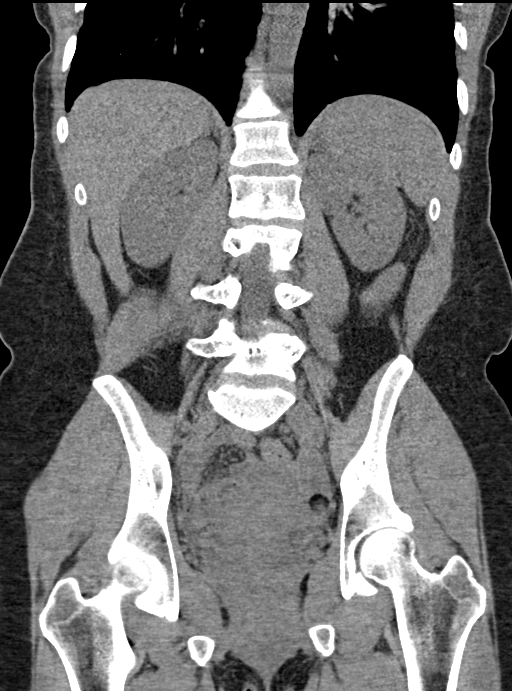

[16 of 46 positions shown; findings below may reference images not displayed]

FINDINGS: Lower chest: No acute abnormality.

Hepatobiliary: No focal liver abnormality is seen. No gallstones,
gallbladder wall thickening, or biliary dilatation.

Pancreas: Unremarkable. No pancreatic ductal dilatation or
surrounding inflammatory changes.

Spleen: Normal in size without focal abnormality.

Adrenals/Urinary Tract: Adrenal glands are unremarkable. Kidneys are
normal, without obstructing renal calculi, focal lesion, or
hydronephrosis. A 2 mm nonobstructing renal stone is seen within the
mid left kidney. Bladder is unremarkable.

Stomach/Bowel: Stomach is within normal limits. Appendix appears
normal. No evidence of bowel wall thickening, distention, or
inflammatory changes.

Vascular/Lymphatic: No significant vascular findings are present. No
enlarged abdominal or pelvic lymph nodes.

Reproductive: Uterus and bilateral adnexa are unremarkable.

Other: The uterus is normal in appearance. A 1.5 cm x 1.1 cm
well-defined area of fat density, soft tissue attenuation and
calcium is seen within the left adnexa (axial CT image 66, CT series
number 2).

A small amount of posterior pelvic free fluid is noted.

Musculoskeletal: No acute or significant osseous findings.
IMPRESSION: 1. 2 mm nonobstructing renal stone within the left kidney.
2. Additional findings consistent with a left ovarian dermoid.

## 2022-09-09 ENCOUNTER — Ambulatory Visit (INDEPENDENT_AMBULATORY_CARE_PROVIDER_SITE_OTHER): Payer: Self-pay | Admitting: Obstetrics & Gynecology

## 2022-09-09 ENCOUNTER — Other Ambulatory Visit (HOSPITAL_COMMUNITY)
Admission: RE | Admit: 2022-09-09 | Discharge: 2022-09-09 | Disposition: A | Discharge status: Home / Self Care | Payer: Self-pay | Source: Ambulatory Visit | Attending: Obstetrics & Gynecology | Admitting: Obstetrics & Gynecology

## 2022-09-09 ENCOUNTER — Encounter: Payer: Self-pay | Admitting: Obstetrics & Gynecology

## 2022-09-09 ENCOUNTER — Other Ambulatory Visit: Payer: Self-pay

## 2022-09-09 VITALS — BP 115/78 | HR 81 | Wt 173.1 lb

## 2022-09-09 DIAGNOSIS — N920 Excessive and frequent menstruation with regular cycle: Secondary | ICD-10-CM

## 2022-09-09 DIAGNOSIS — D500 Iron deficiency anemia secondary to blood loss (chronic): Secondary | ICD-10-CM

## 2022-09-09 DIAGNOSIS — B9689 Other specified bacterial agents as the cause of diseases classified elsewhere: Secondary | ICD-10-CM

## 2022-09-09 DIAGNOSIS — N760 Acute vaginitis: Secondary | ICD-10-CM

## 2022-09-09 DIAGNOSIS — D271 Benign neoplasm of left ovary: Secondary | ICD-10-CM

## 2022-09-09 DIAGNOSIS — G8929 Other chronic pain: Secondary | ICD-10-CM | POA: Insufficient documentation

## 2022-09-09 DIAGNOSIS — R102 Pelvic and perineal pain: Secondary | ICD-10-CM | POA: Insufficient documentation

## 2022-09-09 NOTE — Progress Notes (Signed)
GYNECOLOGY OFFICE VISIT NOTE  History:   Ariana Young is a 37 y.o. G1P1001 here today for pelvic pain and also heavy periods since 05/2022.  History of ELAP, RSO for large right dermoid in 2022, had 1.7 cm left ovarian dermoid at that time that was not resected.   Reports left side pain that has been getting worse. Also reports heavy menstrual periods and passing clots, accompanied by cramping. Periods are regular, last 5 days. Feels very tired, worried she is anemic. She denies any current abnormal vaginal discharge, bleeding or other concerns.    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   Eczema     Past Surgical History:  Procedure Laterality Date   DILATION AND CURETTAGE OF UTERUS N/A 08/18/2020   Procedure: DILATATION AND CURETTAGE;  Surgeon: Hermina Staggers, MD;  Location: [facility] OR;  Service: Gynecology;  Laterality: N/A;   LAPAROTOMY Right 08/18/2020   Procedure: LAPAROTOMY WITH RIGHT SALPINGO-OOPHERECTOMY;  Surgeon: Hermina Staggers, MD;  Location: [facility] OR;  Service: Gynecology;  Laterality: Right;   OVARIAN CYST SURGERY  2008   ELAP, removal of bilateral dermoid cysts   WISDOM TOOTH EXTRACTION      The following portions of the patient's history were reviewed and updated as appropriate: allergies, current medications, past family history, past medical history, past social history, past surgical history and problem list.   Health Maintenance:  Normal pap and negative HRHPV on 07/20/2020.    Review of Systems:  Pertinent items noted in HPI and remainder of comprehensive ROS otherwise negative.  Physical Exam:  BP 115/78   Pulse 81   Wt 173 lb 1.6 oz (78.5 kg)   LMP 08/22/2022 (Exact Date)   BMI 27.94 kg/m  CONSTITUTIONAL: Well-developed, well-nourished female in no acute distress.  HEENT:  Normocephalic, atraumatic. External right and left ear normal. No scleral icterus.  NECK: Normal range of motion,  supple, no masses noted on observation SKIN: No rash noted. Not diaphoretic. No erythema. No pallor. MUSCULOSKELETAL: Normal range of motion. No edema noted. NEUROLOGIC: Alert and oriented to person, place, and time. Normal muscle tone coordination. No cranial nerve deficit noted. PSYCHIATRIC: Normal mood and affect. Normal behavior. Normal judgment and thought content. CARDIOVASCULAR: Normal heart rate noted RESPIRATORY: Effort and breath sounds normal, no problems with respiration noted ABDOMEN: Mild LLQ pain on palpation. No masses noted. No other overt distention noted.   PELVIC: Deferred     Assessment and Plan:    1. Menorrhagia with regular cycle Patient has abnormal uterine bleeding .  Will order abnormal uterine bleeding evaluation labs and pelvic ultrasound to evaluate for any abnormalities.  Will contact patient with these results and plans for further evaluation/management. Recommended OTC Naproxen 500 mg po bid with food during her periods to help with her bleeding and associated cramping.  - US PELVIC COMPLETE WITH TRANSVAGINAL; Future - Cervicovaginal ancillary only - CBC - TSH Rfx on Abnormal to Free T4  2. Dermoid cyst of left ovary 3. Chronic female pelvic pain Will get ultrasound and cervicovaginal testing for evaluation. Naproxen will also help with this pain too.   Will follow up results and manage accordingly. - US PELVIC COMPLETE WITH TRANSVAGINAL; Future - Cervicovaginal ancillary only  Routine preventative health maintenance measures emphasized. Please refer to After Visit Summary for other counseling recommendations.   Return for follow up as recommended.  I spent 30 minutes dedicated to the care of this patient including pre-visit review of records, face to face time with the patient discussing her conditions and treatments and post visit orders.    Jaynie Collins, MD, FACOG Obstetrician & Gynecologist, [facility] for [employer],  [facility]

## 2022-09-10 LAB — CBC
Hematocrit: 34 % (ref 34.0–46.6)
Hemoglobin: 10.8 g/dL — ABNORMAL LOW (ref 11.1–15.9)
MCH: 32.2 pg (ref 26.6–33.0)
MCHC: 31.8 g/dL (ref 31.5–35.7)
MCV: 102 fL — ABNORMAL HIGH (ref 79–97)
Platelets: 163 10*3/uL (ref 150–450)
RBC: 3.35 x10E6/uL — ABNORMAL LOW (ref 3.77–5.28)
RDW: 13 % (ref 11.7–15.4)
WBC: 4.7 10*3/uL (ref 3.4–10.8)

## 2022-09-10 LAB — TSH RFX ON ABNORMAL TO FREE T4: TSH: 0.581 u[IU]/mL (ref 0.450–4.500)

## 2022-09-12 LAB — CERVICOVAGINAL ANCILLARY ONLY
Bacterial Vaginitis (gardnerella): POSITIVE — AB
Candida Glabrata: NEGATIVE
Candida Vaginitis: NEGATIVE
Chlamydia: NEGATIVE
Comment: NEGATIVE
Comment: NEGATIVE
Comment: NEGATIVE
Comment: NEGATIVE
Comment: NEGATIVE
Comment: NORMAL
Neisseria Gonorrhea: NEGATIVE
Trichomonas: NEGATIVE

## 2022-09-12 MED ORDER — METRONIDAZOLE 500 MG PO TABS
500.0000 mg | ORAL_TABLET | Freq: Two times a day (BID) | ORAL | 0 refills | Status: AC
Start: 2022-09-12 — End: 2022-09-19

## 2022-09-12 MED ORDER — FERRIC MALTOL 30 MG PO CAPS: 1.0000 | ORAL_CAPSULE | Freq: Two times a day (BID) | ORAL | 2 refills | Status: AC

## 2022-09-12 NOTE — Addendum Note (Signed)
Addended by: Jaynie Collins A on: 09/12/2022 03:18 PM   Modules accepted: Orders

## 2022-09-12 NOTE — Addendum Note (Signed)
Addended by: Jaynie Collins A on: 09/12/2022 03:13 PM   Modules accepted: Orders

## 2022-09-19 ENCOUNTER — Ambulatory Visit (HOSPITAL_COMMUNITY)
Admission: RE | Admit: 2022-09-19 | Discharge: 2022-09-19 | Disposition: A | Discharge status: Home / Self Care | Payer: Self-pay | Source: Ambulatory Visit | Attending: Obstetrics & Gynecology | Admitting: Obstetrics & Gynecology

## 2022-09-19 DIAGNOSIS — N920 Excessive and frequent menstruation with regular cycle: Secondary | ICD-10-CM | POA: Insufficient documentation

## 2022-09-19 DIAGNOSIS — G8929 Other chronic pain: Secondary | ICD-10-CM | POA: Insufficient documentation

## 2022-09-19 DIAGNOSIS — R102 Pelvic and perineal pain: Secondary | ICD-10-CM | POA: Insufficient documentation

## 2022-09-20 ENCOUNTER — Encounter: Payer: Self-pay | Admitting: Obstetrics & Gynecology

## 2023-02-18 ENCOUNTER — Emergency Department (HOSPITAL_COMMUNITY): Payer: No Typology Code available for payment source

## 2023-02-18 ENCOUNTER — Emergency Department (HOSPITAL_COMMUNITY)
Admission: EM | Admit: 2023-02-18 | Discharge: 2023-02-18 | Disposition: A | Discharge status: Home / Self Care | Payer: No Typology Code available for payment source | Attending: Emergency Medicine | Admitting: Emergency Medicine

## 2023-02-18 ENCOUNTER — Other Ambulatory Visit: Payer: Self-pay

## 2023-02-18 DIAGNOSIS — Y92410 Unspecified street and highway as the place of occurrence of the external cause: Secondary | ICD-10-CM | POA: Insufficient documentation

## 2023-02-18 DIAGNOSIS — Z91010 Allergy to peanuts: Secondary | ICD-10-CM | POA: Diagnosis not present

## 2023-02-18 DIAGNOSIS — M545 Low back pain, unspecified: Secondary | ICD-10-CM | POA: Insufficient documentation

## 2023-02-18 DIAGNOSIS — R519 Headache, unspecified: Secondary | ICD-10-CM | POA: Insufficient documentation

## 2023-02-18 MED ORDER — METHOCARBAMOL 500 MG PO TABS
500.0000 mg | ORAL_TABLET | Freq: Two times a day (BID) | ORAL | 0 refills | Status: AC
Start: 2023-02-18 — End: ?

## 2023-02-18 MED ORDER — KETOROLAC TROMETHAMINE 15 MG/ML IJ SOLN
15.0000 mg | Freq: Once | INTRAMUSCULAR | Status: AC
  Administered 2023-02-18: 15 mg via INTRAMUSCULAR
  Filled 2023-02-18: qty 1

## 2023-02-18 MED ORDER — LIDOCAINE 5 % EX PTCH: 1.0000 | MEDICATED_PATCH | CUTANEOUS | 0 refills | Status: AC

## 2023-02-18 MED ORDER — ETODOLAC 400 MG PO TABS
400.0000 mg | ORAL_TABLET | Freq: Two times a day (BID) | ORAL | 0 refills | Status: AC
Start: 2023-02-18 — End: ?

## 2023-02-18 NOTE — Discharge Instructions (Signed)
CT scan of the head and low back did not show any concerning findings.  Take the few medications that I sent.  Robaxin is a muscle relaxant and will make you drowsy do not do anything dangerous after taking this medication.  Lodine is an anti-inflammatory medication.  Do not combine this with other anti-inflammatory medications.  For any concerning symptoms return to the emergency room.
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
